# Patient Record
Sex: Male | Born: 1940 | Race: White | Hispanic: No | Marital: Married | Smoking: Former smoker
Health system: Southern US, Community
[De-identification: ages and names within clinical notes are randomized; demographics above are authoritative.]

## PROBLEM LIST (undated history)

## (undated) DIAGNOSIS — Z87442 Personal history of urinary calculi: Secondary | ICD-10-CM

## (undated) DIAGNOSIS — F32A Depression, unspecified: Secondary | ICD-10-CM

## (undated) DIAGNOSIS — I219 Acute myocardial infarction, unspecified: Secondary | ICD-10-CM

## (undated) DIAGNOSIS — I723 Aneurysm of iliac artery: Secondary | ICD-10-CM

## (undated) DIAGNOSIS — E785 Hyperlipidemia, unspecified: Secondary | ICD-10-CM

## (undated) DIAGNOSIS — N200 Calculus of kidney: Secondary | ICD-10-CM

## (undated) DIAGNOSIS — I700 Atherosclerosis of aorta: Secondary | ICD-10-CM

## (undated) DIAGNOSIS — I10 Essential (primary) hypertension: Secondary | ICD-10-CM

## (undated) DIAGNOSIS — K635 Polyp of colon: Secondary | ICD-10-CM

## (undated) DIAGNOSIS — K219 Gastro-esophageal reflux disease without esophagitis: Secondary | ICD-10-CM

## (undated) DIAGNOSIS — H8110 Benign paroxysmal vertigo, unspecified ear: Secondary | ICD-10-CM

## (undated) DIAGNOSIS — I2510 Atherosclerotic heart disease of native coronary artery without angina pectoris: Secondary | ICD-10-CM

## (undated) DIAGNOSIS — F329 Major depressive disorder, single episode, unspecified: Secondary | ICD-10-CM

## (undated) DIAGNOSIS — I714 Abdominal aortic aneurysm, without rupture, unspecified: Secondary | ICD-10-CM

## (undated) HISTORY — PX: CERVICAL DISC SURGERY: SHX588

## (undated) HISTORY — PX: KIDNEY STONE SURGERY: SHX686

## (undated) HISTORY — PX: CARDIAC CATHETERIZATION: SHX172

## (undated) HISTORY — PX: SHOULDER ARTHROSCOPY W/ ROTATOR CUFF REPAIR: SHX2400

## (undated) HISTORY — PX: COLON SURGERY: SHX602

---

## 2004-04-29 ENCOUNTER — Ambulatory Visit: Payer: Self-pay | Admitting: Urology

## 2004-05-23 ENCOUNTER — Ambulatory Visit: Payer: Self-pay | Admitting: Urology

## 2005-04-07 ENCOUNTER — Ambulatory Visit: Payer: Self-pay | Admitting: Gastroenterology

## 2006-12-03 ENCOUNTER — Ambulatory Visit: Payer: Self-pay | Admitting: Internal Medicine

## 2006-12-15 ENCOUNTER — Ambulatory Visit: Payer: Self-pay | Admitting: Urology

## 2008-05-31 ENCOUNTER — Ambulatory Visit: Payer: Self-pay | Admitting: Unknown Physician Specialty

## 2010-11-19 ENCOUNTER — Emergency Department: Payer: Self-pay | Admitting: Internal Medicine

## 2011-09-22 ENCOUNTER — Ambulatory Visit: Payer: Self-pay | Admitting: Unknown Physician Specialty

## 2014-09-25 ENCOUNTER — Ambulatory Visit
Admit: 2014-09-25 | Disposition: A | Discharge status: Home / Self Care | Payer: Self-pay | Attending: Unknown Physician Specialty | Admitting: Unknown Physician Specialty

## 2014-09-27 LAB — SURGICAL PATHOLOGY

## 2014-11-06 ENCOUNTER — Encounter
Admission: RE | Admit: 2014-11-06 | Discharge: 2014-11-06 | Disposition: A | Discharge status: Home / Self Care | Payer: PPO | Source: Ambulatory Visit | Attending: Surgery | Admitting: Surgery

## 2014-11-06 DIAGNOSIS — Z01812 Encounter for preprocedural laboratory examination: Secondary | ICD-10-CM | POA: Diagnosis not present

## 2014-11-06 DIAGNOSIS — D122 Benign neoplasm of ascending colon: Secondary | ICD-10-CM | POA: Insufficient documentation

## 2014-11-06 HISTORY — DX: Polyp of colon: K63.5

## 2014-11-06 HISTORY — DX: Calculus of kidney: N20.0

## 2014-11-06 LAB — BASIC METABOLIC PANEL
ANION GAP: 5 (ref 5–15)
BUN: 21 mg/dL — ABNORMAL HIGH (ref 6–20)
CALCIUM: 8.7 mg/dL — AB (ref 8.9–10.3)
CO2: 24 mmol/L (ref 22–32)
Chloride: 112 mmol/L — ABNORMAL HIGH (ref 101–111)
Creatinine, Ser: 0.78 mg/dL (ref 0.61–1.24)
GFR calc Af Amer: >60 mL/min (ref >60)
GFR calc non Af Amer: >60 mL/min (ref >60)
GLUCOSE: 99 mg/dL (ref 65–99)
POTASSIUM: 3.9 mmol/L (ref 3.5–5.1)
Sodium: 141 mmol/L (ref 135–145)

## 2014-11-06 LAB — DIFFERENTIAL
Basophils Absolute: 0.1 10*3/uL (ref 0–0.1)
Basophils Relative: 1 %
EOS ABS: 0.2 10*3/uL (ref 0–0.7)
Eosinophils Relative: 3 %
LYMPHS ABS: 1.8 10*3/uL (ref 1.0–3.6)
Lymphocytes Relative: 24 %
MONO ABS: 0.7 10*3/uL (ref 0.2–1.0)
MONOS PCT: 10 %
Neutro Abs: 4.5 10*3/uL (ref 1.4–6.5)
Neutrophils Relative %: 62 %

## 2014-11-06 LAB — CBC
HCT: 44.7 % (ref 40.0–52.0)
Hemoglobin: 14.3 g/dL (ref 13.0–18.0)
MCH: 29.3 pg (ref 26.0–34.0)
MCHC: 32.1 g/dL (ref 32.0–36.0)
MCV: 91.5 fL (ref 80.0–100.0)
Platelets: 204 10*3/uL (ref 150–440)
RBC: 4.88 MIL/uL (ref 4.40–5.90)
RDW: 15.9 % — AB (ref 11.5–14.5)
WBC: 7.3 10*3/uL (ref 3.8–10.6)

## 2014-11-06 NOTE — Patient Instructions (Addendum)
  Your procedure is scheduled on: Tuesday 11/14/14 Report to Day Surgery. Medical mall Entrance To find out your arrival time please call [phone] between 1PM - 3PM on Monday 11/13/14.  Remember: Instructions that are not followed completely may result in serious medical risk, up to and including death, or upon the discretion of your surgeon and anesthesiologist your surgery may need to be rescheduled.    __x__ 1. Do not eat food or drink liquids after midnight. No gum chewing or hard candies.     __x__ 2. No Alcohol for 24 hours before or after surgery.   ____ 3. Bring all medications with you on the day of surgery if instructed.    __x__ 4. Notify your doctor if there is any change in your medical condition     (cold, fever, infections).     Do not wear jewelry, make-up, hairpins, clips or nail polish.  Do not wear lotions, powders, or perfumes.   Do not shave 48 hours prior to surgery. Men may shave face and neck.  Do not bring valuables to the hospital.    [facility] is not responsible for any belongings or valuables.               Contacts, dentures or bridgework may not be worn into surgery.  Leave your suitcase in the car. After surgery it may be brought to your room.  For patients admitted to the hospital, discharge time is determined by your                treatment team.   Patients discharged the day of surgery will not be allowed to drive home.   Please read over the following fact sheets that you were given:   Surgical Site Infection Prevention   ____ Take these medicines the morning of surgery with A SIP OF WATER:    1.   2.   3.   4.  5.  6.  ____ Fleet Enema (as directed)   __x__ Use CHG Soap as directed  ____ Use inhalers on the day of surgery  ____ Stop metformin 2 days prior to surgery    ____ Take 1/2 of usual insulin dose the night before surgery and none on the morning of surgery.   ____ Stop Coumadin/Plavix/aspirin on Stopped on  11/05/14  ____ Stop Anti-inflammatories on    ____ Stop supplements until after surgery.    ____ Bring C-Pap to the hospital.

## 2014-11-07 NOTE — OR Nursing (Signed)
FAXED EKG FOR REVIEW TO DR Bethena Midget OFFICE AND SPOKE WITH STAFF ABOUT MD REVIEWING EKG

## 2014-11-14 ENCOUNTER — Ambulatory Visit: Payer: PPO | Admitting: Anesthesiology

## 2014-11-14 ENCOUNTER — Encounter
Admission: RE | Disposition: A | Discharge status: Home / Self Care | Payer: Self-pay | Source: Ambulatory Visit | Attending: Surgery

## 2014-11-14 ENCOUNTER — Encounter: Payer: Self-pay | Admitting: *Deleted

## 2014-11-14 ENCOUNTER — Inpatient Hospital Stay
Admission: RE | Admit: 2014-11-14 | Discharge: 2014-11-18 | DRG: 330 | Disposition: A | Discharge status: Home / Self Care | Payer: PPO | Source: Ambulatory Visit | Attending: Surgery | Admitting: Surgery

## 2014-11-14 DIAGNOSIS — R140 Abdominal distension (gaseous): Secondary | ICD-10-CM | POA: Diagnosis present

## 2014-11-14 DIAGNOSIS — Z7982 Long term (current) use of aspirin: Secondary | ICD-10-CM | POA: Diagnosis not present

## 2014-11-14 DIAGNOSIS — Z87891 Personal history of nicotine dependence: Secondary | ICD-10-CM

## 2014-11-14 DIAGNOSIS — Z9889 Other specified postprocedural states: Secondary | ICD-10-CM

## 2014-11-14 DIAGNOSIS — K567 Ileus, unspecified: Secondary | ICD-10-CM | POA: Diagnosis present

## 2014-11-14 DIAGNOSIS — Z8249 Family history of ischemic heart disease and other diseases of the circulatory system: Secondary | ICD-10-CM | POA: Diagnosis not present

## 2014-11-14 DIAGNOSIS — Z8042 Family history of malignant neoplasm of prostate: Secondary | ICD-10-CM

## 2014-11-14 DIAGNOSIS — Z8041 Family history of malignant neoplasm of ovary: Secondary | ICD-10-CM

## 2014-11-14 DIAGNOSIS — Q899 Congenital malformation, unspecified: Secondary | ICD-10-CM

## 2014-11-14 DIAGNOSIS — R109 Unspecified abdominal pain: Secondary | ICD-10-CM

## 2014-11-14 DIAGNOSIS — K635 Polyp of colon: Secondary | ICD-10-CM | POA: Diagnosis present

## 2014-11-14 DIAGNOSIS — D120 Benign neoplasm of cecum: Secondary | ICD-10-CM | POA: Diagnosis present

## 2014-11-14 HISTORY — PX: LAPAROSCOPIC RIGHT COLECTOMY: SHX5925

## 2014-11-14 LAB — CBC
HCT: 43.5 % (ref 40.0–52.0)
Hemoglobin: 13.9 g/dL (ref 13.0–18.0)
MCH: 28.9 pg (ref 26.0–34.0)
MCHC: 32 g/dL (ref 32.0–36.0)
MCV: 90.4 fL (ref 80.0–100.0)
PLATELETS: 181 10*3/uL (ref 150–440)
RBC: 4.81 MIL/uL (ref 4.40–5.90)
RDW: 14.8 % — AB (ref 11.5–14.5)
WBC: 12 10*3/uL — AB (ref 3.8–10.6)

## 2014-11-14 LAB — CREATININE, SERUM
Creatinine, Ser: 1.1 mg/dL (ref 0.61–1.24)
GFR calc Af Amer: >60 mL/min (ref >60)
GFR calc non Af Amer: >60 mL/min (ref >60)

## 2014-11-14 SURGERY — COLECTOMY, RIGHT, LAPAROSCOPIC
Anesthesia: General | Laterality: Right | Wound class: Clean Contaminated

## 2014-11-14 MED ORDER — EPHEDRINE SULFATE 50 MG/ML IJ SOLN
INTRAMUSCULAR | Status: DC | PRN
  Administered 2014-11-14 (×2): 10 mg via INTRAVENOUS

## 2014-11-14 MED ORDER — ACETAMINOPHEN 10 MG/ML IV SOLN
INTRAVENOUS | Status: DC | PRN
  Administered 2014-11-14: 1000 mg via INTRAVENOUS

## 2014-11-14 MED ORDER — DEXAMETHASONE SODIUM PHOSPHATE 10 MG/ML IJ SOLN
INTRAMUSCULAR | Status: DC | PRN
  Administered 2014-11-14: 10 mg via INTRAVENOUS

## 2014-11-14 MED ORDER — DEXTROSE 5 % IV SOLN
2.0000 g | Freq: Once | INTRAVENOUS | Status: AC
  Administered 2014-11-14: 2 g via INTRAVENOUS
  Filled 2014-11-14: qty 2

## 2014-11-14 MED ORDER — PROPOFOL 10 MG/ML IV BOLUS
INTRAVENOUS | Status: DC | PRN
  Administered 2014-11-14: 150 mg via INTRAVENOUS

## 2014-11-14 MED ORDER — MIDAZOLAM HCL 5 MG/5ML IJ SOLN
INTRAMUSCULAR | Status: DC | PRN
  Administered 2014-11-14: 2 mg via INTRAVENOUS

## 2014-11-14 MED ORDER — ACETAMINOPHEN 650 MG RE SUPP: 650.0000 mg | Freq: Four times a day (QID) | RECTAL | Status: DC | PRN

## 2014-11-14 MED ORDER — FENTANYL CITRATE (PF) 100 MCG/2ML IJ SOLN
25.0000 ug | INTRAMUSCULAR | Status: DC | PRN
  Administered 2014-11-14 (×4): 25 ug via INTRAVENOUS

## 2014-11-14 MED ORDER — ONDANSETRON HCL 4 MG/2ML IJ SOLN: 4.0000 mg | Freq: Once | INTRAMUSCULAR | Status: DC | PRN

## 2014-11-14 MED ORDER — HYDROCODONE-ACETAMINOPHEN 5-325 MG PO TABS
1.0000 | ORAL_TABLET | ORAL | Status: DC | PRN
  Administered 2014-11-15 – 2014-11-16 (×5): 2 via ORAL
  Filled 2014-11-14 (×5): qty 2

## 2014-11-14 MED ORDER — FENTANYL CITRATE (PF) 100 MCG/2ML IJ SOLN
INTRAMUSCULAR | Status: AC
  Administered 2014-11-14: 25 ug via INTRAVENOUS
  Filled 2014-11-14: qty 2

## 2014-11-14 MED ORDER — ROCURONIUM BROMIDE 100 MG/10ML IV SOLN
INTRAVENOUS | Status: DC | PRN
  Administered 2014-11-14: 20 mg via INTRAVENOUS
  Administered 2014-11-14: 50 mg via INTRAVENOUS

## 2014-11-14 MED ORDER — KETOROLAC TROMETHAMINE 30 MG/ML IJ SOLN
15.0000 mg | Freq: Four times a day (QID) | INTRAMUSCULAR | Status: AC
  Administered 2014-11-14 – 2014-11-15 (×3): 15 mg via INTRAVENOUS
  Filled 2014-11-14 (×3): qty 1

## 2014-11-14 MED ORDER — HEPARIN SODIUM (PORCINE) 5000 UNIT/ML IJ SOLN
5000.0000 [IU] | Freq: Two times a day (BID) | INTRAMUSCULAR | Status: DC
  Administered 2014-11-14 – 2014-11-17 (×8): 5000 [IU] via SUBCUTANEOUS
  Filled 2014-11-14 (×8): qty 1

## 2014-11-14 MED ORDER — PNEUMOCOCCAL VAC POLYVALENT 25 MCG/0.5ML IJ INJ: 0.5000 mL | INJECTION | INTRAMUSCULAR | Status: DC

## 2014-11-14 MED ORDER — FENTANYL CITRATE (PF) 250 MCG/5ML IJ SOLN
INTRAMUSCULAR | Status: DC | PRN
  Administered 2014-11-14 (×2): 50 ug via INTRAVENOUS

## 2014-11-14 MED ORDER — ACETAMINOPHEN 325 MG PO TABS
650.0000 mg | ORAL_TABLET | Freq: Four times a day (QID) | ORAL | Status: DC | PRN
  Administered 2014-11-16 – 2014-11-17 (×5): 650 mg via ORAL
  Filled 2014-11-14 (×5): qty 2

## 2014-11-14 MED ORDER — LACTATED RINGERS IV SOLN
INTRAVENOUS | Status: DC
  Administered 2014-11-14 (×3): via INTRAVENOUS

## 2014-11-14 MED ORDER — KETOROLAC TROMETHAMINE 30 MG/ML IJ SOLN
30.0000 mg | Freq: Four times a day (QID) | INTRAMUSCULAR | Status: DC
  Administered 2014-11-14: 30 mg via INTRAVENOUS
  Filled 2014-11-14: qty 1

## 2014-11-14 MED ORDER — ONDANSETRON HCL 4 MG/2ML IJ SOLN
INTRAMUSCULAR | Status: DC | PRN
  Administered 2014-11-14: 4 mg via INTRAVENOUS

## 2014-11-14 MED ORDER — LIDOCAINE HCL (CARDIAC) 20 MG/ML IV SOLN
INTRAVENOUS | Status: DC | PRN
  Administered 2014-11-14: 80 mg via INTRAVENOUS

## 2014-11-14 MED ORDER — KCL IN DEXTROSE-NACL 20-5-0.2 MEQ/L-%-% IV SOLN
INTRAVENOUS | Status: DC
  Administered 2014-11-14 – 2014-11-17 (×7): via INTRAVENOUS
  Filled 2014-11-14 (×9): qty 1000

## 2014-11-14 MED ORDER — MORPHINE SULFATE 2 MG/ML IJ SOLN
1.0000 mg | INTRAMUSCULAR | Status: DC | PRN
  Administered 2014-11-14 – 2014-11-15 (×4): 2 mg via INTRAVENOUS
  Filled 2014-11-14 (×4): qty 1

## 2014-11-14 MED ORDER — ACETAMINOPHEN 10 MG/ML IV SOLN
INTRAVENOUS | Status: AC
  Filled 2014-11-14: qty 100

## 2014-11-14 SURGICAL SUPPLY — 58 items
BLADE SURG SZ10 CARB STEEL (BLADE) ×6 IMPLANT
CANISTER SUCT 1200ML W/VALVE (MISCELLANEOUS) ×3 IMPLANT
CANNULA DILATOR 12 W/SLV (CANNULA) ×2 IMPLANT
CANNULA DILATOR 12MM W/SLV (CANNULA) ×1
CATH TRAY 16F METER LATEX (MISCELLANEOUS) IMPLANT
CHLORAPREP W/TINT 26ML (MISCELLANEOUS) ×3 IMPLANT
CLEANER CAUTERY TIP 5X5 PAD (MISCELLANEOUS) ×1 IMPLANT
CLIP TI LARGE 6 (CLIP) IMPLANT
CLIP TI MEDIUM 6 (CLIP) IMPLANT
CLOSURE WOUND 1/2 X4 (GAUZE/BANDAGES/DRESSINGS)
DRAPE LEGGINS SURG 28X43 STRL (DRAPES) IMPLANT
DRAPE UNDER BUTTOCK W/FLU (DRAPES) IMPLANT
DRSG OPSITE POSTOP 4X10 (GAUZE/BANDAGES/DRESSINGS) IMPLANT
DRSG OPSITE POSTOP 4X8 (GAUZE/BANDAGES/DRESSINGS) IMPLANT
GAUZE SPONGE 4X4 12PLY STRL (GAUZE/BANDAGES/DRESSINGS) ×3 IMPLANT
GLOVE BIO SURGEON STRL SZ7.5 (GLOVE) ×24 IMPLANT
GOWN STRL REUS W/ TWL LRG LVL3 (GOWN DISPOSABLE) ×6 IMPLANT
GOWN STRL REUS W/TWL LRG LVL3 (GOWN DISPOSABLE) ×12
HANDLE YANKAUER SUCT BULB TIP (MISCELLANEOUS) ×6 IMPLANT
IV NS 1000ML (IV SOLUTION)
IV NS 1000ML BAXH (IV SOLUTION) IMPLANT
JELLY LUB 2OZ STRL (MISCELLANEOUS)
JELLY LUBE 2OZ STRL (MISCELLANEOUS) IMPLANT
KIT RM TURNOVER STRD PROC AR (KITS) ×3 IMPLANT
LABEL OR SOLS (LABEL) IMPLANT
NDL INSUFF ACCESS 14 VERSASTEP (NEEDLE) ×3 IMPLANT
NS IRRIG 500ML POUR BTL (IV SOLUTION) ×3 IMPLANT
PACK COLON CLEAN CLOSURE (MISCELLANEOUS) ×3 IMPLANT
PACK LAP CHOLECYSTECTOMY (MISCELLANEOUS) ×3 IMPLANT
PAD CLEANER CAUTERY TIP 5X5 (MISCELLANEOUS) ×2
PAD GROUND ADULT SPLIT (MISCELLANEOUS) ×3 IMPLANT
PAD PREP 24X41 OB/GYN DISP (PERSONAL CARE ITEMS) IMPLANT
PENCIL ELECTRO HAND CTR (MISCELLANEOUS) ×6 IMPLANT
RELOAD PROXIMATE 75MM BLUE (ENDOMECHANICALS) ×3 IMPLANT
SCISSORS METZENBAUM CVD 33 (INSTRUMENTS) IMPLANT
SEAL FOR SCOPE WARMER C3101 (MISCELLANEOUS) IMPLANT
SHEARS HARMONIC ACE PLUS 36CM (ENDOMECHANICALS) ×3 IMPLANT
SOL PREP PVP 2OZ (MISCELLANEOUS)
SOLUTION PREP PVP 2OZ (MISCELLANEOUS) IMPLANT
SPONGE LAP 18X18 5 PK (GAUZE/BANDAGES/DRESSINGS) ×3 IMPLANT
STAPLER AUT SUT LDS 15W (STAPLE) IMPLANT
STAPLER GUN LINEAR PROX 60 (STAPLE) ×3 IMPLANT
STAPLER PROXIMATE 75MM BLUE (STAPLE) ×3 IMPLANT
STRIP CLOSURE SKIN 1/2X4 (GAUZE/BANDAGES/DRESSINGS) IMPLANT
SUT CHROMIC 0 SH (SUTURE) ×3 IMPLANT
SUT CHROMIC 3 0 SH 27 (SUTURE) ×6 IMPLANT
SUT ETHILON 4-0 (SUTURE) ×6
SUT ETHILON 4-0 FS2 18XMFL BLK (SUTURE) ×3
SUT MAXON ABS #0 GS21 30IN (SUTURE) ×6 IMPLANT
SUT NYLON 2-0 (SUTURE) IMPLANT
SUT PROLENE 3 0 SH DA (SUTURE) IMPLANT
SUT VIC AB 5-0 RB1 27 (SUTURE) ×3 IMPLANT
SUTURE ETHLN 4-0 FS2 18XMF BLK (SUTURE) ×3 IMPLANT
TROCAR XCEL NON-BLD 11X100MML (ENDOMECHANICALS) ×3 IMPLANT
TROCAR XCEL NON-BLD 5MMX100MML (ENDOMECHANICALS) ×3 IMPLANT
TROCAR XCEL UNIV SLVE 11M 100M (ENDOMECHANICALS) IMPLANT
TUBING INSUFFLATOR HEATED (MISCELLANEOUS) ×3 IMPLANT
WATER STERILE IRR 1000ML POUR (IV SOLUTION) IMPLANT

## 2014-11-14 NOTE — Op Note (Signed)
OPERATIVE REPORT  PREOPERATIVE  DIAGNOSIS: . Colonic polyp  POSTOPERATIVE DIAGNOSIS: . Colonic polyp  PROCEDURE: . Laparoscopic right colectomy  ANESTHESIA:  General  SURGEON: Rochel Brome  MD   INDICATIONS: . This 74 year old male recently had colonoscopy with finding of 2 polyps in the cecum. Biopsy demonstrated tubular adenoma surgery was recommended for definitive treatment  With the patient on the operating table in the supine position he was placed under general endotracheal anesthesia. Abdomen was prepared with clippers and with ChloraPrep and draped in a sterile manner.  A short incision was made below the umbilicus and carried down to the deep fascia which was grasped with a laryngeal hook and elevated. A Veress needle was inserted aspirated and irrigated with a saline solution. The peritoneal cavity was insufflated with carbon dioxide. The Veress needle was removed. The 12 mm cannula was inserted. The 10 mm 0 laparoscope was inserted to view the peritoneal cavity. Initial inspection revealed normal appearance of the liver.  2 additional small incisions were made in the epigastrium to insert 10 mm ports. Another incision was made in the right lower quadrant tenderness a 5 mm port. The graft the right colon was mobilized with incision of the peritoneal reflection using the Harmonic scalpel. The right transverse colon was also dissected and mobilized the duodenum was identified and separated from the transverse mesocolon. The appendix and terminal ileum were also mobilized. After satisfactory mobilization of the right colon the laparoscopic instruments were removed. The right lower quadrant and the infraumbilical port sites were closed with interrupted 4-0 nylon vertical mattress sutures. An incision was made from the uppermost port site to the other epigastric port site and dissection was carried down through subcutaneous tissues and through the midline fascia. The right colon was  delivered up on the abdominal wall. A window was created in the small bowel mesentery just approximately 3 inches proximal to the ileocecal valve and the mesenteric dissection was begun with the Harmonic scalpel. The middle colic vessels were palpated and created a small opening in the mesentery just to the right of the midline and began the mesenteric dissection with the Harmonic scalpel. The mesenteric dissection was continued and dissected out the ileocolic vessels which were suture ligated with 0 chromic and divided with the Harmonic scalpel. The small bowel was brought adjacent to the transverse colon and tented up with Allis clamps an enterotomy was made and also a colotomy made. The anastomosis was begun with introduction of the GIA 75 mm stapler which was engaged and activated along the antimesenteric border. Staple line was hemostatic. The anastomosis was completed with application of the TA 60 stapler which was placed perpendicular to the first staple line engaged and activated. The specimen was excised and passed off to a side table. The staple line was examined 1 bleeding point was ligated with 3-0 chromic. Hemostasis was surgically intact. The mesenteric defect was closed with running 3-0 chromic the apex of the staple line was  imbricated with 5-0 Vicryl and the junction of the staple lines was imbricated with 5-0 Vicryl. The anastomosis was widely patent and was placed back into the peritoneal cavity the right colic gutter was suctioned and found to contain only a small amount of serosanguineous fluid. Hemostasis was intact.  Gloves gowns and instruments and drapes were exchanged for new ones the midline fascia was closed with interrupted 0 Maxon figure-of-eight sutures. Skin incisions were closed with interrupted 4-0 nylon vertical mattress sutures. Dry cotton gauze dressings were applied with  paper tape.  The patient tolerated the procedure satisfactorily and was then prepared for transfer to  the recovery room.  Rochel Brome M.D.

## 2014-11-14 NOTE — Anesthesia Preprocedure Evaluation (Signed)
Anesthesia Evaluation  Patient identified by MRN, date of birth, ID band Patient awake    Reviewed: Allergy & Precautions, NPO status , Patient's Chart, lab work & pertinent test results  History of Anesthesia Complications Negative for: history of anesthetic complications  Airway Mallampati: II  TM Distance: >3 FB Neck ROM: Full    Dental  (+) Upper Dentures, Lower Dentures   Pulmonary former smoker (quit x 20 yrs),          Cardiovascular     Neuro/Psych    GI/Hepatic   Endo/Other    Renal/GU Renal disease (stones)     Musculoskeletal   Abdominal   Peds  Hematology   Anesthesia Other Findings   Reproductive/Obstetrics                             Anesthesia Physical Anesthesia Plan  ASA: II  Anesthesia Plan: General   Post-op Pain Management:    Induction: Intravenous  Airway Management Planned: Oral ETT  Additional Equipment:   Intra-op Plan:   Post-operative Plan:   Informed Consent: I have reviewed the patients History and Physical, chart, labs and discussed the procedure including the risks, benefits and alternatives for the proposed anesthesia with the patient or authorized representative who has indicated his/her understanding and acceptance.     Plan Discussed with:   Anesthesia Plan Comments:         Anesthesia Quick Evaluation

## 2014-11-14 NOTE — Progress Notes (Signed)
Let nurse know 

## 2014-11-14 NOTE — Anesthesia Postprocedure Evaluation (Signed)
  Anesthesia Post-op Note  Patient: Richard Rivas  Procedure(s) Performed: Procedure(s): LAPAROSCOPIC RIGHT COLECTOMY (Right)  Anesthesia type:General  Patient location: PACU  Post pain: Pain level controlled  Post assessment: Post-op Vital signs reviewed, Patient's Cardiovascular Status Stable, Respiratory Function Stable, Patent Airway and No signs of Nausea or vomiting  Post vital signs: Reviewed and stable  Last Vitals:  Filed Vitals:   11/14/14 1148  BP: 129/64  Pulse: 76  Temp: 36.6 C  Resp:     Level of consciousness: awake, alert  and patient cooperative  Complications: No apparent anesthesia complications

## 2014-11-14 NOTE — H&P (Signed)
  He reports no change in condition since the day of the office visit. He did have his bowel preparation and reports good progress. Reviewed his finding of colon polyps. I discussed the plan for right colectomy.  Rochel Brome M.D. 11/14/2014

## 2014-11-14 NOTE — Transfer of Care (Signed)
Immediate Anesthesia Transfer of Care Note  Patient: Richard Rivas  Procedure(s) Performed: Procedure(s): LAPAROSCOPIC RIGHT COLECTOMY (Right)  Patient Location: PACU  Anesthesia Type:General  Level of Consciousness: awake, alert  and oriented  Airway & Oxygen Therapy: Patient Spontanous Breathing and Patient connected to face mask oxygen  Post-op Assessment: Report given to RN and Post -op Vital signs reviewed and stable  Post vital signs: Reviewed and stable  Last Vitals:  Filed Vitals:   11/14/14 1032  BP:   Pulse:   Temp: 36.4 C  Resp:     Complications: No apparent anesthesia complications

## 2014-11-14 NOTE — Anesthesia Procedure Notes (Signed)
Procedure Name: Intubation Date/Time: 11/14/2014 7:42 AM Performed by: Delaney Meigs Pre-anesthesia Checklist: Patient identified, Patient being monitored, Timeout performed, Emergency Drugs available and Suction available Patient Re-evaluated:Patient Re-evaluated prior to inductionOxygen Delivery Method: Circle system utilized Preoxygenation: Pre-oxygenation with 100% oxygen Intubation Type: IV induction Ventilation: Mask ventilation without difficulty Laryngoscope Size: Mac and 3 Grade View: Grade I Tube type: Oral Tube size: 7.5 mm Number of attempts: 1 Airway Equipment and Method: Stylet Placement Confirmation: ETT inserted through vocal cords under direct vision,  positive ETCO2 and breath sounds checked- equal and bilateral Secured at: 21 cm Tube secured with: Tape Dental Injury: Teeth and Oropharynx as per pre-operative assessment

## 2014-11-14 NOTE — Progress Notes (Signed)
He is doing well, up walking, no current pain,  VSS, dressing with scant serosanguinous, discussed surgery and plan of care.  Plan to start sipping clear liquids tonight.

## 2014-11-14 NOTE — Progress Notes (Signed)
Pt admitted from the PACU. Dressings dry and intact. Pt resting well. Given PRN pain meds once. Pt voiding in urinal. Ambulating up in room with assistance. Primary nurse to continue to monitor.

## 2014-11-15 ENCOUNTER — Inpatient Hospital Stay: Payer: PPO

## 2014-11-15 NOTE — Care Management (Signed)
Attempted to meet with this patient but he was having severe pain; RN had been notified per patient's family.

## 2014-11-15 NOTE — Progress Notes (Signed)
I have seen him 3 times a day.  He did start some clear liquids last night.  He developed abdominal pain during the night.  He was treated with morphine.  The pain eventually got better.  This morning he had some abdominal distention and tympany and hyperactive bowel sounds.  His midline incision was with minimal amount of serosanguineous drainage unchanged from last night   He did report passing flatus. He did have another episode of pain which was treated with morphine.  I saw him later in the day and elected to do an x-ray.  I reviewed the x-ray images which were consistent with ileus with gas seen in the small and mostly in the large bowel.  Vital signs noted  On examination this evening his abdomen is softer with less tenderness.  Impression abdominal pain associated with excess gas  Plan is to just take sips of water.  Continue IV fluid.  Continue walking

## 2014-11-16 LAB — SURGICAL PATHOLOGY

## 2014-11-16 NOTE — Progress Notes (Signed)
Patient ID: Richard Rivas, male   DOB: [birth year], 74 y.o.   MRN: [id number] In early afternoon he reported improvement with less pain.  He has been tolerating some clear liquids.  Abdomen is soft and nontender.  Plan is to advance to full liquids for evening meal.  Taper IV.

## 2014-11-16 NOTE — Progress Notes (Signed)
He reports improvement today with less pain.  He has been walking early this morning.  He has been passing gas per rectum.  Vital signs are stable  The abdomen is with minimal distention some tympany soft with no significant tenderness.  His dressings were changed.  His wounds are healing satisfactorily.  Plan is to start clear liquids, continue frequent walking.  He is not yet ready to go home.  Anticipate may need 2 more days in the hospital to demonstrate satisfactory oral intake and bowel function.

## 2014-11-17 NOTE — Progress Notes (Signed)
Patient ID: Richard Rivas, male   DOB: [birth year], 74 y.o.   MRN: [id number] This is postop day 3 after laparoscopic right colectomy.  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  Twelve lymph glands were normal.  He reports progress with oral intake now beginning solid food.  He has had 3 bowel movements this morning.  He is passing flatus.  He is walking in the hallway.  His now taking Tylenol as needed for pain.  This maximal temperature is 99.  On examination his abdomen is soft.  There is minimal tenderness on the right side.  His dressings are dry and intact.  Impressions satisfactory progress 3 days after laparoscopic right colectomy with benign findings of tubular adenomas.  Anticipate he may can be discharged tomorrow.  Resume aspirin upon discharge.  Anticipate he can come to the office  in 7 days for suture removal.  Follow-up in the office with me in approximately 3 weeks.

## 2014-11-17 NOTE — Care Management (Signed)
Diet is being advanced to regular

## 2014-11-17 NOTE — Progress Notes (Addendum)
Initial Nutrition Assessment       INTERVENTION:   (Nutrition Supplement Therapy:) Will add homemade milkshake TID for added nutrition per pt request Meals and snacks: Adding pudding to trays as well  NUTRITION DIAGNOSIS:  Inadequate oral intake related to altered GI function as evidenced by meal completion < 50%.    GOAL:  Patient will meet greater than or equal to 90% of their needs    MONITOR:   (Energy intake, Digestive system)  REASON FOR ASSESSMENT:  NPO/Clear Liquid Diet    ASSESSMENT:  Pt s/p right colectomy.  PMHx:  Past Medical History  Diagnosis Date  . Colon polyps   . Kidney stones     Current Nutrition: taking sips of liquids per wife. Pt in bathroom this am.  Wife requesting homemade milkshake   Food/Nutrition-Related History: Wife reports intake fairly normal prior to admission but several days prior to admission just had liquids for colon cleanse prior to surgery.    Labs:  Electrolyte and Renal Profile:  Recent Labs Lab 11/14/14 1228  CREATININE 1.10    Medications: D5 0.2% NS with KCL at 15ml/hr   Digestive system: noted BM on 6/13, + gas per wife, abdomin minimally distended   Physical Findings:  Unable to complete Nutrition-Focused physical exam at this time. Pt in bathroom on visit this am  Weight Change: Wife reports UBW of 167-168 pounds   Height:  Ht Readings from Last 1 Encounters:  11/14/14 5\' 8"  (1.727 m)    Weight:  Wt Readings from Last 1 Encounters:  11/14/14 170 lb (77.111 kg)     Wt Readings from Last 10 Encounters:  11/14/14 170 lb (77.111 kg)  11/06/14 170 lb (77.111 kg)    BMI:  Body mass index is 25.85 kg/(m^2).  Estimated Nutritional Needs:  Kcal:  BEE 1499 kcals (IF 1.0-1.2, AF 1.3) 0175-1025 kcals/d.   Protein:  (1.0-1.2 g/d) 77-92 g/d  Fluid:  (25-30ml/kg) 1925-2368ml/d  Skin:  Reviewed, no issues  Diet Order:  Diet full liquid Room service appropriate?: Yes; Fluid  consistency:: Thin  EDUCATION NEEDS:  No education needs identified at this time   Intake/Output Summary (Last 24 hours) at 11/17/14 0811 Last data filed at 11/17/14 0500  Gross per 24 hour  Intake   2254 ml  Output   2100 ml  Net    154 ml    MODERATE Care Level  Verdell Dykman B. Zenia Resides, [place], [place] (pager)

## 2014-11-18 LAB — PLATELET COUNT: Platelets: 194 10*3/uL (ref 150–440)

## 2014-11-18 MED ORDER — HYDROCODONE-ACETAMINOPHEN 5-325 MG PO TABS: 1.0000 | ORAL_TABLET | ORAL | Status: DC | PRN

## 2014-11-18 NOTE — Discharge Instructions (Signed)
OK to shower. No lifting over 10 pounds.  No driving until pain free. Use spirometer frequently. Tylenol: If needed for soreness. Norco (hydrocodone): If needed for pain.

## 2014-11-18 NOTE — Progress Notes (Signed)
Pt d/c instructions given. Education provided. Questions answered. IV removed. Escorted out with family.

## 2014-11-27 NOTE — Discharge Summary (Signed)
Discharge summary:   This 74 year old male recently had colonoscopy on September 25, 2014.  Two polyps were found in the cecum wound was small and 1 was partially resected.  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    Details of the history and physical were found on the typed admission history and physical   The patient did have bowel preparation at home.  He came in through the outpatient surgery department and was carried to the operating room and did have a preop prophylactic antibiotic.  Postoperatively he was began on sips of clear liquids.  He did develop some abdominal distention and abdominal pain.  He quickly to increase his walking.  And with time begin to passing gas.  X-ray was consistent with ileus.  Eventually started passing more gas and has abdominal distention resolved.  He was able to take a clear liquid and advanced to full liquids and then to solid food.  He tolerated a solid diet and moved his bowels prior to discharge.    Pathology demonstrated 2 sessile tubular adenomas.  One was 2.1 cm in dimension and the other was 9 mm in dimension.  The 12th regional mesenteric lymph nodes were benign.    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    Operation laparoscopic right colectomy   Discharge instructions include follow up in the office

## 2015-01-01 DEATH — deceased

## 2015-09-10 DIAGNOSIS — Z8041 Family history of malignant neoplasm of ovary: Secondary | ICD-10-CM | POA: Diagnosis not present

## 2015-09-10 DIAGNOSIS — Z86010 Personal history of colonic polyps: Secondary | ICD-10-CM | POA: Diagnosis not present

## 2015-09-17 DIAGNOSIS — Z0000 Encounter for general adult medical examination without abnormal findings: Secondary | ICD-10-CM | POA: Diagnosis not present

## 2015-09-17 DIAGNOSIS — Z125 Encounter for screening for malignant neoplasm of prostate: Secondary | ICD-10-CM | POA: Diagnosis not present

## 2015-10-10 ENCOUNTER — Encounter: Payer: Self-pay | Admitting: *Deleted

## 2015-10-11 ENCOUNTER — Encounter: Payer: Self-pay | Admitting: Anesthesiology

## 2015-10-11 ENCOUNTER — Ambulatory Visit: Payer: PPO | Admitting: Anesthesiology

## 2015-10-11 ENCOUNTER — Encounter
Admission: RE | Disposition: A | Discharge status: Home / Self Care | Payer: Self-pay | Source: Ambulatory Visit | Attending: Unknown Physician Specialty

## 2015-10-11 ENCOUNTER — Ambulatory Visit
Admission: RE | Admit: 2015-10-11 | Discharge: 2015-10-11 | Disposition: A | Discharge status: Home / Self Care | Payer: PPO | Source: Ambulatory Visit | Attending: Unknown Physician Specialty | Admitting: Unknown Physician Specialty

## 2015-10-11 DIAGNOSIS — Z7982 Long term (current) use of aspirin: Secondary | ICD-10-CM | POA: Insufficient documentation

## 2015-10-11 DIAGNOSIS — Z87891 Personal history of nicotine dependence: Secondary | ICD-10-CM | POA: Insufficient documentation

## 2015-10-11 DIAGNOSIS — Z91030 Bee allergy status: Secondary | ICD-10-CM | POA: Diagnosis not present

## 2015-10-11 DIAGNOSIS — Z86010 Personal history of colonic polyps: Secondary | ICD-10-CM | POA: Insufficient documentation

## 2015-10-11 DIAGNOSIS — N402 Nodular prostate without lower urinary tract symptoms: Secondary | ICD-10-CM | POA: Diagnosis not present

## 2015-10-11 DIAGNOSIS — D123 Benign neoplasm of transverse colon: Secondary | ICD-10-CM | POA: Insufficient documentation

## 2015-10-11 DIAGNOSIS — H8110 Benign paroxysmal vertigo, unspecified ear: Secondary | ICD-10-CM | POA: Diagnosis not present

## 2015-10-11 DIAGNOSIS — K635 Polyp of colon: Secondary | ICD-10-CM | POA: Diagnosis not present

## 2015-10-11 DIAGNOSIS — K621 Rectal polyp: Secondary | ICD-10-CM | POA: Diagnosis not present

## 2015-10-11 DIAGNOSIS — Z1211 Encounter for screening for malignant neoplasm of colon: Secondary | ICD-10-CM | POA: Diagnosis not present

## 2015-10-11 DIAGNOSIS — K648 Other hemorrhoids: Secondary | ICD-10-CM | POA: Diagnosis not present

## 2015-10-11 DIAGNOSIS — K640 First degree hemorrhoids: Secondary | ICD-10-CM | POA: Diagnosis not present

## 2015-10-11 DIAGNOSIS — Z87442 Personal history of urinary calculi: Secondary | ICD-10-CM | POA: Diagnosis not present

## 2015-10-11 DIAGNOSIS — Z980 Intestinal bypass and anastomosis status: Secondary | ICD-10-CM | POA: Diagnosis not present

## 2015-10-11 HISTORY — PX: COLONOSCOPY WITH PROPOFOL: SHX5780

## 2015-10-11 HISTORY — DX: Benign paroxysmal vertigo, unspecified ear: H81.10

## 2015-10-11 SURGERY — COLONOSCOPY WITH PROPOFOL
Anesthesia: General

## 2015-10-11 MED ORDER — PHENYLEPHRINE HCL 10 MG/ML IJ SOLN
INTRAMUSCULAR | Status: DC | PRN
  Administered 2015-10-11: 50 ug via INTRAVENOUS

## 2015-10-11 MED ORDER — EPHEDRINE SULFATE 50 MG/ML IJ SOLN
INTRAMUSCULAR | Status: DC | PRN
  Administered 2015-10-11: 5 mg via INTRAVENOUS

## 2015-10-11 MED ORDER — SODIUM CHLORIDE 0.9 % IV SOLN: INTRAVENOUS | Status: DC

## 2015-10-11 MED ORDER — MIDAZOLAM HCL 5 MG/5ML IJ SOLN
INTRAMUSCULAR | Status: DC | PRN
  Administered 2015-10-11: 1 mg via INTRAVENOUS

## 2015-10-11 MED ORDER — PROPOFOL 10 MG/ML IV BOLUS
INTRAVENOUS | Status: DC | PRN
  Administered 2015-10-11: 60 mg via INTRAVENOUS

## 2015-10-11 MED ORDER — FENTANYL CITRATE (PF) 100 MCG/2ML IJ SOLN
INTRAMUSCULAR | Status: DC | PRN
  Administered 2015-10-11: 50 ug via INTRAVENOUS

## 2015-10-11 MED ORDER — PROPOFOL 500 MG/50ML IV EMUL
INTRAVENOUS | Status: DC | PRN
  Administered 2015-10-11: 120 ug/kg/min via INTRAVENOUS

## 2015-10-11 MED ORDER — LIDOCAINE HCL (CARDIAC) 20 MG/ML IV SOLN
INTRAVENOUS | Status: DC | PRN
  Administered 2015-10-11: 60 mg via INTRAVENOUS

## 2015-10-11 MED ORDER — SODIUM CHLORIDE 0.9 % IV SOLN
INTRAVENOUS | Status: DC
  Administered 2015-10-11: 1000 mL via INTRAVENOUS

## 2015-10-11 NOTE — Transfer of Care (Signed)
Immediate Anesthesia Transfer of Care Note  Patient: Richard Rivas  Procedure(s) Performed: Procedure(s): COLONOSCOPY WITH PROPOFOL (N/A)  Patient Location: PACU  Anesthesia Type:General  Level of Consciousness: sedated  Airway & Oxygen Therapy: Patient Spontanous Breathing and Patient connected to nasal cannula oxygen  Post-op Assessment: Report given to RN  Post vital signs: Reviewed and stable  Last Vitals:  Filed Vitals:   10/11/15 1340 10/11/15 1544  BP: 124/75 85/59  Pulse: 78 49  Temp: 35.8 C 97.42F  Resp: 20 16    Last Pain: There were no vitals filed for this visit.       Complications: No apparent anesthesia complications

## 2015-10-11 NOTE — Op Note (Signed)
[facility] Gastroenterology Patient Name: Richard Rivas Procedure Date: 10/11/2015 3:09 PM MRN: [id number] Account #: [id number] Date of Birth: 05-Mar-1941 Admit Type: Outpatient Age: 75 Room: [facility] ENDO ROOM 1 Gender: Male Note Status: Finalized Procedure:            Colonoscopy Indications:          High risk colon cancer surveillance: Personal history                        of colonic polyps Providers:            Manya Silvas, MD Referring MD:         Rusty Aus, MD (Referring MD) Medicines:            Propofol per Anesthesia Complications:        No immediate complications. Procedure:            Pre-Anesthesia Assessment:                       - After reviewing the risks and benefits, the patient                        was deemed in satisfactory condition to undergo the                        procedure.                       After obtaining informed consent, the colonoscope was                        passed under direct vision. Throughout the procedure,                        the patient's blood pressure, pulse, and oxygen                        saturations were monitored continuously. The                        Colonoscope was introduced through the anus and                        advanced to the the ileocolonic anastomosis. The                        colonoscopy was performed without difficulty. The                        patient tolerated the procedure well. The quality of                        the bowel preparation was good. Findings:      The anastamosis looks great with wide open junction.      A diminutive polyp was found in the transverse colon. The polyp was       sessile. The polyp was removed with a jumbo cold forceps. Resection and       retrieval were complete.      Two sessile polyps were found in the rectum. The polyps were diminutive       in size.  These polyps were removed with a jumbo cold forceps. Resection       and retrieval were  complete.      Internal hemorrhoids were found during endoscopy. The hemorrhoids were       small and Grade I (internal hemorrhoids that do not prolapse).      The exam was otherwise without abnormality. Impression:           - One diminutive polyp in the transverse colon, removed                        with a jumbo cold forceps. Resected and retrieved.                       - Two diminutive polyps in the rectum, removed with a                        jumbo cold forceps. Resected and retrieved.                       - Internal hemorrhoids.                       - The examination was otherwise normal. Recommendation:       - Await pathology results. Manya Silvas, MD 10/11/2015 3:38:42 PM This report has been signed electronically. Number of Addenda: 0 Note Initiated On: 10/11/2015 3:09 PM Scope Withdrawal Time: 0 hours 14 minutes 20 seconds  Total Procedure Duration: 0 hours 20 minutes 37 seconds       [facility]

## 2015-10-11 NOTE — Anesthesia Preprocedure Evaluation (Signed)
Anesthesia Evaluation  Patient identified by MRN, date of birth, ID band Patient awake    Reviewed: Allergy & Precautions, NPO status , Patient's Chart, lab work & pertinent test results  History of Anesthesia Complications Negative for: history of anesthetic complications  Airway Mallampati: I  TM Distance: >3 FB Neck ROM: Full    Dental  (+) Upper Dentures, Lower Dentures   Pulmonary neg pulmonary ROS, former smoker,           Cardiovascular Exercise Tolerance: Good negative cardio ROS       Neuro/Psych negative neurological ROS     GI/Hepatic negative GI ROS, Neg liver ROS,   Endo/Other  negative endocrine ROS  Renal/GU Renal disease (stones)  negative genitourinary   Musculoskeletal   Abdominal   Peds  Hematology negative hematology ROS (+)   Anesthesia Other Findings Past Medical History:   Colon polyps                                                 Kidney stones                                                BPPV (benign paroxysmal positional vertigo)                  Reproductive/Obstetrics negative OB ROS                             Anesthesia Physical  Anesthesia Plan  ASA: II  Anesthesia Plan: General   Post-op Pain Management:    Induction: Intravenous  Airway Management Planned: Oral ETT  Additional Equipment:   Intra-op Plan:   Post-operative Plan:   Informed Consent: I have reviewed the patients History and Physical, chart, labs and discussed the procedure including the risks, benefits and alternatives for the proposed anesthesia with the patient or authorized representative who has indicated his/her understanding and acceptance.     Plan Discussed with: Anesthesiologist, CRNA and Surgeon  Anesthesia Plan Comments:         Anesthesia Quick Evaluation

## 2015-10-11 NOTE — H&P (Signed)
   Primary Care Physician:  Rusty Aus, MD Primary Gastroenterologist:  Dr. Vira Agar  Pre-Procedure History & Physical: HPI:  Richard Rivas is a 75 y.o. male is here for an colonoscopy.   Past Medical History  Diagnosis Date  . Colon polyps   . Kidney stones   . BPPV (benign paroxysmal positional vertigo)     Past Surgical History  Procedure Laterality Date  . Shoulder arthroscopy w/ rotator cuff repair Right   . Kidney stone surgery    . Cervical disc surgery    . Laparoscopic right colectomy Right 11/14/2014    Procedure: LAPAROSCOPIC RIGHT COLECTOMY;  Surgeon: Leonie Green, MD;  Location: [facility] ORS;  Service: General;  Laterality: Right;    Prior to Admission medications   Medication Sig Start Date End Date Taking? Authorizing Provider  aspirin EC 81 MG tablet Take by mouth daily. 2 tabs daily    Historical Provider, MD    Allergies as of 10/01/2015 - Review Complete 11/14/2014  Allergen Reaction Noted  . Bee venom Swelling 11/06/2014    History reviewed. No pertinent family history.  Social History   Social History  . Marital Status: Married    Spouse Name: N/A  . Number of Children: N/A  . Years of Education: N/A   Occupational History  . Not on file.   Social History Main Topics  . Smoking status: Former Smoker    Quit date: 11/06/1994  . Smokeless tobacco: Former Systems developer    Quit date: 11/05/2012  . Alcohol Use: Yes     Comment: beer occasionally  . Drug Use: No  . Sexual Activity: Not on file   Other Topics Concern  . Not on file   Social History Narrative    Review of Systems: See HPI, otherwise negative ROS  Physical Exam: BP 124/75 mmHg  Pulse 78  Temp(Src) 96.5 F (35.8 C) (Tympanic)  Resp 20  Ht 5\' 8"  (1.727 m)  Wt 74.844 kg (165 lb)  BMI 25.09 kg/m2  SpO2 97% General:   Alert,  pleasant and cooperative in NAD Head:  Normocephalic and atraumatic. Neck:  Supple; no masses or thyromegaly. Lungs:  Clear throughout to  auscultation.    Heart:  Regular rate and rhythm. Abdomen:  Soft, nontender and nondistended. Normal bowel sounds, without guarding, and without rebound.   Neurologic:  Alert and  oriented x4;  grossly normal neurologically.  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  Questions have been answered.  All parties agreeable.   Gaylyn Cheers, MD  10/11/2015, 3:09 PM

## 2015-10-11 NOTE — Anesthesia Postprocedure Evaluation (Signed)
Anesthesia Post Note  Patient: Richard Rivas  Procedure(s) Performed: Procedure(s) (LRB): COLONOSCOPY WITH PROPOFOL (N/A)  Patient location during evaluation: Endoscopy Anesthesia Type: General Level of consciousness: awake and alert Pain management: pain level controlled Vital Signs Assessment: post-procedure vital signs reviewed and stable Respiratory status: spontaneous breathing and respiratory function stable Cardiovascular status: stable Anesthetic complications: no    Last Vitals:  Filed Vitals:   10/11/15 1544 10/11/15 1550  BP: 85/59 91/55  Pulse: 49 54  Temp:    Resp: 16 19    Last Pain: There were no vitals filed for this visit.               Ieisha Gao K

## 2015-10-13 ENCOUNTER — Encounter: Payer: Self-pay | Admitting: Unknown Physician Specialty

## 2015-10-15 LAB — SURGICAL PATHOLOGY

## 2015-10-24 ENCOUNTER — Other Ambulatory Visit: Payer: Self-pay | Admitting: Internal Medicine

## 2015-10-24 DIAGNOSIS — N429 Disorder of prostate, unspecified: Secondary | ICD-10-CM | POA: Diagnosis not present

## 2015-10-24 DIAGNOSIS — R1032 Left lower quadrant pain: Secondary | ICD-10-CM

## 2015-10-24 DIAGNOSIS — R1084 Generalized abdominal pain: Secondary | ICD-10-CM | POA: Diagnosis not present

## 2015-10-24 DIAGNOSIS — L570 Actinic keratosis: Secondary | ICD-10-CM | POA: Diagnosis not present

## 2015-10-26 ENCOUNTER — Ambulatory Visit: Payer: PPO

## 2015-10-30 ENCOUNTER — Ambulatory Visit
Admission: RE | Admit: 2015-10-30 | Discharge: 2015-10-30 | Disposition: A | Discharge status: Home / Self Care | Payer: PPO | Source: Ambulatory Visit | Attending: Internal Medicine | Admitting: Internal Medicine

## 2015-10-30 DIAGNOSIS — R1084 Generalized abdominal pain: Secondary | ICD-10-CM | POA: Diagnosis not present

## 2015-10-30 DIAGNOSIS — R1032 Left lower quadrant pain: Secondary | ICD-10-CM | POA: Diagnosis not present

## 2015-10-30 MED ORDER — IOPAMIDOL (ISOVUE-300) INJECTION 61%
80.0000 mL | Freq: Once | INTRAVENOUS | Status: AC | PRN
  Administered 2015-10-30: 80 mL via INTRAVENOUS

## 2015-10-31 DIAGNOSIS — I700 Atherosclerosis of aorta: Secondary | ICD-10-CM | POA: Insufficient documentation

## 2015-12-10 DIAGNOSIS — N402 Nodular prostate without lower urinary tract symptoms: Secondary | ICD-10-CM | POA: Diagnosis not present

## 2015-12-10 DIAGNOSIS — N400 Enlarged prostate without lower urinary tract symptoms: Secondary | ICD-10-CM | POA: Diagnosis not present

## 2016-02-01 DIAGNOSIS — G44229 Chronic tension-type headache, not intractable: Secondary | ICD-10-CM | POA: Diagnosis not present

## 2016-02-01 DIAGNOSIS — K2900 Acute gastritis without bleeding: Secondary | ICD-10-CM | POA: Diagnosis not present

## 2016-06-16 DIAGNOSIS — N401 Enlarged prostate with lower urinary tract symptoms: Secondary | ICD-10-CM | POA: Diagnosis not present

## 2016-06-23 DIAGNOSIS — N400 Enlarged prostate without lower urinary tract symptoms: Secondary | ICD-10-CM | POA: Diagnosis not present

## 2016-06-23 DIAGNOSIS — N402 Nodular prostate without lower urinary tract symptoms: Secondary | ICD-10-CM | POA: Diagnosis not present

## 2016-07-17 DIAGNOSIS — H2513 Age-related nuclear cataract, bilateral: Secondary | ICD-10-CM | POA: Diagnosis not present

## 2016-09-17 ENCOUNTER — Encounter: Payer: Self-pay | Admitting: General Surgery

## 2016-09-17 DIAGNOSIS — Z79899 Other long term (current) drug therapy: Secondary | ICD-10-CM | POA: Diagnosis not present

## 2016-09-17 DIAGNOSIS — L570 Actinic keratosis: Secondary | ICD-10-CM | POA: Diagnosis not present

## 2016-09-17 DIAGNOSIS — Z125 Encounter for screening for malignant neoplasm of prostate: Secondary | ICD-10-CM | POA: Diagnosis not present

## 2016-09-17 DIAGNOSIS — Z0000 Encounter for general adult medical examination without abnormal findings: Secondary | ICD-10-CM | POA: Diagnosis not present

## 2016-09-17 DIAGNOSIS — I700 Atherosclerosis of aorta: Secondary | ICD-10-CM | POA: Diagnosis not present

## 2016-09-30 ENCOUNTER — Ambulatory Visit (INDEPENDENT_AMBULATORY_CARE_PROVIDER_SITE_OTHER): Payer: PPO | Admitting: General Surgery

## 2016-09-30 ENCOUNTER — Encounter: Payer: Self-pay | Admitting: General Surgery

## 2016-09-30 VITALS — BP 132/68 | HR 72 | Resp 14 | Ht 68.0 in | Wt 174.0 lb

## 2016-09-30 DIAGNOSIS — D2111 Benign neoplasm of connective and other soft tissue of right upper limb, including shoulder: Secondary | ICD-10-CM | POA: Diagnosis not present

## 2016-09-30 DIAGNOSIS — D171 Benign lipomatous neoplasm of skin and subcutaneous tissue of trunk: Secondary | ICD-10-CM | POA: Diagnosis not present

## 2016-09-30 DIAGNOSIS — D1721 Benign lipomatous neoplasm of skin and subcutaneous tissue of right arm: Secondary | ICD-10-CM

## 2016-09-30 MED ORDER — HYDROCODONE-ACETAMINOPHEN 5-325 MG PO TABS: 1.0000 | ORAL_TABLET | ORAL | 0 refills | Status: DC | PRN

## 2016-09-30 NOTE — Progress Notes (Signed)
Patient ID: JOH Rivas, male   DOB: 26-Feb-1941, 76 y.o.   MRN: [id number]  Chief Complaint  Patient presents with  . Lipoma    HPI Richard Rivas is a 76 y.o. male here today for a evaluation of a lipoma on his upper back. He noticed this a year ago. In the last six months he states the area has got bigger. No pain but some discomfort  when he sleeps.  Wife, Richard Rivas is present. Married 19 years. HPI  Past Medical History:  Diagnosis Date  . BPPV (benign paroxysmal positional vertigo)   . Colon polyps   . Kidney stones     Past Surgical History:  Procedure Laterality Date  . CERVICAL DISC SURGERY    . COLONOSCOPY WITH PROPOFOL N/A 10/11/2015   Procedure: COLONOSCOPY WITH PROPOFOL;  Surgeon: Manya Silvas, MD;  Location: [facility] ENDOSCOPY;  Service: Endoscopy;  Laterality: N/A;  . KIDNEY STONE SURGERY    . LAPAROSCOPIC RIGHT COLECTOMY Right 11/14/2014   Procedure: LAPAROSCOPIC RIGHT COLECTOMY;  Surgeon: Leonie Green, MD;  Location: [facility] ORS;  Service: General;  Laterality: Right;  . SHOULDER ARTHROSCOPY W/ ROTATOR CUFF REPAIR Right     History reviewed. No pertinent family history.  Social History Social History  Substance Use Topics  . Smoking status: Former Smoker    Quit date: 11/06/1994  . Smokeless tobacco: Former Systems developer    Quit date: 11/05/2012  . Alcohol use Yes     Comment: beer occasionally    Allergies  Allergen Reactions  . Bee Venom Swelling    Current Outpatient Prescriptions  Medication Sig Dispense Refill  . aspirin EC 81 MG tablet Take by mouth daily. 2 tabs daily    . FLUoxetine (PROZAC) 20 MG tablet     . lisinopril (PRINIVIL,ZESTRIL) 10 MG tablet     . HYDROcodone-acetaminophen (NORCO) 5-325 MG tablet Take 1-2 tablets by mouth every 4 (four) hours as needed for moderate pain. 20 tablet 0   No current facility-administered medications for this visit.     Review of Systems Review of Systems  Constitutional: Negative.   Respiratory:  Negative.   Cardiovascular: Negative.     Blood pressure 132/68, pulse 72, resp. rate 14, height 5\' 8"  (1.727 m), weight 174 lb (78.9 kg).  Physical Exam Physical Exam  Constitutional: He is oriented to person, place, and time. He appears well-developed and well-nourished.  Cardiovascular: Normal rate, regular rhythm and normal heart sounds.   Pulmonary/Chest: Effort normal and breath sounds normal.      Neurological: He is alert and oriented to person, place, and time.  Skin: Skin is warm and dry.       Assessment    Symptomatic lipoma right posterior shoulder.    Plan    Opportunity for excision was reviewed. The patient was amenable to proceed. The skin was cleansed with ChloraPrep and 20 mL of 0.5% Xylocaine with 0.25% Marcaine with 1-200,000 epinephrine was utilized well tolerated. The area was re-prepped with ChloraPrep and draped. A transverse incision over the mass was made and carried at the skin and subcutaneous tissue. A multilobulated mass consistent with a lipoma was identified. This extended down to but did not file 8 the deep fascia. A single feeding vessel at this level was controlled with a 3-0 Vicryl figure-of-eight suture. The deep adipose layer was approximated with a running 3-0 Vicryl suture with to tethering sutures to the deep fascia to obliterate dead space. The skin was closed with  a running 3-0 Vicryl septic suture. Benzoin, Steri-Strips followed by Telfa and Tegaderm dressing applied.  The patient tolerated the excision well. Wound care instructions reviewed. Tegaderm dressing to be removed in 3 days. Office follow-up if needed. He'll be contacted when pathology is available.     HPI, Physical Exam, Assessment and Plan have been scribed under the direction and in the presence of Hervey Ard, MD.  Gaspar Cola, CMA  I have completed the exam and reviewed the above documentation for accuracy and completeness.  I agree with the above.  Development worker, community has been used and any errors in dictation or transcription are unintentional.  Hervey Ard, M.D., F.A.C.S.  Richard Rivas 09/30/2016, 5:24 PM

## 2016-10-03 ENCOUNTER — Telehealth: Payer: Self-pay

## 2016-10-03 NOTE — Telephone Encounter (Signed)
Notified patient as instructed, patient pleased. Discussed follow-up appointments, patient agrees  

## 2016-10-03 NOTE — Telephone Encounter (Signed)
-----   Message from [name], MD sent at 10/02/2016  8:10 PM EDT ----- Please notify the patient pathology showed a benign fatty tumor. See if he looked at the [employer]. Thanks.  ----- Message ----- From: Interface, Lab In [facility] Seven Sent: 10/02/2016   4:53 PM To: [name], MD

## 2016-12-15 DIAGNOSIS — N402 Nodular prostate without lower urinary tract symptoms: Secondary | ICD-10-CM | POA: Diagnosis not present

## 2016-12-18 DIAGNOSIS — E538 Deficiency of other specified B group vitamins: Secondary | ICD-10-CM | POA: Diagnosis not present

## 2016-12-18 DIAGNOSIS — Z79899 Other long term (current) drug therapy: Secondary | ICD-10-CM | POA: Diagnosis not present

## 2016-12-22 ENCOUNTER — Other Ambulatory Visit: Payer: Self-pay | Admitting: Urology

## 2016-12-22 DIAGNOSIS — N402 Nodular prostate without lower urinary tract symptoms: Secondary | ICD-10-CM | POA: Diagnosis not present

## 2016-12-22 DIAGNOSIS — N400 Enlarged prostate without lower urinary tract symptoms: Secondary | ICD-10-CM | POA: Diagnosis not present

## 2017-01-12 ENCOUNTER — Ambulatory Visit (HOSPITAL_COMMUNITY)
Admission: RE | Admit: 2017-01-12 | Discharge: 2017-01-12 | Disposition: A | Discharge status: Home / Self Care | Payer: PPO | Source: Ambulatory Visit | Attending: Urology | Admitting: Urology

## 2017-01-12 DIAGNOSIS — N402 Nodular prostate without lower urinary tract symptoms: Secondary | ICD-10-CM | POA: Insufficient documentation

## 2017-01-12 LAB — POCT I-STAT CREATININE: CREATININE: 0.8 mg/dL (ref 0.61–1.24)

## 2017-01-12 MED ORDER — GADOBENATE DIMEGLUMINE 529 MG/ML IV SOLN
20.0000 mL | Freq: Once | INTRAVENOUS | Status: AC | PRN
  Administered 2017-01-12: 16 mL via INTRAVENOUS

## 2017-01-31 DIAGNOSIS — I219 Acute myocardial infarction, unspecified: Secondary | ICD-10-CM

## 2017-01-31 HISTORY — DX: Acute myocardial infarction, unspecified: I21.9

## 2017-02-11 ENCOUNTER — Inpatient Hospital Stay
Admission: EM | Admit: 2017-02-11 | Discharge: 2017-02-12 | DRG: 282 | Disposition: A | Discharge status: Other Acute Inpatient Hospital | Payer: PPO | Attending: Specialist | Admitting: Specialist

## 2017-02-11 ENCOUNTER — Emergency Department: Payer: PPO

## 2017-02-11 ENCOUNTER — Encounter: Payer: Self-pay | Admitting: Emergency Medicine

## 2017-02-11 DIAGNOSIS — I25110 Atherosclerotic heart disease of native coronary artery with unstable angina pectoris: Secondary | ICD-10-CM | POA: Diagnosis present

## 2017-02-11 DIAGNOSIS — F329 Major depressive disorder, single episode, unspecified: Secondary | ICD-10-CM | POA: Diagnosis not present

## 2017-02-11 DIAGNOSIS — I214 Non-ST elevation (NSTEMI) myocardial infarction: Secondary | ICD-10-CM | POA: Diagnosis not present

## 2017-02-11 DIAGNOSIS — Z86010 Personal history of colonic polyps: Secondary | ICD-10-CM | POA: Diagnosis not present

## 2017-02-11 DIAGNOSIS — Z87891 Personal history of nicotine dependence: Secondary | ICD-10-CM | POA: Diagnosis not present

## 2017-02-11 DIAGNOSIS — Z7982 Long term (current) use of aspirin: Secondary | ICD-10-CM

## 2017-02-11 DIAGNOSIS — Z79899 Other long term (current) drug therapy: Secondary | ICD-10-CM | POA: Diagnosis not present

## 2017-02-11 DIAGNOSIS — Z87442 Personal history of urinary calculi: Secondary | ICD-10-CM

## 2017-02-11 DIAGNOSIS — H40003 Preglaucoma, unspecified, bilateral: Secondary | ICD-10-CM | POA: Diagnosis not present

## 2017-02-11 DIAGNOSIS — K219 Gastro-esophageal reflux disease without esophagitis: Secondary | ICD-10-CM | POA: Diagnosis present

## 2017-02-11 DIAGNOSIS — R079 Chest pain, unspecified: Secondary | ICD-10-CM

## 2017-02-11 DIAGNOSIS — I10 Essential (primary) hypertension: Secondary | ICD-10-CM | POA: Diagnosis present

## 2017-02-11 DIAGNOSIS — R0789 Other chest pain: Secondary | ICD-10-CM | POA: Diagnosis not present

## 2017-02-11 LAB — CBC
HCT: 43.3 % (ref 40.0–52.0)
Hemoglobin: 14.4 g/dL (ref 13.0–18.0)
MCH: 29.9 pg (ref 26.0–34.0)
MCHC: 33.4 g/dL (ref 32.0–36.0)
MCV: 89.6 fL (ref 80.0–100.0)
PLATELETS: 201 10*3/uL (ref 150–440)
RBC: 4.83 MIL/uL (ref 4.40–5.90)
RDW: 13.5 % (ref 11.5–14.5)
WBC: 9.8 10*3/uL (ref 3.8–10.6)

## 2017-02-11 LAB — TYPE AND SCREEN
ABO/RH(D): A POS
Antibody Screen: NEGATIVE

## 2017-02-11 LAB — PROTIME-INR
INR: 1.03
PROTHROMBIN TIME: 13.4 s (ref 11.4–15.2)

## 2017-02-11 LAB — APTT: APTT: 25 s (ref 24–36)

## 2017-02-11 LAB — BASIC METABOLIC PANEL
Anion gap: 7 (ref 5–15)
BUN: 20 mg/dL (ref 6–20)
CALCIUM: 9.3 mg/dL (ref 8.9–10.3)
CHLORIDE: 108 mmol/L (ref 101–111)
CO2: 25 mmol/L (ref 22–32)
CREATININE: 1.24 mg/dL (ref 0.61–1.24)
GFR calc non Af Amer: 56 mL/min — ABNORMAL LOW (ref >60)
Glucose, Bld: 169 mg/dL — ABNORMAL HIGH (ref 65–99)
Potassium: 4.2 mmol/L (ref 3.5–5.1)
SODIUM: 140 mmol/L (ref 135–145)

## 2017-02-11 LAB — TROPONIN I
TROPONIN I: 0.76 ng/mL — AB (ref <0.03)
Troponin I: 0.87 ng/mL (ref <0.03)

## 2017-02-11 MED ORDER — ASPIRIN 81 MG PO CHEW
324.0000 mg | CHEWABLE_TABLET | Freq: Once | ORAL | Status: AC
  Administered 2017-02-11: 324 mg via ORAL
  Filled 2017-02-11: qty 4

## 2017-02-11 NOTE — ED Notes (Signed)
Patient is still chest pain free.

## 2017-02-11 NOTE — H&P (Signed)
History and Physical   [facility] - [place] @ [facility] Admission History and Physical [employer], D.O.    Patient Name: Richard Rivas MR#: [id number] Date of Birth: August 13, 1940 Date of Admission: 02/11/2017  Referring MD/NP/PA: Dr. Burlene Arnt Primary Care Physician: Rusty Aus, MD  Chief Complaint:  Chief Complaint  Patient presents with  . Chest Pain    HPI: Richard Rivas is a 76 y.o. 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  Patient was in a usual state of health until 2 days ago when he describes the onset of intermittent substernal pressure associated with nausea, diaphoresis.Marland Kitchenpain is localized and not associated with any shortness of breath, palpitations, dizziness. Patient was making dinner when pain started.  He has had four episodes over the past few days.    Incidentally patient does have chronic GI bleeding following a colonic resection.  He is physically active.   Patient denies fevers/chills, weakness, dizziness,  shortness of breath, vomiting, constipation, diarrhea, abdominal pain, dysuria/frequency, changes in mental status.    Otherwise there has been no change in status. Patient has been taking medication as prescribed and there has been no recent change in medication or diet.  No recent antibiotics.  There has been no recent illness, hospitalizations, travel or sick contacts.    EMS/ED Course: Patient received aspirin. Medical admission has been requested for further management of non-ST elevation MI.  Review of Systems:  CONSTITUTIONAL: No fever/chills, fatigue, weakness, weight gain/loss, headache. EYES: No blurry or double vision. ENT: No tinnitus, postnasal drip, redness or soreness of the oropharynx. RESPIRATORY: No cough, dyspnea, wheeze.  No hemoptysis.  CARDIOVASCULAR: Positive chest pain,  negativepalpitations, syncope, orthopnea. No lower extremity edema.  GASTROINTESTINAL:  positivenausea nd intermittent rectal bleeding as per history of present illness.,negative vomiting, abdominal pain, diarrhea, constipation.  No hematemesis, melena or hematochezia. GENITOURINARY: No dysuria, frequency, hematuria. ENDOCRINE: No polyuria or nocturia. No heat or cold intolerance. HEMATOLOGY: No anemia, bruising, bleeding. INTEGUMENTARY: No rashes, ulcers, lesions. MUSCULOSKELETAL: No arthritis, gout, dyspnea. NEUROLOGIC: No numbness, tingling, ataxia, seizure-type activity, weakness. PSYCHIATRIC: No anxiety, depression, insomnia.   Past Medical History:  Diagnosis Date  . BPPV (benign paroxysmal positional vertigo)   . Colon polyps   . Kidney stones     Past Surgical History:  Procedure Laterality Date  . CERVICAL DISC SURGERY    . COLON SURGERY    . COLONOSCOPY WITH PROPOFOL N/A 10/11/2015   Procedure: COLONOSCOPY WITH PROPOFOL;  Surgeon: Manya Silvas, MD;  Location: [facility] ENDOSCOPY;  Service: Endoscopy;  Laterality: N/A;  . KIDNEY STONE SURGERY    . LAPAROSCOPIC RIGHT COLECTOMY Right 11/14/2014   Procedure: LAPAROSCOPIC RIGHT COLECTOMY;  Surgeon: Leonie Green, MD;  Location: [facility] ORS;  Service: General;  Laterality: Right;  . SHOULDER ARTHROSCOPY W/ ROTATOR CUFF REPAIR Right      reports that he quit smoking about 22 years ago. He quit smokeless tobacco use about 4 years ago. He reports that he drinks alcohol. He reports that he does not use drugs.  Allergies  Allergen Reactions  . Bee Venom Swelling    Family History   Medical History Relation Name Comments  Myocardial Infarction (Heart attack) Brother    High blood pressure (Hypertension) Father    Myocardial Infarction (Heart attack) Father    Pneumonia Mother    Prostate cancer Paternal Grandfather    Ovarian  cancer Sister    Ovarian cancer Sister    Ovarian cancer Sister    Myocardial  Infarction (Heart attack) Son       Prior to Admission medications   Medication Sig Start Date End Date Taking? Authorizing Provider  aspirin EC 81 MG tablet Take 81 mg by mouth daily.    Yes [provider]  FLUoxetine (PROZAC) 20 MG capsule Take 20 mg by mouth daily.   Yes [provider]  lisinopril (PRINIVIL,ZESTRIL) 10 MG tablet Take 10 mg by mouth daily.    Yes [provider]  omeprazole (PRILOSEC) 20 MG capsule Take 20 mg by mouth daily.   Yes [provider]  vitamin B-12 (CYANOCOBALAMIN) 1000 MCG tablet Take 1,000 mcg by mouth daily.   Yes [provider]  HYDROcodone-acetaminophen (NORCO) 5-325 MG tablet Take 1-2 tablets by mouth every 4 (four) hours as needed for moderate pain. Patient not taking: Reported on 02/11/2017 09/30/16   [name], MD    Physical Exam: Vitals:   02/11/17 2010 02/11/17 2012  BP:  (!) 109/54  Pulse:  63  Resp:  18  Temp:  98.6 F (37 C)  TempSrc:  Oral  SpO2:  96%  Weight: 77.1 kg (170 lb)   Height: 5\' 8"  (1.727 m)     GENERAL: 76 y.o.-year-old male patient, well-developed, well-nourished lying in the bed in no acute distress.  Pleasant and cooperative.   HEENT: Head atraumatic, normocephalic. Pupils equal. Mucus membranes moist. NECK: Supple, full range of motion. No JVD, no bruit heard. No thyroid enlargement, no tenderness, no cervical lymphadenopathy. CHEST: Normal breath sounds bilaterally. No wheezing, rales, rhonchi or crackles. No use of accessory muscles of respiration.  No reproducible chest wall tenderness.  CARDIOVASCULAR: S1, S2 normal. No murmurs, rubs, or gallops. Cap refill <2 seconds. Pulses intact distally.  ABDOMEN: Soft, nondistended, nontender. No rebound, guarding, rigidity. Normoactive bowel sounds present in all four quadrants.  EXTREMITIES: No pedal edema, cyanosis, or clubbing. No calf tenderness or Homan's sign.  NEUROLOGIC: The patient is alert and oriented x 3.  Cranial nerves II through XII are grossly intact with no focal sensorimotor deficit. PSYCHIATRIC:  Normal affect, mood, thought content. SKIN: Warm, dry, and intact without obvious rash, lesion, or ulcer.    Labs on Admission:  CBC:  Recent Labs Lab 02/11/17 2015  WBC 9.8  HGB 14.4  HCT 43.3  MCV 89.6  PLT 196   Basic Metabolic Panel:  Recent Labs Lab 02/11/17 2015  NA 140  K 4.2  CL 108  CO2 25  GLUCOSE 169*  BUN 20  CREATININE 1.24  CALCIUM 9.3   GFR: Estimated Creatinine Clearance: 51.3 mL/min (by C-G formula based on SCr of 1.24 mg/dL). Liver Function Tests: No results for input(s): AST, ALT, ALKPHOS, BILITOT, PROT, ALBUMIN in the last 168 hours. No results for input(s): LIPASE, AMYLASE in the last 168 hours. No results for input(s): AMMONIA in the last 168 hours. Coagulation Profile:  Recent Labs Lab 02/11/17 2121  INR 1.03   Cardiac Enzymes:  Recent Labs Lab 02/11/17 2015  TROPONINI 0.76*   BNP (last 3 results) No results for input(s): PROBNP in the last 8760 hours. HbA1C: No results for input(s): HGBA1C in the last 72 hours. CBG: No results for input(s): GLUCAP in the last 168 hours. Lipid Profile: No results for input(s): CHOL, HDL, LDLCALC, TRIG, CHOLHDL, LDLDIRECT in the last 72 hours. Thyroid Function Tests: No results for input(s): TSH, T4TOTAL, FREET4, T3FREE,  THYROIDAB in the last 72 hours. Anemia Panel: No results for input(s): VITAMINB12, FOLATE, FERRITIN, TIBC, IRON, RETICCTPCT in the last 72 hours. Urine analysis: No results found for: COLORURINE, APPEARANCEUR, LABSPEC, PHURINE, GLUCOSEU, HGBUR, BILIRUBINUR, KETONESUR, PROTEINUR, UROBILINOGEN, NITRITE, LEUKOCYTESUR Sepsis Labs: @LABRCNTIP (procalcitonin:4,lacticidven:4) )No results found for this or any previous visit (from the past 240 hour(s)).   Radiological Exams on Admission: Dg Chest 2 View  Result Date: 02/11/2017 CLINICAL DATA:  Intermittent central chest pain starting  yesterday. Diaphoresis. EXAM: CHEST  2 VIEW COMPARISON:  None. FINDINGS: Cardiomediastinal silhouette is normal. Mildly increased lung volumes without pleural effusion or focal consolidation. No pneumothorax. Soft tissue planes and included osseous structures are nonsuspicious. Mild degenerative change of the thoracic spine. IMPRESSION: Hyperinflation without focal consolidation. Electronically Signed   By: Elon Alas M.D.   On: 02/11/2017 20:32    EKG: Normal sinus rhythm at 65 bpm with normal axis and nonspecific ST-T wave changes.   Assessment/Plan  This is a 76 y.o. male with a history of vertigo,GERD, depression, hypertension nephrolithiasis and colonic polyps status post colon resection, chronic GI bleeding now being admitted with:  #. Non-ST elevation MI - Admit to inpatient with telemetry monitoring. - Trend troponins, check lipids and TSH. - Morphine, nitro, aspirin. Add beta blocker and statin. - Check echo - Cardiology consultation has been requested. - Heparin was withheld in ER due to chronic GI bleeding. I believe benefit of anticoagulating outweighs risk in this case.  Will give ONE WEIGHT BASED DOSE of Lovenox and monitor CBC closely.  Monitor for bleeding.    #. History of hypertension Continue lisinopril  #. History of GERD Continue  Prilosec  #. History of depression - Continue Prozac  Admission status: inpatient IV Fluids: HL Diet/Nutrition: nothing by mouth after midnight Consults called: cardiology  DVT Px: Lovenox, SCDs and early ambulation. Code Status: Full Code  Disposition Plan: To home in 1-2 days  All the records are reviewed and case discussed with ED provider. Management plans discussed with the patient and/or family who express understanding and agree with plan of care.  Clarissia Mckeen D.O. on 02/11/2017 at 11:08 PM Between 7am to 6pm - Pager - [phone] After 6pm go to [email or web address] - [employer] [facility] [place]  [facility] Office [phone] CC: Primary care physician; Rusty Aus, MD   02/11/2017, 11:08 PM

## 2017-02-11 NOTE — ED Triage Notes (Signed)
Patient with complaint of intermittent central chest pain that started on Monday night. Patient states that he becomes diaphoretic. Patient denies nausea or shortness of breath.

## 2017-02-11 NOTE — ED Provider Notes (Addendum)
[facility] Emergency Department Provider Note  ____________________________________________   I have reviewed the triage vital signs and the nursing notes.   HISTORY  Chief Complaint Chest Pain    HPI Richard Rivas is a 76 y.o. male who presents today complaining of chest pain. He said it off and on since Monday. Last about 15 minutes. Not necessarily associated with exertion. No radiation. Substernal. Pressure-like. Does not have any now. Did have some poor for coming in. He gets diaphoretic and nauseated with it. Has not had this before. Strong family history of CAD. Nothing makes it better, nothing makes it worse. He has had no radiation to the back, no pleuritic chest pain or shortness of breath his legs are not swelling and at this time he has no symptoms or complaints. 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 Nothing unexpected for him however    Past Medical History:  Diagnosis Date  . BPPV (benign paroxysmal positional vertigo)   . Colon polyps   . Kidney stones     Patient Active Problem List   Diagnosis Date Noted  . 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 Colonic polyp 11/14/2014    Past Surgical History:  Procedure Laterality Date  . CERVICAL DISC SURGERY    . COLON SURGERY    . COLONOSCOPY WITH PROPOFOL N/A 10/11/2015   Procedure: COLONOSCOPY WITH PROPOFOL;  Surgeon: Manya Silvas, MD;  Location: [facility] ENDOSCOPY;  Service: Endoscopy;  Laterality: N/A;  . KIDNEY STONE SURGERY    . LAPAROSCOPIC RIGHT COLECTOMY Right 11/14/2014   Procedure: LAPAROSCOPIC RIGHT COLECTOMY;  Surgeon: Leonie Green, MD;  Location: [facility] ORS;  Service: General;  Laterality: Right;  . SHOULDER ARTHROSCOPY W/ ROTATOR CUFF REPAIR Right     Prior to Admission medications   Medication Sig Start Date End Date Taking? Authorizing Provider  aspirin  EC 81 MG tablet Take 81 mg by mouth daily.    Yes [provider]  FLUoxetine (PROZAC) 20 MG capsule Take 20 mg by mouth daily.   Yes [provider]  lisinopril (PRINIVIL,ZESTRIL) 10 MG tablet Take 10 mg by mouth daily.    Yes [provider]  HYDROcodone-acetaminophen (NORCO) 5-325 MG tablet Take 1-2 tablets by mouth every 4 (four) hours as needed for moderate pain. Patient not taking: Reported on 02/11/2017 09/30/16   [name], MD    Allergies Bee venom  No family history on file.  Social History Social History  Substance Use Topics  . Smoking status: Former Smoker    Quit date: 11/06/1994  . Smokeless tobacco: Former Systems developer    Quit date: 11/05/2012  . Alcohol use Yes     Comment: beer occasionally    Review of Systems Constitutional: No fever/chills Eyes: No visual changes. ENT: No sore throat. No stiff neck no neck pain Cardiovascular: Denies chest pain. Respiratory: Denies shortness of breath. Gastrointestinal:   no vomiting.  No diarrhea.  No constipation. Genitourinary: Negative for dysuria. Musculoskeletal: Negative lower extremity swelling Skin: Negative for rash. Neurological: Negative for severe headaches, focal weakness or numbness.   ____________________________________________   PHYSICAL EXAM:  VITAL SIGNS: ED Triage Vitals  Enc Vitals Group     BP 02/11/17 2012 (!) 109/54     Pulse Rate 02/11/17 2012 63     Resp 02/11/17 2012 18     Temp 02/11/17  2012 98.6 F (37 C)     Temp Source 02/11/17 2012 Oral     SpO2 02/11/17 2012 96 %     Weight 02/11/17 2010 170 lb (77.1 kg)     Height 02/11/17 2010 5\' 8"  (1.727 m)     Head Circumference --      Peak Flow --      Pain Score 02/11/17 2009 1     Pain Loc --      Pain Edu? --      Excl. in [place]? --     Constitutional: Alert and oriented. Well appearing and in no acute distress. Eyes: Conjunctivae are normal Head: Atraumatic HEENT: No congestion/rhinnorhea. Mucous  membranes are moist.  Oropharynx non-erythematous Neck:   Nontender with no meningismus, no masses, no stridor Cardiovascular: Normal rate, regular rhythm. Grossly normal heart sounds.  Good peripheral circulation. Respiratory: Normal respiratory effort.  No retractions. Lungs CTAB. Abdominal: Soft and nontender. No distention. No guarding no rebound Back:  There is no focal tenderness or step off.  there is no midline tenderness there are no lesions noted. there is no CVA tenderness Musculoskeletal: No lower extremity tenderness, no upper extremity tenderness. No joint effusions, no DVT signs strong distal pulses no edema Neurologic:  Normal speech and language. No gross focal neurologic deficits are appreciated.  Skin:  Skin is warm, dry and intact. No rash noted. Psychiatric: Mood and affect are normal. Speech and behavior are normal.  ____________________________________________   LABS (all labs ordered are listed, but only abnormal results are displayed)  Labs Reviewed  BASIC METABOLIC PANEL - Abnormal; Notable for the following:       Result Value   Glucose, Bld 169 (*)    GFR calc non Af Amer 56 (*)    All other components within normal limits  TROPONIN I - Abnormal; Notable for the following:    Troponin I 0.76 (*)    All other components within normal limits  CBC  PROTIME-INR  APTT  TYPE AND SCREEN   ____________________________________________  EKG  I personally interpreted any EKGs ordered by me or triage sinus rhythm, rate 65 bpm no acute ST elevation or acute ST depression, normal axis unremarkable EKG ____________________________________________  RADIOLOGY  I reviewed any imaging ordered by me or triage that were performed during my shift and, if possible, patient and/or family made aware of any abnormal findings. ____________________________________________   PROCEDURES  Procedure(s) performed: None  Procedures  Critical Care performed: CRITICAL  CARE Performed by: Schuyler Amor   Total critical care time: 36 minutes  Critical care time was exclusive of separately billable procedures and treating other patients.  Critical care was necessary to treat or prevent imminent or life-threatening deterioration.  Critical care was time spent personally by me on the following activities: development of treatment plan with patient and/or surrogate as well as nursing, discussions with consultants, evaluation of patient's response to treatment, examination of patient, obtaining history from patient or surrogate, ordering and performing treatments and interventions, ordering and review of laboratory studies, ordering and review of radiographic studies, pulse oximetry and re-evaluation of patient's condition.   ____________________________________________   INITIAL IMPRESSION / ASSESSMENT AND PLAN / ED COURSE  Pertinent labs & imaging results that were available during my care of the patient were reviewed by me and considered in my medical decision making (see chart for details). patient here with chest pain and a positive troponin, pain-free at this time we will give him aspirin. I  will forego heparinizing him as he is pain-free at this time and has history of recurrent GI bleed that that may be necessary. We'll send coags and a type and screen, we will admit to the hospital.    ____________________________________________   FINAL CLINICAL IMPRESSION(S) / ED DIAGNOSES  Final diagnoses:  Chest pain, unspecified type      This chart was dictated using voice recognition software.  Despite best efforts to proofread,  errors can occur which can change meaning.      Schuyler Amor, MD 02/11/17 2124    Schuyler Amor, MD 02/11/17 2303

## 2017-02-12 ENCOUNTER — Inpatient Hospital Stay (HOSPITAL_COMMUNITY)
Admission: AD | Admit: 2017-02-12 | Discharge: 2017-02-18 | DRG: 236 | Disposition: A | Discharge status: Home / Self Care | Payer: PPO | Source: Other Acute Inpatient Hospital | Attending: Surgery | Admitting: Surgery

## 2017-02-12 ENCOUNTER — Encounter: Payer: Self-pay | Admitting: Emergency Medicine

## 2017-02-12 ENCOUNTER — Encounter
Admission: EM | Disposition: A | Discharge status: Other Acute Inpatient Hospital | Payer: Self-pay | Source: Home / Self Care | Attending: Specialist

## 2017-02-12 ENCOUNTER — Encounter (HOSPITAL_COMMUNITY): Payer: Self-pay | Admitting: *Deleted

## 2017-02-12 DIAGNOSIS — Z79891 Long term (current) use of opiate analgesic: Secondary | ICD-10-CM

## 2017-02-12 DIAGNOSIS — F329 Major depressive disorder, single episode, unspecified: Secondary | ICD-10-CM | POA: Diagnosis not present

## 2017-02-12 DIAGNOSIS — H8110 Benign paroxysmal vertigo, unspecified ear: Secondary | ICD-10-CM | POA: Diagnosis not present

## 2017-02-12 DIAGNOSIS — Z87442 Personal history of urinary calculi: Secondary | ICD-10-CM

## 2017-02-12 DIAGNOSIS — Z01810 Encounter for preprocedural cardiovascular examination: Secondary | ICD-10-CM | POA: Diagnosis not present

## 2017-02-12 DIAGNOSIS — I2510 Atherosclerotic heart disease of native coronary artery without angina pectoris: Secondary | ICD-10-CM | POA: Diagnosis not present

## 2017-02-12 DIAGNOSIS — I214 Non-ST elevation (NSTEMI) myocardial infarction: Principal | ICD-10-CM | POA: Diagnosis present

## 2017-02-12 DIAGNOSIS — I4891 Unspecified atrial fibrillation: Secondary | ICD-10-CM | POA: Diagnosis not present

## 2017-02-12 DIAGNOSIS — I209 Angina pectoris, unspecified: Secondary | ICD-10-CM | POA: Diagnosis not present

## 2017-02-12 DIAGNOSIS — J9811 Atelectasis: Secondary | ICD-10-CM | POA: Diagnosis not present

## 2017-02-12 DIAGNOSIS — D696 Thrombocytopenia, unspecified: Secondary | ICD-10-CM | POA: Diagnosis not present

## 2017-02-12 DIAGNOSIS — R001 Bradycardia, unspecified: Secondary | ICD-10-CM | POA: Diagnosis not present

## 2017-02-12 DIAGNOSIS — I252 Old myocardial infarction: Secondary | ICD-10-CM | POA: Diagnosis not present

## 2017-02-12 DIAGNOSIS — I9789 Other postprocedural complications and disorders of the circulatory system, not elsewhere classified: Secondary | ICD-10-CM | POA: Diagnosis not present

## 2017-02-12 DIAGNOSIS — D62 Acute posthemorrhagic anemia: Secondary | ICD-10-CM | POA: Diagnosis not present

## 2017-02-12 DIAGNOSIS — K219 Gastro-esophageal reflux disease without esophagitis: Secondary | ICD-10-CM | POA: Diagnosis not present

## 2017-02-12 DIAGNOSIS — Y838 Other surgical procedures as the cause of abnormal reaction of the patient, or of later complication, without mention of misadventure at the time of the procedure: Secondary | ICD-10-CM | POA: Diagnosis not present

## 2017-02-12 DIAGNOSIS — R079 Chest pain, unspecified: Secondary | ICD-10-CM | POA: Diagnosis not present

## 2017-02-12 DIAGNOSIS — I081 Rheumatic disorders of both mitral and tricuspid valves: Secondary | ICD-10-CM | POA: Diagnosis not present

## 2017-02-12 DIAGNOSIS — Z91030 Bee allergy status: Secondary | ICD-10-CM | POA: Diagnosis not present

## 2017-02-12 DIAGNOSIS — J449 Chronic obstructive pulmonary disease, unspecified: Secondary | ICD-10-CM | POA: Diagnosis present

## 2017-02-12 DIAGNOSIS — I10 Essential (primary) hypertension: Secondary | ICD-10-CM | POA: Diagnosis present

## 2017-02-12 DIAGNOSIS — T462X5A Adverse effect of other antidysrhythmic drugs, initial encounter: Secondary | ICD-10-CM | POA: Diagnosis not present

## 2017-02-12 DIAGNOSIS — I808 Phlebitis and thrombophlebitis of other sites: Secondary | ICD-10-CM | POA: Diagnosis not present

## 2017-02-12 DIAGNOSIS — Z79899 Other long term (current) drug therapy: Secondary | ICD-10-CM | POA: Diagnosis not present

## 2017-02-12 DIAGNOSIS — Z951 Presence of aortocoronary bypass graft: Secondary | ICD-10-CM | POA: Diagnosis not present

## 2017-02-12 DIAGNOSIS — J90 Pleural effusion, not elsewhere classified: Secondary | ICD-10-CM | POA: Diagnosis not present

## 2017-02-12 DIAGNOSIS — Z87891 Personal history of nicotine dependence: Secondary | ICD-10-CM

## 2017-02-12 DIAGNOSIS — Z7982 Long term (current) use of aspirin: Secondary | ICD-10-CM | POA: Diagnosis not present

## 2017-02-12 DIAGNOSIS — E8770 Fluid overload, unspecified: Secondary | ICD-10-CM | POA: Diagnosis present

## 2017-02-12 DIAGNOSIS — Z86010 Personal history of colonic polyps: Secondary | ICD-10-CM

## 2017-02-12 DIAGNOSIS — R7989 Other specified abnormal findings of blood chemistry: Secondary | ICD-10-CM | POA: Diagnosis not present

## 2017-02-12 DIAGNOSIS — I25110 Atherosclerotic heart disease of native coronary artery with unstable angina pectoris: Secondary | ICD-10-CM | POA: Diagnosis not present

## 2017-02-12 HISTORY — DX: Essential (primary) hypertension: I10

## 2017-02-12 HISTORY — DX: Gastro-esophageal reflux disease without esophagitis: K21.9

## 2017-02-12 HISTORY — DX: Acute myocardial infarction, unspecified: I21.9

## 2017-02-12 HISTORY — DX: Personal history of urinary calculi: Z87.442

## 2017-02-12 HISTORY — DX: Major depressive disorder, single episode, unspecified: F32.9

## 2017-02-12 HISTORY — PX: LEFT HEART CATH AND CORONARY ANGIOGRAPHY: CATH118249

## 2017-02-12 HISTORY — DX: Atherosclerotic heart disease of native coronary artery without angina pectoris: I25.10

## 2017-02-12 HISTORY — DX: Depression, unspecified: F32.A

## 2017-02-12 LAB — LIPID PANEL
CHOL/HDL RATIO: 3.5 ratio
Cholesterol: 150 mg/dL (ref 0–200)
HDL: 43 mg/dL (ref >40)
LDL CALC: 87 mg/dL (ref 0–99)
TRIGLYCERIDES: 102 mg/dL (ref <150)
VLDL: 20 mg/dL (ref 0–40)

## 2017-02-12 LAB — CBC
HEMATOCRIT: 40.5 % (ref 40.0–52.0)
Hemoglobin: 13.5 g/dL (ref 13.0–18.0)
MCH: 29.9 pg (ref 26.0–34.0)
MCHC: 33.4 g/dL (ref 32.0–36.0)
MCV: 89.3 fL (ref 80.0–100.0)
PLATELETS: 192 10*3/uL (ref 150–440)
RBC: 4.54 MIL/uL (ref 4.40–5.90)
RDW: 13.8 % (ref 11.5–14.5)
WBC: 7.2 10*3/uL (ref 3.8–10.6)

## 2017-02-12 LAB — BASIC METABOLIC PANEL
Anion gap: 5 (ref 5–15)
BUN: 19 mg/dL (ref 6–20)
CALCIUM: 8.5 mg/dL — AB (ref 8.9–10.3)
CHLORIDE: 108 mmol/L (ref 101–111)
CO2: 27 mmol/L (ref 22–32)
Creatinine, Ser: 1.06 mg/dL (ref 0.61–1.24)
GFR calc Af Amer: >60 mL/min (ref >60)
GFR calc non Af Amer: >60 mL/min (ref >60)
GLUCOSE: 106 mg/dL — AB (ref 65–99)
Potassium: 3.7 mmol/L (ref 3.5–5.1)
Sodium: 140 mmol/L (ref 135–145)

## 2017-02-12 LAB — PROTIME-INR
INR: 1.07
Prothrombin Time: 13.8 seconds (ref 11.4–15.2)

## 2017-02-12 LAB — CARDIAC CATHETERIZATION: CATHEFQUANT: 55 %

## 2017-02-12 LAB — T4, FREE: FREE T4: 0.91 ng/dL (ref 0.61–1.12)

## 2017-02-12 LAB — TSH: TSH: 1.179 u[IU]/mL (ref 0.350–4.500)

## 2017-02-12 LAB — TROPONIN I: Troponin I: 0.72 ng/mL (ref <0.03)

## 2017-02-12 SURGERY — LEFT HEART CATH AND CORONARY ANGIOGRAPHY
Anesthesia: Moderate Sedation

## 2017-02-12 MED ORDER — SODIUM CHLORIDE 0.9% FLUSH: 3.0000 mL | Freq: Two times a day (BID) | INTRAVENOUS | Status: DC

## 2017-02-12 MED ORDER — EPINEPHRINE PF 1 MG/ML IJ SOLN
0.0000 ug/min | INTRAVENOUS | Status: DC
  Filled 2017-02-12: qty 4

## 2017-02-12 MED ORDER — POTASSIUM CHLORIDE 2 MEQ/ML IV SOLN
80.0000 meq | INTRAVENOUS | Status: DC
  Filled 2017-02-12: qty 40

## 2017-02-12 MED ORDER — ATORVASTATIN CALCIUM 80 MG PO TABS: 80.0000 mg | ORAL_TABLET | Freq: Every day | ORAL | Status: DC

## 2017-02-12 MED ORDER — SODIUM CHLORIDE 0.9 % IV SOLN
INTRAVENOUS | Status: DC
  Filled 2017-02-12: qty 1

## 2017-02-12 MED ORDER — LIDOCAINE HCL (PF) 1 % IJ SOLN
INTRAMUSCULAR | Status: AC
  Filled 2017-02-12: qty 30

## 2017-02-12 MED ORDER — SODIUM CHLORIDE 0.9 % IV SOLN
1.5000 mg/kg/h | INTRAVENOUS | Status: DC
  Filled 2017-02-12: qty 25

## 2017-02-12 MED ORDER — SODIUM CHLORIDE 0.9% FLUSH: 3.0000 mL | INTRAVENOUS | Status: DC | PRN

## 2017-02-12 MED ORDER — LISINOPRIL 10 MG PO TABS
10.0000 mg | ORAL_TABLET | Freq: Every day | ORAL | Status: DC
  Administered 2017-02-12: 10 mg via ORAL
  Filled 2017-02-12: qty 1

## 2017-02-12 MED ORDER — HEPARIN (PORCINE) IN NACL 2-0.9 UNIT/ML-% IJ SOLN
INTRAMUSCULAR | Status: AC
  Filled 2017-02-12: qty 500

## 2017-02-12 MED ORDER — ENOXAPARIN SODIUM 80 MG/0.8ML ~~LOC~~ SOLN
80.0000 mg | Freq: Once | SUBCUTANEOUS | Status: AC
  Administered 2017-02-12: 80 mg via SUBCUTANEOUS
  Filled 2017-02-12: qty 0.8

## 2017-02-12 MED ORDER — TRANEXAMIC ACID (OHS) PUMP PRIME SOLUTION
2.0000 mg/kg | INTRAVENOUS | Status: DC
  Filled 2017-02-12: qty 1.57

## 2017-02-12 MED ORDER — IOPAMIDOL (ISOVUE-300) INJECTION 61%
INTRAVENOUS | Status: DC | PRN
  Administered 2017-02-12: 120 mL via INTRA_ARTERIAL

## 2017-02-12 MED ORDER — MIDAZOLAM HCL 2 MG/2ML IJ SOLN
INTRAMUSCULAR | Status: DC | PRN
  Administered 2017-02-12: 1 mg via INTRAVENOUS

## 2017-02-12 MED ORDER — SODIUM CHLORIDE 0.9 % IV SOLN
INTRAVENOUS | Status: DC
  Filled 2017-02-12: qty 30

## 2017-02-12 MED ORDER — MAGNESIUM SULFATE 50 % IJ SOLN
40.0000 meq | INTRAMUSCULAR | Status: DC
  Filled 2017-02-12: qty 10

## 2017-02-12 MED ORDER — HEPARIN (PORCINE) IN NACL 100-0.45 UNIT/ML-% IJ SOLN: 1000.0000 [IU]/h | INTRAMUSCULAR | Status: DC

## 2017-02-12 MED ORDER — TRANEXAMIC ACID (OHS) BOLUS VIA INFUSION
15.0000 mg/kg | INTRAVENOUS | Status: DC
  Filled 2017-02-12: qty 1175

## 2017-02-12 MED ORDER — SODIUM CHLORIDE 0.9 % IV SOLN: 250.0000 mL | INTRAVENOUS | Status: DC | PRN

## 2017-02-12 MED ORDER — HEPARIN (PORCINE) IN NACL 100-0.45 UNIT/ML-% IJ SOLN
1000.0000 [IU]/h | INTRAMUSCULAR | Status: DC
  Administered 2017-02-12: 1000 [IU]/h via INTRAVENOUS

## 2017-02-12 MED ORDER — HEPARIN (PORCINE) IN NACL 100-0.45 UNIT/ML-% IJ SOLN
10.0000 [IU]/kg/h | INTRAMUSCULAR | Status: DC
Start: 2017-02-12 — End: 2017-02-12

## 2017-02-12 MED ORDER — SODIUM CHLORIDE 0.9 % WEIGHT BASED INFUSION
1.0000 mL/kg/h | INTRAVENOUS | Status: DC
Start: 2017-02-13 — End: 2017-02-12

## 2017-02-12 MED ORDER — DEXMEDETOMIDINE HCL IN NACL 400 MCG/100ML IV SOLN
0.1000 ug/kg/h | INTRAVENOUS | Status: DC
  Filled 2017-02-12: qty 100

## 2017-02-12 MED ORDER — ASPIRIN EC 81 MG PO TBEC
81.0000 mg | DELAYED_RELEASE_TABLET | Freq: Every day | ORAL | Status: DC
  Administered 2017-02-12: 81 mg via ORAL
  Filled 2017-02-12: qty 1

## 2017-02-12 MED ORDER — ONDANSETRON HCL 4 MG/2ML IJ SOLN
4.0000 mg | Freq: Four times a day (QID) | INTRAMUSCULAR | Status: DC | PRN
Start: 2017-02-12 — End: 2017-02-12

## 2017-02-12 MED ORDER — NITROGLYCERIN IN D5W 200-5 MCG/ML-% IV SOLN
2.0000 ug/min | INTRAVENOUS | Status: DC
  Filled 2017-02-12: qty 250

## 2017-02-12 MED ORDER — METOPROLOL TARTRATE 25 MG PO TABS
12.5000 mg | ORAL_TABLET | Freq: Two times a day (BID) | ORAL | Status: DC
  Administered 2017-02-12: 12.5 mg via ORAL
  Filled 2017-02-12: qty 1

## 2017-02-12 MED ORDER — PAPAVERINE HCL 30 MG/ML IJ SOLN
INTRAMUSCULAR | Status: DC
  Filled 2017-02-12: qty 2.5

## 2017-02-12 MED ORDER — DEXTROSE 5 % IV SOLN
750.0000 mg | INTRAVENOUS | Status: DC
  Filled 2017-02-12: qty 750

## 2017-02-12 MED ORDER — ASPIRIN 81 MG PO CHEW: 81.0000 mg | CHEWABLE_TABLET | ORAL | Status: DC

## 2017-02-12 MED ORDER — ONDANSETRON HCL 4 MG/2ML IJ SOLN: 4.0000 mg | Freq: Four times a day (QID) | INTRAMUSCULAR | Status: DC | PRN

## 2017-02-12 MED ORDER — SODIUM CHLORIDE 0.9 % WEIGHT BASED INFUSION: 1.0000 mL/kg/h | INTRAVENOUS | Status: DC

## 2017-02-12 MED ORDER — SODIUM CHLORIDE 0.9 % IV SOLN
30.0000 ug/min | INTRAVENOUS | Status: DC
  Filled 2017-02-12: qty 2

## 2017-02-12 MED ORDER — ACETAMINOPHEN 325 MG PO TABS: 650.0000 mg | ORAL_TABLET | ORAL | Status: DC | PRN

## 2017-02-12 MED ORDER — HEPARIN (PORCINE) IN NACL 100-0.45 UNIT/ML-% IJ SOLN
INTRAMUSCULAR | Status: AC
  Filled 2017-02-12: qty 250

## 2017-02-12 MED ORDER — FLUOXETINE HCL 20 MG PO CAPS
20.0000 mg | ORAL_CAPSULE | Freq: Every day | ORAL | Status: DC
  Administered 2017-02-12: 20 mg via ORAL
  Filled 2017-02-12: qty 1

## 2017-02-12 MED ORDER — MIDAZOLAM HCL 2 MG/2ML IJ SOLN
INTRAMUSCULAR | Status: AC
  Filled 2017-02-12: qty 2

## 2017-02-12 MED ORDER — DEXTROSE 5 % IV SOLN
1.5000 g | INTRAVENOUS | Status: DC
  Filled 2017-02-12: qty 1.5

## 2017-02-12 MED ORDER — IPRATROPIUM-ALBUTEROL 0.5-2.5 (3) MG/3ML IN SOLN: 3.0000 mL | Freq: Four times a day (QID) | RESPIRATORY_TRACT | Status: DC | PRN

## 2017-02-12 MED ORDER — ATORVASTATIN CALCIUM 20 MG PO TABS: 80.0000 mg | ORAL_TABLET | Freq: Every day | ORAL | Status: DC

## 2017-02-12 MED ORDER — NITROGLYCERIN 0.4 MG SL SUBL: 0.4000 mg | SUBLINGUAL_TABLET | SUBLINGUAL | Status: DC | PRN

## 2017-02-12 MED ORDER — SODIUM CHLORIDE 0.9 % WEIGHT BASED INFUSION
3.0000 mL/kg/h | INTRAVENOUS | Status: DC
  Administered 2017-02-12: 3 mL/kg/h via INTRAVENOUS

## 2017-02-12 MED ORDER — VANCOMYCIN HCL 10 G IV SOLR
1250.0000 mg | INTRAVENOUS | Status: DC
  Filled 2017-02-12: qty 1250

## 2017-02-12 MED ORDER — MORPHINE SULFATE (PF) 2 MG/ML IV SOLN: 1.0000 mg | INTRAVENOUS | Status: DC | PRN

## 2017-02-12 MED ORDER — DOPAMINE-DEXTROSE 3.2-5 MG/ML-% IV SOLN
0.0000 ug/kg/min | INTRAVENOUS | Status: DC
  Filled 2017-02-12: qty 250

## 2017-02-12 MED ORDER — FENTANYL CITRATE (PF) 100 MCG/2ML IJ SOLN
INTRAMUSCULAR | Status: DC | PRN
Start: 2017-02-12 — End: 2017-02-12
  Administered 2017-02-12: 25 ug via INTRAVENOUS

## 2017-02-12 MED ORDER — PANTOPRAZOLE SODIUM 40 MG PO TBEC
40.0000 mg | DELAYED_RELEASE_TABLET | Freq: Every day | ORAL | Status: DC
  Administered 2017-02-12: 40 mg via ORAL
  Filled 2017-02-12: qty 1

## 2017-02-12 MED ORDER — VITAMIN B-12 1000 MCG PO TABS
1000.0000 ug | ORAL_TABLET | Freq: Every day | ORAL | Status: DC
  Administered 2017-02-12: 1000 ug via ORAL
  Filled 2017-02-12: qty 1

## 2017-02-12 MED ORDER — FENTANYL CITRATE (PF) 100 MCG/2ML IJ SOLN
INTRAMUSCULAR | Status: AC
Start: 2017-02-12 — End: 2017-02-12
  Filled 2017-02-12: qty 2

## 2017-02-12 MED ORDER — METOPROLOL TARTRATE 25 MG PO TABS: 12.5000 mg | ORAL_TABLET | Freq: Two times a day (BID) | ORAL | Status: DC

## 2017-02-12 MED ORDER — INFLUENZA VAC SPLIT HIGH-DOSE 0.5 ML IM SUSY
0.5000 mL | PREFILLED_SYRINGE | INTRAMUSCULAR | Status: DC
  Filled 2017-02-12: qty 0.5

## 2017-02-12 MED ORDER — ASPIRIN 81 MG PO CHEW: 81.0000 mg | CHEWABLE_TABLET | Freq: Every day | ORAL | Status: DC

## 2017-02-12 MED ORDER — ACETAMINOPHEN 325 MG PO TABS
650.0000 mg | ORAL_TABLET | Freq: Four times a day (QID) | ORAL | Status: DC | PRN
  Administered 2017-02-12: 650 mg via ORAL
  Filled 2017-02-12: qty 2

## 2017-02-12 SURGICAL SUPPLY — 10 items
CATH 5FR PIGTAIL DIAGNOSTIC (CATHETERS) ×3 IMPLANT
CATH INFINITI 5FR JL4 (CATHETERS) ×3 IMPLANT
CATH INFINITI JR4 5F (CATHETERS) ×3 IMPLANT
DEVICE CLOSURE MYNXGRIP 5F (Vascular Products) ×3 IMPLANT
KIT MANI 3VAL PERCEP (MISCELLANEOUS) ×3 IMPLANT
NEEDLE PERC 18GX7CM (NEEDLE) ×3 IMPLANT
PACK CARDIAC CATH (CUSTOM PROCEDURE TRAY) ×3 IMPLANT
SHEATH AVANTI 5FR X 11CM (SHEATH) ×3 IMPLANT
WIRE EMERALD 3MM-J .035X150CM (WIRE) ×3 IMPLANT
WIRE HITORQ VERSACORE ST 145CM (WIRE) ×3 IMPLANT

## 2017-02-12 NOTE — Progress Notes (Signed)
ANTICOAGULATION CONSULT NOTE - Initial Consult  Pharmacy Consult for lovenox Indication: chest pain/ACS  Allergies  Allergen Reactions  . Bee Venom Swelling    Patient Measurements: Height: 5\' 8"  (172.7 cm) Weight: 174 lb 9.6 oz (79.2 kg) IBW/kg (Calculated) : 68.4 Heparin Dosing Weight: 79.2 kg  Vital Signs: Temp: 97.7 F (36.5 C) (09/13 0013) Temp Source: Oral (09/13 0013) BP: 134/62 (09/13 0013) Pulse Rate: 57 (09/13 0013)  Labs:  Recent Labs  02/11/17 2015 02/11/17 2121 02/11/17 2309  HGB 14.4  --   --   HCT 43.3  --   --   PLT 201  --   --   APTT  --  25  --   LABPROT  --  13.4  --   INR  --  1.03  --   CREATININE 1.24  --   --   TROPONINI 0.76*  --  0.87*    Estimated Creatinine Clearance: 51.3 mL/min (by C-G formula based on SCr of 1.24 mg/dL).   Medical History: Past Medical History:  Diagnosis Date  . BPPV (benign paroxysmal positional vertigo)   . Colon polyps   . Kidney stones     Medications:  Scheduled:  . aspirin EC  81 mg Oral Daily  . atorvastatin  80 mg Oral q1800  . FLUoxetine  20 mg Oral Daily  . lisinopril  10 mg Oral Daily  . metoprolol tartrate  12.5 mg Oral BID  . pantoprazole  40 mg Oral Daily  . sodium chloride flush  3 mL Intravenous Q12H  . vitamin B-12  1,000 mcg Oral Daily    Assessment: Patient admitted for CP w/ trops up to 0.87 >> 0.76. Patient has a h/o bowel resection and has occasional BRBPR as a result patient will be given one dose of full-dose lovenox.  Goal of Therapy:  Monitor platelets by anticoagulation protocol: Yes   Plan:  Lovenox 1 mg/kg x 1 subq ~ 80 mg subq x 1 dose.  Tobie Lords, PharmD, BCPS Clinical Pharmacist 02/12/2017

## 2017-02-12 NOTE — H&P (Deleted)
  The note originally documented on this encounter has been moved the the encounter in which it belongs.  

## 2017-02-12 NOTE — Consult Note (Signed)
Reason for Consult: Non-STEMI unstable angina Referring Physician: Dr. Burlene Arnt, Dr Emily Filbert primary  Richard Rivas is an 76 y.o. male.  HPI: Patient's a 76 year old with unstable angina non-STEMI with elevated troponins. Patient started having unstable angina symptoms for the last few days was scheduled to see his primary physician but then came to emergency room when the chest pain got worse. Patient had diaphoresis shortness of breath weakness and fatigue. Chest pain was midsternal without radiation he started having patient denies any previous cardiac history.  Past Medical History:  Diagnosis Date  . BPPV (benign paroxysmal positional vertigo)   . Colon polyps   . Kidney stones     Past Surgical History:  Procedure Laterality Date  . CERVICAL DISC SURGERY    . COLON SURGERY    . COLONOSCOPY WITH PROPOFOL N/A 10/11/2015   Procedure: COLONOSCOPY WITH PROPOFOL;  Surgeon: Manya Silvas, MD;  Location: [facility] ENDOSCOPY;  Service: Endoscopy;  Laterality: N/A;  . KIDNEY STONE SURGERY    . LAPAROSCOPIC RIGHT COLECTOMY Right 11/14/2014   Procedure: LAPAROSCOPIC RIGHT COLECTOMY;  Surgeon: Leonie Green, MD;  Location: [facility] ORS;  Service: General;  Laterality: Right;  . SHOULDER ARTHROSCOPY W/ ROTATOR CUFF REPAIR Right     History reviewed. No pertinent family history.  Social History:  reports that he quit smoking about 22 years ago. He quit smokeless tobacco use about 4 years ago. He reports that he drinks alcohol. He reports that he does not use drugs.  Allergies:  Allergies  Allergen Reactions  . Bee Venom Swelling    Medications: I have reviewed the patient's current medications.  Results for orders placed or performed during the hospital encounter of 02/11/17 (from the past 48 hour(s))  Basic metabolic panel     Status: Abnormal   Collection Time: 02/11/17  8:15 PM  Result Value Ref Range   Sodium 140 135 - 145 mmol/L   Potassium 4.2 3.5 - 5.1 mmol/L   Chloride 108  101 - 111 mmol/L   CO2 25 22 - 32 mmol/L   Glucose, Bld 169 (H) 65 - 99 mg/dL   BUN 20 6 - 20 mg/dL   Creatinine, Ser 1.24 0.61 - 1.24 mg/dL   Calcium 9.3 8.9 - 10.3 mg/dL   GFR calc non Af Amer 56 (L) >60 mL/min   GFR calc Af Amer >60 >60 mL/min    Comment: (NOTE) The eGFR has been calculated using the CKD EPI equation. This calculation has not been validated in all clinical situations. eGFR's persistently <60 mL/min signify possible Chronic Kidney Disease.    Anion gap 7 5 - 15  CBC     Status: None   Collection Time: 02/11/17  8:15 PM  Result Value Ref Range   WBC 9.8 3.8 - 10.6 K/uL   RBC 4.83 4.40 - 5.90 MIL/uL   Hemoglobin 14.4 13.0 - 18.0 g/dL   HCT 43.3 40.0 - 52.0 %   MCV 89.6 80.0 - 100.0 fL   MCH 29.9 26.0 - 34.0 pg   MCHC 33.4 32.0 - 36.0 g/dL   RDW 13.5 11.5 - 14.5 %   Platelets 201 150 - 440 K/uL  Troponin I     Status: Abnormal   Collection Time: 02/11/17  8:15 PM  Result Value Ref Range   Troponin I 0.76 (HH) <0.03 ng/mL    Comment: CRITICAL RESULT CALLED TO, READ BACK BY AND VERIFIED WITH ANDREA BRYANT 02/11/17 @ 2054  [place]  Status: None   Collection Time: 02/11/17  9:21 PM  Result Value Ref Range   Prothrombin Time 13.4 11.4 - 15.2 seconds   INR 1.03   APTT     Status: None   Collection Time: 02/11/17  9:21 PM  Result Value Ref Range   aPTT 25 24 - 36 seconds  Type and screen     Status: None   Collection Time: 02/11/17  9:47 PM  Result Value Ref Range   ABO/RH(D) A POS    Antibody Screen NEG    Sample Expiration 02/14/2017   Troponin I     Status: Abnormal   Collection Time: 02/11/17 11:09 PM  Result Value Ref Range   Troponin I 0.87 (HH) <0.03 ng/mL    Comment: CRITICAL VALUE NOTED. VALUE IS CONSISTENT WITH PREVIOUSLY REPORTED/CALLED VALUE BY CAF   T4, free     Status: None   Collection Time: 02/11/17 11:09 PM  Result Value Ref Range   Free T4 0.91 0.61 - 1.12 ng/dL    Comment: (NOTE) Biotin ingestion may interfere with  free T4 tests. If the results are inconsistent with the TSH level, previous test results, or the clinical presentation, then consider biotin interference. If needed, order repeat testing after stopping biotin.   TSH     Status: None   Collection Time: 02/11/17 11:09 PM  Result Value Ref Range   TSH 1.179 0.350 - 4.500 uIU/mL    Comment: Performed by a 3rd Generation assay with a functional sensitivity of <=0.01 uIU/mL.  Basic metabolic panel     Status: Abnormal   Collection Time: 02/12/17  5:27 AM  Result Value Ref Range   Sodium 140 135 - 145 mmol/L   Potassium 3.7 3.5 - 5.1 mmol/L   Chloride 108 101 - 111 mmol/L   CO2 27 22 - 32 mmol/L   Glucose, Bld 106 (H) 65 - 99 mg/dL   BUN 19 6 - 20 mg/dL   Creatinine, Ser 1.06 0.61 - 1.24 mg/dL   Calcium 8.5 (L) 8.9 - 10.3 mg/dL   GFR calc non Af Amer >60 >60 mL/min   GFR calc Af Amer >60 >60 mL/min    Comment: (NOTE) The eGFR has been calculated using the CKD EPI equation. This calculation has not been validated in all clinical situations. eGFR's persistently <60 mL/min signify possible Chronic Kidney Disease.    Anion gap 5 5 - 15  Lipid panel     Status: None   Collection Time: 02/12/17  5:27 AM  Result Value Ref Range   Cholesterol 150 0 - 200 mg/dL   Triglycerides 102 <150 mg/dL   HDL 43 >40 mg/dL   Total CHOL/HDL Ratio 3.5 RATIO   VLDL 20 0 - 40 mg/dL   LDL Cholesterol 87 0 - 99 mg/dL    Comment:        Total Cholesterol/HDL:CHD Risk Coronary Heart Disease Risk Table                     Men   Women  1/2 Average Risk   3.4   3.3  Average Risk       5.0   4.4  2 X Average Risk   9.6   7.1  3 X Average Risk  23.4   11.0        Use the calculated Patient Ratio above and the CHD Risk Table to determine the patient's CHD Risk.  ATP III CLASSIFICATION (LDL):  <100     mg/dL   Optimal  100-129  mg/dL   Near or Above                    Optimal  130-159  mg/dL   Borderline  160-189  mg/dL   High  >190     mg/dL    Very High   CBC     Status: None   Collection Time: 02/12/17  5:27 AM  Result Value Ref Range   WBC 7.2 3.8 - 10.6 K/uL   RBC 4.54 4.40 - 5.90 MIL/uL   Hemoglobin 13.5 13.0 - 18.0 g/dL   HCT 40.5 40.0 - 52.0 %   MCV 89.3 80.0 - 100.0 fL   MCH 29.9 26.0 - 34.0 pg   MCHC 33.4 32.0 - 36.0 g/dL   RDW 13.8 11.5 - 14.5 %   Platelets 192 150 - 440 K/uL  Protime-INR     Status: None   Collection Time: 02/12/17  5:27 AM  Result Value Ref Range   Prothrombin Time 13.8 11.4 - 15.2 seconds   INR 1.07   Troponin I     Status: Abnormal   Collection Time: 02/12/17  5:27 AM  Result Value Ref Range   Troponin I 0.72 (HH) <0.03 ng/mL    Comment: CRITICAL VALUE NOTED. VALUE IS CONSISTENT WITH PREVIOUSLY REPORTED/CALLED VALUE...[facility]    Dg Chest 2 View  Result Date: 02/11/2017 CLINICAL DATA:  Intermittent central chest pain starting yesterday. Diaphoresis. EXAM: CHEST  2 VIEW COMPARISON:  None. FINDINGS: Cardiomediastinal silhouette is normal. Mildly increased lung volumes without pleural effusion or focal consolidation. No pneumothorax. Soft tissue planes and included osseous structures are nonsuspicious. Mild degenerative change of the thoracic spine. IMPRESSION: Hyperinflation without focal consolidation. Electronically Signed   By: Elon Alas M.D.   On: 02/11/2017 20:32    Review of Systems  Constitutional: Positive for diaphoresis and malaise/fatigue.  HENT: Positive for congestion.   Eyes: Negative.   Respiratory: Positive for shortness of breath.   Cardiovascular: Positive for chest pain and palpitations.  Gastrointestinal: Negative.   Genitourinary: Negative.   Musculoskeletal: Negative.   Skin: Negative.   Neurological: Positive for weakness.  Endo/Heme/Allergies: Negative.   Psychiatric/Behavioral: Negative.    Blood pressure (!) 113/52, pulse (!) 50, temperature 98.2 F (36.8 C), temperature source Oral, resp. rate (!) 27, height '5\' 8"'  (1.727 m), weight 171 lb (77.6 kg),  SpO2 96 %. Physical Exam  Nursing note and vitals reviewed. Constitutional: He is oriented to person, place, and time. He appears well-developed and well-nourished.  HENT:  Head: Normocephalic and atraumatic.  Eyes: Pupils are equal, round, and reactive to light. Conjunctivae and EOM are normal.  Neck: Normal range of motion. Neck supple.  Cardiovascular: Normal rate and regular rhythm.   Murmur heard. Respiratory: Effort normal and breath sounds normal.  GI: Soft. Bowel sounds are normal.  Musculoskeletal: Normal range of motion.  Neurological: He is alert and oriented to person, place, and time. He has normal reflexes.  Skin: Skin is warm and dry.  Psychiatric: He has a normal mood and affect.    Assessment/Plan: Non-STEMI Coronary artery disease Unstable angina Hypertension GERD Colonic polyps . Plan Agree with telemetry Rule out for myocardial infarction follow-up EKGs and troponins Continue short-term anticoagulation Recommend cardiac catheter prior to discharge Continue hypertension control Recommend lipid management  Richard Rivas D Stacey Sago 02/12/2017, 4:36 PM

## 2017-02-12 NOTE — H&P (Signed)
[address]       [place] [address]             [phone]      Cardiothoracic Surgery Admission History and Physical   Richard Rivas is an 76 y.o. male.   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 This was fairly severe and associated with nausea, shortness of breath and fatigue. He and his wife decided to drive home. His wife drove and he continue to have waxing and waning pain during the trip home Monday night. He actually drove from [place] to [place] having some chest pain. By the time he got home the pain had resolved but recurred while he was unloading the car early Tuesday morning. He slept and the rest of the day on Tuesday he had no pain and felt fine. Then Wednesday he had recurrent pain while out picking ocra in his garden and he went to [place] yesterday evening. He had a mildly elevated troponin of 0.76 and non-specific ECG changes. He had no further pain at [facility] and had a cath this afternoon by Dr. Clayborn Bigness that showed a 90% distal LM stenosis, 90% ostial LCX, stenosis, 50% mid LAD, 50% proximal and mid RCA. LVEF was normal. He has remained stable without chest pain on heparin.  Past Medical History:  Diagnosis Date  . BPPV (benign paroxysmal positional vertigo)   . Colon polyps   . Coronary artery disease   . Depression   . GERD (gastroesophageal reflux disease)   . History of kidney stones   . Hypertension   . Kidney stones   . Myocardial infarction [facility])     Past Surgical History:  Procedure Laterality Date  . CARDIAC CATHETERIZATION    . CERVICAL DISC SURGERY    . COLON SURGERY    . COLONOSCOPY WITH PROPOFOL N/A 10/11/2015   Procedure: COLONOSCOPY WITH PROPOFOL;  Surgeon: Manya Silvas, MD;  Location:  [facility] ENDOSCOPY;  Service: Endoscopy;  Laterality: N/A;  . KIDNEY STONE SURGERY    . LAPAROSCOPIC RIGHT COLECTOMY Right 11/14/2014   Procedure: LAPAROSCOPIC RIGHT COLECTOMY;  Surgeon: Leonie Green, MD;  Location: [facility] ORS;  Service: General;  Laterality: Right;  . SHOULDER ARTHROSCOPY W/ ROTATOR CUFF REPAIR Right     History reviewed. No pertinent family history. Social History:  reports that he quit smoking about 22 years ago. He quit smokeless tobacco use about 4 years ago. He reports that he drinks alcohol. He reports that he does not use drugs.  Allergies:  Allergies  Allergen Reactions  . Bee Venom Swelling    Medications Prior to Admission  Medication Sig Dispense Refill  . aspirin EC 81 MG tablet Take 81 mg by mouth daily.     Marland Kitchen FLUoxetine (PROZAC) 20 MG capsule Take 20 mg by mouth daily.    Marland Kitchen lisinopril (PRINIVIL,ZESTRIL) 10 MG tablet Take 10 mg by mouth daily.     Marland Kitchen omeprazole (PRILOSEC) 20 MG capsule Take 20 mg by mouth daily.    . vitamin B-12 (CYANOCOBALAMIN) 1000 MCG tablet Take 1,000 mcg by mouth daily.    Marland Kitchen HYDROcodone-acetaminophen (NORCO) 5-325 MG tablet Take 1-2 tablets by mouth every 4 (four) hours as needed for moderate pain. (  Patient not taking: Reported on 02/11/2017) 20 tablet 0    Results for orders placed or performed during the hospital encounter of 02/11/17 (from the past 48 hour(s))  Basic metabolic panel     Status: Abnormal   Collection Time: 02/11/17  8:15 PM  Result Value Ref Range   Sodium 140 135 - 145 mmol/L   Potassium 4.2 3.5 - 5.1 mmol/L   Chloride 108 101 - 111 mmol/L   CO2 25 22 - 32 mmol/L   Glucose, Bld 169 (H) 65 - 99 mg/dL   BUN 20 6 - 20 mg/dL   Creatinine, Ser 1.24 0.61 - 1.24 mg/dL   Calcium 9.3 8.9 - 10.3 mg/dL   GFR calc non Af Amer 56 (L) >60 mL/min   GFR calc Af Amer >60 >60 mL/min    Comment: (NOTE) The eGFR has been calculated using the CKD EPI equation. This calculation has not been validated in all clinical  situations. eGFR's persistently <60 mL/min signify possible Chronic Kidney Disease.    Anion gap 7 5 - 15  CBC     Status: None   Collection Time: 02/11/17  8:15 PM  Result Value Ref Range   WBC 9.8 3.8 - 10.6 K/uL   RBC 4.83 4.40 - 5.90 MIL/uL   Hemoglobin 14.4 13.0 - 18.0 g/dL   HCT 43.3 40.0 - 52.0 %   MCV 89.6 80.0 - 100.0 fL   MCH 29.9 26.0 - 34.0 pg   MCHC 33.4 32.0 - 36.0 g/dL   RDW 13.5 11.5 - 14.5 %   Platelets 201 150 - 440 K/uL  Troponin I     Status: Abnormal   Collection Time: 02/11/17  8:15 PM  Result Value Ref Range   Troponin I 0.76 (HH) <0.03 ng/mL    Comment: CRITICAL RESULT CALLED TO, READ BACK BY AND VERIFIED WITH ANDREA BRYANT 02/11/17 @ 2054  [place]   Protime-INR     Status: None   Collection Time: 02/11/17  9:21 PM  Result Value Ref Range   Prothrombin Time 13.4 11.4 - 15.2 seconds   INR 1.03   APTT     Status: None   Collection Time: 02/11/17  9:21 PM  Result Value Ref Range   aPTT 25 24 - 36 seconds  Type and screen     Status: None   Collection Time: 02/11/17  9:47 PM  Result Value Ref Range   ABO/RH(D) A POS    Antibody Screen NEG    Sample Expiration 02/14/2017   Troponin I     Status: Abnormal   Collection Time: 02/11/17 11:09 PM  Result Value Ref Range   Troponin I 0.87 (HH) <0.03 ng/mL    Comment: CRITICAL VALUE NOTED. VALUE IS CONSISTENT WITH PREVIOUSLY REPORTED/CALLED VALUE BY CAF   T4, free     Status: None   Collection Time: 02/11/17 11:09 PM  Result Value Ref Range   Free T4 0.91 0.61 - 1.12 ng/dL    Comment: (NOTE) Biotin ingestion may interfere with free T4 tests. If the results are inconsistent with the TSH level, previous test results, or the clinical presentation, then consider biotin interference. If needed, order repeat testing after stopping biotin.   TSH     Status: None   Collection Time: 02/11/17 11:09 PM  Result Value Ref Range   TSH 1.179 0.350 - 4.500 uIU/mL    Comment: Performed by a 3rd Generation assay with a  functional sensitivity of <=0.01 uIU/mL.  Basic metabolic  panel     Status: Abnormal   Collection Time: 02/12/17  5:27 AM  Result Value Ref Range   Sodium 140 135 - 145 mmol/L   Potassium 3.7 3.5 - 5.1 mmol/L   Chloride 108 101 - 111 mmol/L   CO2 27 22 - 32 mmol/L   Glucose, Bld 106 (H) 65 - 99 mg/dL   BUN 19 6 - 20 mg/dL   Creatinine, Ser 1.06 0.61 - 1.24 mg/dL   Calcium 8.5 (L) 8.9 - 10.3 mg/dL   GFR calc non Af Amer >60 >60 mL/min   GFR calc Af Amer >60 >60 mL/min    Comment: (NOTE) The eGFR has been calculated using the CKD EPI equation. This calculation has not been validated in all clinical situations. eGFR's persistently <60 mL/min signify possible Chronic Kidney Disease.    Anion gap 5 5 - 15  Lipid panel     Status: None   Collection Time: 02/12/17  5:27 AM  Result Value Ref Range   Cholesterol 150 0 - 200 mg/dL   Triglycerides 102 <150 mg/dL   HDL 43 >40 mg/dL   Total CHOL/HDL Ratio 3.5 RATIO   VLDL 20 0 - 40 mg/dL   LDL Cholesterol 87 0 - 99 mg/dL    Comment:        Total Cholesterol/HDL:CHD Risk Coronary Heart Disease Risk Table                     Men   Women  1/2 Average Risk   3.4   3.3  Average Risk       5.0   4.4  2 X Average Risk   9.6   7.1  3 X Average Risk  23.4   11.0        Use the calculated Patient Ratio above and the CHD Risk Table to determine the patient's CHD Risk.        ATP III CLASSIFICATION (LDL):  <100     mg/dL   Optimal  100-129  mg/dL   Near or Above                    Optimal  130-159  mg/dL   Borderline  160-189  mg/dL   High  >190     mg/dL   Very High   CBC     Status: None   Collection Time: 02/12/17  5:27 AM  Result Value Ref Range   WBC 7.2 3.8 - 10.6 K/uL   RBC 4.54 4.40 - 5.90 MIL/uL   Hemoglobin 13.5 13.0 - 18.0 g/dL   HCT 40.5 40.0 - 52.0 %   MCV 89.3 80.0 - 100.0 fL   MCH 29.9 26.0 - 34.0 pg   MCHC 33.4 32.0 - 36.0 g/dL   RDW 13.8 11.5 - 14.5 %   Platelets 192 150 - 440 K/uL  Protime-INR     Status: None     Collection Time: 02/12/17  5:27 AM  Result Value Ref Range   Prothrombin Time 13.8 11.4 - 15.2 seconds   INR 1.07   Troponin I     Status: Abnormal   Collection Time: 02/12/17  5:27 AM  Result Value Ref Range   Troponin I 0.72 (HH) <0.03 ng/mL    Comment: CRITICAL VALUE NOTED. VALUE IS CONSISTENT WITH PREVIOUSLY REPORTED/CALLED VALUE...[facility]   Dg Chest 2 View  Result Date: 02/11/2017 CLINICAL DATA:  Intermittent central chest pain starting yesterday. Diaphoresis. EXAM: CHEST  2 VIEW COMPARISON:  None. FINDINGS: Cardiomediastinal silhouette is normal. Mildly increased lung volumes without pleural effusion or focal consolidation. No pneumothorax. Soft tissue planes and included osseous structures are nonsuspicious. Mild degenerative change of the thoracic spine. IMPRESSION: Hyperinflation without focal consolidation. Electronically Signed   By: Elon Alas M.D.   On: 02/11/2017 20:32    Review of Systems  Constitutional: Positive for diaphoresis and malaise/fatigue. Negative for chills, fever and weight loss.  HENT: Negative.   Eyes: Negative.   Respiratory: Positive for shortness of breath. Negative for cough.   Cardiovascular: Positive for chest pain. Negative for orthopnea, leg swelling and PND.  Gastrointestinal: Positive for nausea and vomiting. Negative for abdominal pain, blood in stool, constipation and diarrhea.  Genitourinary: Negative.   Musculoskeletal: Negative.   Skin: Negative.   Neurological: Negative.   Endo/Heme/Allergies: Negative.  Polydipsia:    Psychiatric/Behavioral: Positive for depression.    Blood pressure 122/67, pulse (!) 50, temperature 98.2 F (36.8 C), temperature source Oral, resp. rate (!) 27, height '5\' 8"'  (1.727 m), weight 77.6 kg (171 lb), SpO2 97 %. Physical Exam  Constitutional: He is oriented to person, place, and time. He appears well-developed and well-nourished. No distress.  HENT:  Head: Normocephalic and atraumatic.  Mouth/Throat:  Oropharynx is clear and moist.  Eyes: Pupils are equal, round, and reactive to light. EOM are normal.  Neck: Normal range of motion. Neck supple. No JVD present. No tracheal deviation present. No thyromegaly present.  Cardiovascular: Normal rate, regular rhythm, normal heart sounds and intact distal pulses.  Exam reveals no gallop and no friction rub.   No murmur heard. Respiratory: Effort normal and breath sounds normal. No respiratory distress.  GI: Soft. Bowel sounds are normal. He exhibits no distension and no mass. There is no tenderness.  Musculoskeletal: Normal range of motion. He exhibits no edema.  Lymphadenopathy:    He has no cervical adenopathy.  Neurological: He is alert and oriented to person, place, and time. He has normal strength. No cranial nerve deficit or sensory deficit.  Skin: Skin is warm and dry.  Psychiatric: He has a normal mood and affect.    Physicians   Panel Physicians Referring Physician Case Authorizing Physician  Yolonda Kida, MD (Primary)  Lujean Amel D, MD  Procedures   LEFT HEART CATH AND CORONARY ANGIOGRAPHY and possible pci  Conclusion     Mid RCA lesion, 50 %stenosed.  Prox RCA lesion, 50 %stenosed.  LM lesion, 99 %stenosed.  Ost Cx lesion, 99 %stenosed.  Dist LAD lesion, 50 %stenosed.  The left ventricular systolic function is normal.  LV end diastolic pressure is normal.  The left ventricular ejection fraction is 55-65% by visual estimate.   Conclusion Diagnostic cardiac catheter with normal left ventricular function Severe distal left main disease 99% Severe ostial circumflex disease 99% Moderate LAD disease Moderate RCA disease Recommend transferred for coronary bypass surgery at [facility]   Procedural Details/Technique   Technical Details Diagnostic cardiac catheter because of non-STEMI with elevated troponins. Patient was brought to the cardiovascular cath lab and placed on table, 5 French sheath placed in  the right femoral artery after 20 cc of 1% lidocaine was used to anesthetize the area. Diagnostic left ventricular gram was performed which showed normal overall left ventricular function of 55%. 5 [place] pigtail was used for the procedure. Next we used a 4 curve 5 French left Judkins catheter access the left main system. Ostial left main had mild disease distal left main  had severe 99% disease. Ostial circumflex was 99% LAD had mild to moderate disease. Patient appeared to have good target vessels. Right Judkins catheter was used to access the right coronary artery which had mild to moderate disease. Patient had moderate calcification in the proximal vessels. Catheters removed sheath removed and a minx deployed. Arrangements were made with [facility] with Dr. Earlene Plater to have the patient transferred for coronary bypass surgery Discuss case with the hospitalist and family.   Estimated blood loss <50 mL.  During this procedure the patient was administered the following to achieve and maintain moderate conscious sedation: Versed 1 mg, Fentanyl 25 mcg, while the patient's heart rate, blood pressure, and oxygen saturation were continuously monitored. The period of conscious sedation was 28 minutes, of which I was present face-to-face 100% of this time.    Coronary Findings   Dominance: Co-dominant  Left Main  LM lesion, 99% stenosed. Culprit lesion. The lesion is type C, located proximal to the major branch, focal, discrete and eccentric. The lesion is moderately calcified. The lesion was not previously treated. The stenosis was measured by a visual reading. Pressure wire/FFR was not performed on the lesion. IVUS was not performed on the lesion.  Left Anterior Descending  Dist LAD lesion, 50% stenosed. Not the culprit lesion. The lesion is type B1, located at the major branch, discrete and concentric. The lesion was not previously treated. The stenosis was measured by a visual reading. Pressure wire/FFR  was not performed on the lesion. IVUS was not performed on the lesion.  Left Circumflex  Ost Cx lesion, 99% stenosed. Culprit lesion. The lesion is type C, located at the major branch and discrete. The lesion was not previously treated. The stenosis was measured by a visual reading. Pressure wire/FFR was not performed on the lesion. IVUS was not performed on the lesion.  Right Coronary Artery  Prox RCA lesion, 50% stenosed. Not the culprit lesion. The lesion is type C, located at the bend, eccentric, concentric and irregular. The lesion was not previously treated. The stenosis was measured by a visual reading. Pressure wire/FFR was not performed on the lesion. IVUS was not performed on the lesion.  Mid RCA lesion, 50% stenosed. Not the culprit lesion. The lesion is type B1, located at the major branch, focal, discrete and eccentric. The lesion was not previously treated. The stenosis was measured by a visual reading. Pressure wire/FFR was not performed on the lesion. IVUS was not performed on the lesion.  Wall Motion   Resting       Normal LVF EF=55%          Left Heart   Left Ventricle The left ventricular size is normal. The left ventricular systolic function is normal. LV end diastolic pressure is normal. Calculated EF is 55%. The left ventricular ejection fraction is 55-65% by visual estimate. No regional wall motion abnormalities.    Coronary Diagrams   Diagnostic Diagram        Assessment/Plan  This 76 year old gentleman has high grade distal LM and ostial LCX stenosis with moderate LAD and RCA stenoses and presented with stuttering chest pain of several days duration and a small NSTEMI. I agree that urgent CABG is indicated in this patient. He has not had any further chest pain since presentation to [facility] and is on a heparin drip. As long as he is free of chest pain I will plan to do CABG tomorrow. If he develops any symptoms then I will proceed immediately.  Continue heparin  until surgery. I discussed the operative procedure with the patient  including alternatives, benefits and risks; including but not limited to bleeding, blood transfusion, infection, stroke, myocardial infarction, graft failure, heart block requiring a permanent pacemaker, organ dysfunction, and death.  Richard Rivas understands and agrees to proceed.       [name], MD 02/12/2017, 7:23 PM

## 2017-02-12 NOTE — Progress Notes (Signed)
Orders to transfer to [facility] for CABG / belongings returned to family / will be discharged from cath lab

## 2017-02-12 NOTE — Progress Notes (Signed)
MD notified of increase in troponin level. Acknowledged. No new orders placed. Asymptomatic Will monitor

## 2017-02-12 NOTE — Discharge Summary (Signed)
[facility] at [place] NAME: Richard Rivas    MR#:  [id number]  DATE OF BIRTH:  1941-05-24  DATE OF ADMISSION:  02/11/2017 ADMITTING PHYSICIAN: Harvie Bridge, DO  DATE OF DISCHARGE: 02/12/2017  PRIMARY CARE PHYSICIAN: Rusty Aus, MD    ADMISSION DIAGNOSIS:  Chest pain, unspecified type [R07.9]  DISCHARGE DIAGNOSIS:  Active Problems:   NSTEMI (non-ST elevated myocardial infarction) ([place])   SECONDARY DIAGNOSIS:   Past Medical History:  Diagnosis Date  . BPPV (benign paroxysmal positional vertigo)   . Colon polyps   . Kidney stones     HOSPITAL COURSE:   76 year old male with past medical history of nephrolithiasis, vertigo, history of colonic polyps, essential hypertension, GERD, depression who presented to the hospital due to chest pain and noted to have a mildly elevated troponin.  1. Non-ST elevation MI-this was a suspected diagnosis given patient's symptoms worrisome for angina along with elevated troponin. - seen by cardiology and s/p Cardiac Cath showing Left Main disease and will need evaluation for CABG. Being transferred to [facility] for eval by CT surgery.  -Patient is presently chest pain-free and hemodynamically stable.   - cont, ASA, Metoprolol, Atorvastatin, heparin gtt, Nitro.    2. HTN - cont. Lisinopril, Metoprolol.   3. Depression - cont. Prozac  4. GERD - cont. Protonix.   D/c to [facility] for [facility] for [facility]G.   DISCHARGE CONDITIONS:   Stable.   CONSULTS OBTAINED:  Treatment Team:  [name], MD  DRUG ALLERGIES:   Allergies  Allergen Reactions  . Bee Venom Swelling    DISCHARGE MEDICATIONS:   Allergies as of 02/12/2017      Reactions   Bee Venom Swelling      Medication List    STOP taking these medications   HYDROcodone-acetaminophen 5-325 MG tablet Commonly known as:  NORCO     TAKE these medications   aspirin EC 81 MG tablet Take 81 mg by mouth daily.    atorvastatin 80 MG tablet Commonly known as:  LIPITOR Take 1 tablet (80 mg total) by mouth daily at 6 PM.   FLUoxetine 20 MG capsule Commonly known as:  PROZAC Take 20 mg by mouth daily.   heparin 100-0.45 UNIT/ML-% infusion Inject 1,000 Units/hr into the vein continuous.   lisinopril 10 MG tablet Commonly known as:  PRINIVIL,ZESTRIL Take 10 mg by mouth daily.   metoprolol tartrate 25 MG tablet Commonly known as:  LOPRESSOR Take 0.5 tablets (12.5 mg total) by mouth 2 (two) times daily.   omeprazole 20 MG capsule Commonly known as:  PRILOSEC Take 20 mg by mouth daily.   vitamin B-12 1000 MCG tablet Commonly known as:  CYANOCOBALAMIN Take 1,000 mcg by mouth daily.            Discharge Care Instructions        Start     Ordered   02/12/17 0000  atorvastatin (LIPITOR) 80 MG tablet  Daily-1800     02/12/17 1620   02/12/17 0000  heparin 100-0.45 UNIT/ML-% infusion  Continuous     02/12/17 1620   02/12/17 0000  metoprolol tartrate (LOPRESSOR) 25 MG tablet  2 times daily     02/12/17 1620        DISCHARGE INSTRUCTIONS:   DIET:  Cardiac diet  DISCHARGE CONDITION:  Stable  ACTIVITY:  Activity as tolerated  OXYGEN:  Home Oxygen: No.   Oxygen Delivery: room air  DISCHARGE LOCATION:  [facility]   If you experience worsening of your admission symptoms, develop shortness of breath, life threatening emergency, suicidal or homicidal thoughts you must seek medical attention immediately by calling 911 or calling your MD immediately  if symptoms less severe.  You Must read complete instructions/literature along with all the possible adverse reactions/side effects for all the Medicines you take and that have been prescribed to you. Take any new Medicines after you have completely understood and accpet all the possible adverse reactions/side effects.   Please note  You were cared for by a hospitalist during your hospital stay. If you have any questions about your  discharge medications or the care you received while you were in the hospital after you are discharged, you can call the unit and asked to speak with the hospitalist on call if the hospitalist that took care of you is not available. Once you are discharged, your primary care physician will handle any further medical issues. Please note that NO REFILLS for any discharge medications will be authorized once you are discharged, as it is imperative that you return to your primary care physician (or establish a relationship with a primary care physician if you do not have one) for your aftercare needs so that they can reassess your need for medications and monitor your lab values.    DATA REVIEW:   CBC  Recent Labs Lab 02/12/17 0527  WBC 7.2  HGB 13.5  HCT 40.5  PLT 192    Chemistries   Recent Labs Lab 02/12/17 0527  NA 140  K 3.7  CL 108  CO2 27  GLUCOSE 106*  BUN 19  CREATININE 1.06  CALCIUM 8.5*    Cardiac Enzymes  Recent Labs Lab 02/12/17 0527  TROPONINI 0.72*    Microbiology Results  No results found for this or any previous visit.  RADIOLOGY:  Dg Chest 2 View  Result Date: 02/11/2017 CLINICAL DATA:  Intermittent central chest pain starting yesterday. Diaphoresis. EXAM: CHEST  2 VIEW COMPARISON:  None. FINDINGS: Cardiomediastinal silhouette is normal. Mildly increased lung volumes without pleural effusion or focal consolidation. No pneumothorax. Soft tissue planes and included osseous structures are nonsuspicious. Mild degenerative change of the thoracic spine. IMPRESSION: Hyperinflation without focal consolidation. Electronically Signed   By: Elon Alas M.D.   On: 02/11/2017 20:32      Management plans discussed with the patient, family and they are in agreement.  CODE STATUS:     Code Status Orders        Start     Ordered   02/12/17 0013  Full code  Continuous     02/12/17 0012    Code Status History    Date Active Date Inactive Code Status  Order ID Comments User Context   11/14/2014 11:44 AM 11/18/2014  1:58 PM Full Code [id number]  Leonie Green, MD Inpatient      TOTAL TIME TAKING CARE OF THIS PATIENT: 40 minutes.    Henreitta Leber M.D on 02/12/2017 at 4:21 PM  Between 7am to 6pm - Pager - [phone]  After 6pm go to [email or web address] - Proofreader  [facility]  Office  [phone]  CC: Primary care physician; Rusty Aus, MD

## 2017-02-12 NOTE — Progress Notes (Signed)
Richard Rivas at [facility] NAME: Richard Rivas    MR#:  [id number]  DATE OF BIRTH:  [birth year]  SUBJECTIVE:   Pt. Her due to Chest pain and noted to have a mildly elevated Trop.  Going for Cardiac Cath today. Family at bedside.   REVIEW OF SYSTEMS:    Review of Systems  Constitutional: Negative for chills and fever.  HENT: Negative for congestion and tinnitus.   Eyes: Negative for blurred vision and double vision.  Respiratory: Negative for cough, shortness of breath and wheezing.   Cardiovascular: Negative for chest pain, orthopnea and PND.  Gastrointestinal: Negative for abdominal pain, diarrhea, nausea and vomiting.  Genitourinary: Negative for dysuria and hematuria.  Neurological: Negative for dizziness, sensory change and focal weakness.  All other systems reviewed and are negative.   Nutrition: heart healthy Tolerating Diet: yes Tolerating PT: Ambulatory  DRUG ALLERGIES:   Allergies  Allergen Reactions  . Bee Venom Swelling    VITALS:  Blood pressure 115/70, pulse (!) 51, temperature 98.2 F (36.8 C), temperature source Oral, resp. rate 19, height 5\' 8"  (1.727 m), weight 77.6 kg (171 lb), SpO2 96 %.  PHYSICAL EXAMINATION:   Physical Exam  GENERAL:  76 y.o.-year-old patient lying in bed in no acute distress.  EYES: Pupils equal, round, reactive to light and accommodation. No scleral icterus. Extraocular muscles intact.  HEENT: Head atraumatic, normocephalic. Oropharynx and nasopharynx clear.  NECK:  Supple, no jugular venous distention. No thyroid enlargement, no tenderness.  LUNGS: Normal breath sounds bilaterally, no wheezing, rales, rhonchi. No use of accessory muscles of respiration.  CARDIOVASCULAR: S1, S2 normal. No murmurs, rubs, or gallops.  ABDOMEN: Soft, nontender, nondistended. Bowel sounds present. No organomegaly or mass.  EXTREMITIES: No cyanosis, clubbing or edema b/l.    NEUROLOGIC: Cranial nerves II through XII  are intact. No focal Motor or sensory deficits b/l.   PSYCHIATRIC: The patient is alert and oriented x 3.  SKIN: No obvious rash, lesion, or ulcer.    LABORATORY PANEL:   CBC  Recent Labs Lab 02/12/17 0527  WBC 7.2  HGB 13.5  HCT 40.5  PLT 192   ------------------------------------------------------------------------------------------------------------------  Chemistries   Recent Labs Lab 02/12/17 0527  NA 140  K 3.7  CL 108  CO2 27  GLUCOSE 106*  BUN 19  CREATININE 1.06  CALCIUM 8.5*   ------------------------------------------------------------------------------------------------------------------  Cardiac Enzymes  Recent Labs Lab 02/12/17 0527  TROPONINI 0.72*   ------------------------------------------------------------------------------------------------------------------  RADIOLOGY:  Dg Chest 2 View  Result Date: 02/11/2017 CLINICAL DATA:  Intermittent central chest pain starting yesterday. Diaphoresis. EXAM: CHEST  2 VIEW COMPARISON:  None. FINDINGS: Cardiomediastinal silhouette is normal. Mildly increased lung volumes without pleural effusion or focal consolidation. No pneumothorax. Soft tissue planes and included osseous structures are nonsuspicious. Mild degenerative change of the thoracic spine. IMPRESSION: Hyperinflation without focal consolidation. Electronically Signed   By: Elon Alas M.D.   On: 02/11/2017 20:32     ASSESSMENT AND PLAN:   76 year old male with past medical history of nephrolithiasis, vertigo, history of colonic polyps, essential hypertension, GERD, depression who presented to the hospital due to chest pain and noted to have a mildly elevated troponin.  1. Non-ST elevation MI-this was a suspected diagnosis given patient's symptoms worrisome for angina along with elevated troponin. -Patient is presently chest pain-free and hemodynamically stable.  Seen by Cardiology and plan for cardiac  Catheterization later today.  -  cont, ASA, Metoprolol, Atorvastatin, Lovenox, Nitro.  -  await Cath results. Await Echo results.   2. HTN - cont. Lisinopril, Metoprolol.   3. Depression - cont. Prozac  4. GERD - cont. Protonix.   All the records are reviewed and case discussed with Care Management/Social Worker. Management plans discussed with the patient, family and they are in agreement.  CODE STATUS: Full code  DVT Prophylaxis: Lovenox  TOTAL TIME TAKING CARE OF THIS PATIENT: 30 minutes.   POSSIBLE D/C IN 1-2 DAYS, DEPENDING ON CLINICAL CONDITION.   Henreitta Leber M.D on 02/12/2017 at 3:21 PM  Between 7am to 6pm - Pager - [phone]  After 6pm go to [email or web address] - Proofreader  [facility]  Office  [phone]  CC: Primary care physician; Rusty Aus, MD

## 2017-02-13 ENCOUNTER — Inpatient Hospital Stay (HOSPITAL_COMMUNITY)
Admission: AD | Disposition: A | Discharge status: Home / Self Care | Payer: Self-pay | Source: Other Acute Inpatient Hospital | Attending: Surgery

## 2017-02-13 ENCOUNTER — Inpatient Hospital Stay (HOSPITAL_COMMUNITY): Payer: PPO

## 2017-02-13 ENCOUNTER — Inpatient Hospital Stay (HOSPITAL_COMMUNITY): Payer: PPO | Admitting: Certified Registered Nurse Anesthetist

## 2017-02-13 ENCOUNTER — Encounter: Payer: Self-pay | Admitting: Internal Medicine

## 2017-02-13 ENCOUNTER — Inpatient Hospital Stay (HOSPITAL_COMMUNITY): Admission: RE | Admit: 2017-02-13 | Payer: PPO | Source: Ambulatory Visit | Admitting: Surgery

## 2017-02-13 DIAGNOSIS — I2510 Atherosclerotic heart disease of native coronary artery without angina pectoris: Secondary | ICD-10-CM | POA: Diagnosis present

## 2017-02-13 DIAGNOSIS — Z951 Presence of aortocoronary bypass graft: Secondary | ICD-10-CM

## 2017-02-13 DIAGNOSIS — Z01810 Encounter for preprocedural cardiovascular examination: Secondary | ICD-10-CM

## 2017-02-13 HISTORY — PX: TEE WITHOUT CARDIOVERSION: SHX5443

## 2017-02-13 HISTORY — DX: Presence of aortocoronary bypass graft: Z95.1

## 2017-02-13 HISTORY — PX: CORONARY ARTERY BYPASS GRAFT: SHX141

## 2017-02-13 LAB — POCT I-STAT, CHEM 8
BUN: 11 mg/dL (ref 6–20)
BUN: 11 mg/dL (ref 6–20)
BUN: 10 mg/dL (ref 6–20)
BUN: 10 mg/dL (ref 6–20)
BUN: 11 mg/dL (ref 6–20)
CALCIUM ION: 1.03 mmol/L — AB (ref 1.15–1.40)
CALCIUM ION: 1.08 mmol/L — AB (ref 1.15–1.40)
CHLORIDE: 105 mmol/L (ref 101–111)
CHLORIDE: 104 mmol/L (ref 101–111)
CHLORIDE: 103 mmol/L (ref 101–111)
CHLORIDE: 103 mmol/L (ref 101–111)
CHLORIDE: 104 mmol/L (ref 101–111)
CREATININE: 0.6 mg/dL — AB (ref 0.61–1.24)
Calcium, Ion: 1.24 mmol/L (ref 1.15–1.40)
Calcium, Ion: 1.18 mmol/L (ref 1.15–1.40)
Calcium, Ion: 1.07 mmol/L — ABNORMAL LOW (ref 1.15–1.40)
Creatinine, Ser: 0.7 mg/dL (ref 0.61–1.24)
Creatinine, Ser: 0.7 mg/dL (ref 0.61–1.24)
Creatinine, Ser: 0.6 mg/dL — ABNORMAL LOW (ref 0.61–1.24)
Creatinine, Ser: 0.6 mg/dL — ABNORMAL LOW (ref 0.61–1.24)
GLUCOSE: 95 mg/dL (ref 65–99)
GLUCOSE: 103 mg/dL — AB (ref 65–99)
GLUCOSE: 129 mg/dL — AB (ref 65–99)
Glucose, Bld: 100 mg/dL — ABNORMAL HIGH (ref 65–99)
Glucose, Bld: 142 mg/dL — ABNORMAL HIGH (ref 65–99)
HCT: 36 % — ABNORMAL LOW (ref 39.0–52.0)
HCT: 28 % — ABNORMAL LOW (ref 39.0–52.0)
HEMATOCRIT: 38 % — AB (ref 39.0–52.0)
HEMATOCRIT: 30 % — AB (ref 39.0–52.0)
HEMATOCRIT: 29 % — AB (ref 39.0–52.0)
HEMOGLOBIN: 12.2 g/dL — AB (ref 13.0–17.0)
Hemoglobin: 12.9 g/dL — ABNORMAL LOW (ref 13.0–17.0)
Hemoglobin: 10.2 g/dL — ABNORMAL LOW (ref 13.0–17.0)
Hemoglobin: 9.5 g/dL — ABNORMAL LOW (ref 13.0–17.0)
Hemoglobin: 9.9 g/dL — ABNORMAL LOW (ref 13.0–17.0)
POTASSIUM: 4 mmol/L (ref 3.5–5.1)
POTASSIUM: 4.9 mmol/L (ref 3.5–5.1)
Potassium: 3.9 mmol/L (ref 3.5–5.1)
Potassium: 4 mmol/L (ref 3.5–5.1)
Potassium: 4.6 mmol/L (ref 3.5–5.1)
SODIUM: 140 mmol/L (ref 135–145)
SODIUM: 140 mmol/L (ref 135–145)
SODIUM: 139 mmol/L (ref 135–145)
Sodium: 142 mmol/L (ref 135–145)
Sodium: 139 mmol/L (ref 135–145)
TCO2: 27 mmol/L (ref 22–32)
TCO2: 24 mmol/L (ref 22–32)
TCO2: 28 mmol/L (ref 22–32)
TCO2: 25 mmol/L (ref 22–32)
TCO2: 26 mmol/L (ref 22–32)

## 2017-02-13 LAB — POCT I-STAT 3, ART BLOOD GAS (G3+)
ACID-BASE EXCESS: 3 mmol/L — AB (ref 0.0–2.0)
ACID-BASE EXCESS: 2 mmol/L (ref 0.0–2.0)
Acid-Base Excess: 6 mmol/L — ABNORMAL HIGH (ref 0.0–2.0)
Acid-base deficit: 2 mmol/L (ref 0.0–2.0)
Bicarbonate: 30.1 mmol/L — ABNORMAL HIGH (ref 20.0–28.0)
Bicarbonate: 25.9 mmol/L (ref 20.0–28.0)
Bicarbonate: 26.1 mmol/L (ref 20.0–28.0)
Bicarbonate: 23.7 mmol/L (ref 20.0–28.0)
O2 SAT: 100 %
O2 SAT: 100 %
O2 SAT: 99 %
O2 Saturation: 98 %
PCO2 ART: 43.4 mmHg (ref 32.0–48.0)
PCO2 ART: 34.1 mmHg (ref 32.0–48.0)
PCO2 ART: 44.1 mmHg (ref 32.0–48.0)
PH ART: 7.449 (ref 7.350–7.450)
PH ART: 7.338 — AB (ref 7.350–7.450)
PO2 ART: 409 mmHg — AB (ref 83.0–108.0)
PO2 ART: 372 mmHg — AB (ref 83.0–108.0)
PO2 ART: 150 mmHg — AB (ref 83.0–108.0)
Patient temperature: 36.3
Patient temperature: 37
TCO2: 31 mmol/L (ref 22–32)
TCO2: 27 mmol/L (ref 22–32)
TCO2: 27 mmol/L (ref 22–32)
TCO2: 25 mmol/L (ref 22–32)
pCO2 arterial: 38.7 mmHg (ref 32.0–48.0)
pH, Arterial: 7.488 — ABNORMAL HIGH (ref 7.350–7.450)
pH, Arterial: 7.434 (ref 7.350–7.450)
pO2, Arterial: 118 mmHg — ABNORMAL HIGH (ref 83.0–108.0)

## 2017-02-13 LAB — PROTIME-INR
INR: 1.26
Prothrombin Time: 15.7 seconds — ABNORMAL HIGH (ref 11.4–15.2)

## 2017-02-13 LAB — HEMOGLOBIN AND HEMATOCRIT, BLOOD
HEMATOCRIT: 27.5 % — AB (ref 39.0–52.0)
HEMOGLOBIN: 9 g/dL — AB (ref 13.0–17.0)

## 2017-02-13 LAB — GLUCOSE, CAPILLARY
GLUCOSE-CAPILLARY: 89 mg/dL (ref 65–99)
GLUCOSE-CAPILLARY: 112 mg/dL — AB (ref 65–99)
Glucose-Capillary: 95 mg/dL (ref 65–99)
Glucose-Capillary: 109 mg/dL — ABNORMAL HIGH (ref 65–99)

## 2017-02-13 LAB — CBC
HEMATOCRIT: 34 % — AB (ref 39.0–52.0)
Hemoglobin: 11 g/dL — ABNORMAL LOW (ref 13.0–17.0)
MCH: 28.8 pg (ref 26.0–34.0)
MCHC: 32.4 g/dL (ref 30.0–36.0)
MCV: 89 fL (ref 78.0–100.0)
Platelets: 127 10*3/uL — ABNORMAL LOW (ref 150–400)
RBC: 3.82 MIL/uL — ABNORMAL LOW (ref 4.22–5.81)
RDW: 13.4 % (ref 11.5–15.5)
WBC: 11.9 10*3/uL — ABNORMAL HIGH (ref 4.0–10.5)

## 2017-02-13 LAB — PLATELET COUNT: Platelets: 128 10*3/uL — ABNORMAL LOW (ref 150–400)

## 2017-02-13 LAB — POCT I-STAT 4, (NA,K, GLUC, HGB,HCT)
GLUCOSE: 107 mg/dL — AB (ref 65–99)
HCT: 32 % — ABNORMAL LOW (ref 39.0–52.0)
HEMOGLOBIN: 10.9 g/dL — AB (ref 13.0–17.0)
Potassium: 3.6 mmol/L (ref 3.5–5.1)
SODIUM: 142 mmol/L (ref 135–145)

## 2017-02-13 LAB — APTT: aPTT: 25 seconds (ref 24–36)

## 2017-02-13 LAB — TYPE AND SCREEN
ABO/RH(D): A POS
Antibody Screen: NEGATIVE

## 2017-02-13 LAB — SURGICAL PCR SCREEN
MRSA, PCR: NEGATIVE
Staphylococcus aureus: NEGATIVE

## 2017-02-13 LAB — HEPARIN LEVEL (UNFRACTIONATED): HEPARIN UNFRACTIONATED: 0.28 [IU]/mL — AB (ref 0.30–0.70)

## 2017-02-13 LAB — ABO/RH: ABO/RH(D): A POS

## 2017-02-13 SURGERY — CORONARY ARTERY BYPASS GRAFTING (CABG)
Anesthesia: General | Site: Chest

## 2017-02-13 MED ORDER — CHLORHEXIDINE GLUCONATE 0.12% ORAL RINSE (MEDLINE KIT): 15.0000 mL | Freq: Two times a day (BID) | OROMUCOSAL | Status: DC

## 2017-02-13 MED ORDER — FENTANYL CITRATE (PF) 250 MCG/5ML IJ SOLN
INTRAMUSCULAR | Status: AC
  Filled 2017-02-13: qty 5

## 2017-02-13 MED ORDER — PLASMA-LYTE 148 IV SOLN
INTRAVENOUS | Status: DC
  Filled 2017-02-13: qty 2.5

## 2017-02-13 MED ORDER — EPHEDRINE SULFATE 50 MG/ML IJ SOLN
INTRAMUSCULAR | Status: DC | PRN
  Administered 2017-02-13: 5 mg via INTRAVENOUS

## 2017-02-13 MED ORDER — VECURONIUM BROMIDE 10 MG IV SOLR
INTRAVENOUS | Status: DC | PRN
  Administered 2017-02-13 (×2): 5 mg via INTRAVENOUS

## 2017-02-13 MED ORDER — NITROGLYCERIN 0.2 MG/ML ON CALL CATH LAB
INTRAVENOUS | Status: DC | PRN
  Administered 2017-02-13 (×2): 40 ug via INTRAVENOUS

## 2017-02-13 MED ORDER — MAGNESIUM SULFATE 50 % IJ SOLN
40.0000 meq | INTRAMUSCULAR | Status: DC
  Filled 2017-02-13: qty 10

## 2017-02-13 MED ORDER — FENTANYL CITRATE (PF) 250 MCG/5ML IJ SOLN
INTRAMUSCULAR | Status: AC
  Filled 2017-02-13: qty 20

## 2017-02-13 MED ORDER — POTASSIUM CHLORIDE 2 MEQ/ML IV SOLN
80.0000 meq | INTRAVENOUS | Status: DC
  Filled 2017-02-13: qty 40

## 2017-02-13 MED ORDER — SODIUM CHLORIDE 0.9 % IV SOLN
0.0000 ug/kg/h | INTRAVENOUS | Status: DC
  Filled 2017-02-13: qty 2

## 2017-02-13 MED ORDER — FLUOXETINE HCL 20 MG PO CAPS
20.0000 mg | ORAL_CAPSULE | Freq: Every day | ORAL | Status: DC
  Administered 2017-02-14 – 2017-02-18 (×5): 20 mg via ORAL
  Filled 2017-02-13 (×5): qty 1

## 2017-02-13 MED ORDER — ACETAMINOPHEN 650 MG RE SUPP
650.0000 mg | Freq: Once | RECTAL | Status: AC
  Administered 2017-02-13: 650 mg via RECTAL

## 2017-02-13 MED ORDER — LACTATED RINGERS IV SOLN
INTRAVENOUS | Status: DC | PRN
  Administered 2017-02-13: 13:00:00 via INTRAVENOUS

## 2017-02-13 MED ORDER — MIDAZOLAM HCL 10 MG/2ML IJ SOLN
INTRAMUSCULAR | Status: AC
Start: 2017-02-13 — End: ?
  Filled 2017-02-13: qty 2

## 2017-02-13 MED ORDER — ALBUMIN HUMAN 5 % IV SOLN
INTRAVENOUS | Status: DC | PRN
  Administered 2017-02-13 (×2): via INTRAVENOUS

## 2017-02-13 MED ORDER — LACTATED RINGERS IV SOLN
INTRAVENOUS | Status: DC | PRN
  Administered 2017-02-13 (×2): via INTRAVENOUS

## 2017-02-13 MED ORDER — MORPHINE SULFATE (PF) 4 MG/ML IV SOLN
1.0000 mg | INTRAVENOUS | Status: AC | PRN
  Administered 2017-02-14: 1 mg via INTRAVENOUS
  Administered 2017-02-14: 2 mg via INTRAVENOUS
  Administered 2017-02-14: 1 mg via INTRAVENOUS

## 2017-02-13 MED ORDER — BISACODYL 5 MG PO TBEC
10.0000 mg | DELAYED_RELEASE_TABLET | Freq: Every day | ORAL | Status: DC
  Administered 2017-02-14 – 2017-02-17 (×4): 10 mg via ORAL
  Filled 2017-02-13 (×4): qty 2

## 2017-02-13 MED ORDER — NITROGLYCERIN IN D5W 200-5 MCG/ML-% IV SOLN
2.0000 ug/min | INTRAVENOUS | Status: AC
  Administered 2017-02-13: 10 ug/min via INTRAVENOUS

## 2017-02-13 MED ORDER — DEXTROSE 5 % IV SOLN
1.5000 g | INTRAVENOUS | Status: AC
  Administered 2017-02-13: 1.5 g via INTRAVENOUS
  Administered 2017-02-13: .75 g via INTRAVENOUS
  Filled 2017-02-13: qty 1.5

## 2017-02-13 MED ORDER — INSULIN REGULAR HUMAN 100 UNIT/ML IJ SOLN
INTRAMUSCULAR | Status: AC
  Administered 2017-02-13: .8 [IU]/h via INTRAVENOUS
  Filled 2017-02-13: qty 1

## 2017-02-13 MED ORDER — SODIUM CHLORIDE 0.45 % IV SOLN: INTRAVENOUS | Status: DC | PRN

## 2017-02-13 MED ORDER — ORAL CARE MOUTH RINSE: 15.0000 mL | Freq: Four times a day (QID) | OROMUCOSAL | Status: DC

## 2017-02-13 MED ORDER — SODIUM CHLORIDE 0.9% FLUSH: 3.0000 mL | INTRAVENOUS | Status: DC | PRN

## 2017-02-13 MED ORDER — ALBUMIN HUMAN 5 % IV SOLN
250.0000 mL | INTRAVENOUS | Status: AC | PRN
  Administered 2017-02-13 (×3): 250 mL via INTRAVENOUS
  Filled 2017-02-13 (×2): qty 250

## 2017-02-13 MED ORDER — SODIUM CHLORIDE 0.9 % IV SOLN
1.5000 mg/kg/h | INTRAVENOUS | Status: AC
  Administered 2017-02-13: 1.5 mg/kg/h via INTRAVENOUS
  Filled 2017-02-13: qty 25

## 2017-02-13 MED ORDER — SODIUM CHLORIDE 0.9 % IV SOLN
0.0000 ug/min | INTRAVENOUS | Status: DC
  Administered 2017-02-14: 50 ug/min via INTRAVENOUS
  Filled 2017-02-13: qty 2

## 2017-02-13 MED ORDER — FAMOTIDINE IN NACL 20-0.9 MG/50ML-% IV SOLN
20.0000 mg | Freq: Two times a day (BID) | INTRAVENOUS | Status: AC
  Administered 2017-02-13 – 2017-02-14 (×2): 20 mg via INTRAVENOUS
  Filled 2017-02-13 (×2): qty 50

## 2017-02-13 MED ORDER — SODIUM CHLORIDE 0.9 % IV SOLN
INTRAVENOUS | Status: DC
  Filled 2017-02-13: qty 30

## 2017-02-13 MED ORDER — LACTATED RINGERS IV SOLN: INTRAVENOUS | Status: DC

## 2017-02-13 MED ORDER — METOPROLOL TARTRATE 5 MG/5ML IV SOLN: 2.5000 mg | INTRAVENOUS | Status: DC | PRN

## 2017-02-13 MED ORDER — LACTATED RINGERS IV SOLN
INTRAVENOUS | Status: DC
  Administered 2017-02-13: via INTRAVENOUS

## 2017-02-13 MED ORDER — SODIUM CHLORIDE 0.9 % IJ SOLN
OROMUCOSAL | Status: DC | PRN
  Administered 2017-02-13 (×3): 4 mL via TOPICAL

## 2017-02-13 MED ORDER — SODIUM CHLORIDE 0.9 % IV SOLN
30.0000 ug/min | INTRAVENOUS | Status: AC
  Administered 2017-02-13: 15 ug/min via INTRAVENOUS
  Filled 2017-02-13: qty 2

## 2017-02-13 MED ORDER — ASPIRIN 81 MG PO CHEW
324.0000 mg | CHEWABLE_TABLET | Freq: Every day | ORAL | Status: DC
  Filled 2017-02-13: qty 4

## 2017-02-13 MED ORDER — DEXTROSE 5 % IV SOLN
750.0000 mg | INTRAVENOUS | Status: DC
  Filled 2017-02-13: qty 750

## 2017-02-13 MED ORDER — DOCUSATE SODIUM 100 MG PO CAPS
200.0000 mg | ORAL_CAPSULE | Freq: Every day | ORAL | Status: DC
  Administered 2017-02-14 – 2017-02-17 (×4): 200 mg via ORAL
  Filled 2017-02-13 (×4): qty 2

## 2017-02-13 MED ORDER — DEXTROSE 5 % IV SOLN
0.0000 ug/min | INTRAVENOUS | Status: DC
  Filled 2017-02-13: qty 4

## 2017-02-13 MED ORDER — LACTATED RINGERS IV SOLN: 500.0000 mL | Freq: Once | INTRAVENOUS | Status: DC | PRN

## 2017-02-13 MED ORDER — FENTANYL CITRATE (PF) 250 MCG/5ML IJ SOLN
INTRAMUSCULAR | Status: DC | PRN
  Administered 2017-02-13: 100 ug via INTRAVENOUS
  Administered 2017-02-13: 650 ug via INTRAVENOUS
  Administered 2017-02-13: 50 ug via INTRAVENOUS
  Administered 2017-02-13: 150 ug via INTRAVENOUS
  Administered 2017-02-13 (×2): 200 ug via INTRAVENOUS
  Administered 2017-02-13: 250 ug via INTRAVENOUS

## 2017-02-13 MED ORDER — MIDAZOLAM HCL 2 MG/2ML IJ SOLN: 2.0000 mg | INTRAMUSCULAR | Status: DC | PRN

## 2017-02-13 MED ORDER — DEXTROSE 5 % IV SOLN
1.5000 g | Freq: Two times a day (BID) | INTRAVENOUS | Status: AC
  Administered 2017-02-14 – 2017-02-15 (×4): 1.5 g via INTRAVENOUS
  Filled 2017-02-13 (×4): qty 1.5

## 2017-02-13 MED ORDER — ACETAMINOPHEN 500 MG PO TABS
1000.0000 mg | ORAL_TABLET | Freq: Four times a day (QID) | ORAL | Status: DC
  Administered 2017-02-13 – 2017-02-17 (×13): 1000 mg via ORAL
  Filled 2017-02-13 (×14): qty 2

## 2017-02-13 MED ORDER — OXYCODONE HCL 5 MG PO TABS
5.0000 mg | ORAL_TABLET | ORAL | Status: DC | PRN
  Administered 2017-02-14: 5 mg via ORAL
  Administered 2017-02-15 (×3): 10 mg via ORAL
  Filled 2017-02-13 (×4): qty 2

## 2017-02-13 MED ORDER — PROTAMINE SULFATE 10 MG/ML IV SOLN
INTRAVENOUS | Status: DC | PRN
  Administered 2017-02-13 (×3): 50 mg via INTRAVENOUS
  Administered 2017-02-13: 40 mg via INTRAVENOUS
  Administered 2017-02-13: 50 mg via INTRAVENOUS
  Administered 2017-02-13: 10 mg via INTRAVENOUS

## 2017-02-13 MED ORDER — TRANEXAMIC ACID (OHS) PUMP PRIME SOLUTION
2.0000 mg/kg | INTRAVENOUS | Status: DC
  Filled 2017-02-13: qty 1.57

## 2017-02-13 MED ORDER — SODIUM CHLORIDE 0.9% FLUSH: 10.0000 mL | INTRAVENOUS | Status: DC | PRN

## 2017-02-13 MED ORDER — INSULIN ASPART 100 UNIT/ML ~~LOC~~ SOLN
0.0000 [IU] | SUBCUTANEOUS | Status: DC
  Administered 2017-02-14: 2 [IU] via SUBCUTANEOUS

## 2017-02-13 MED ORDER — VANCOMYCIN HCL IN DEXTROSE 1-5 GM/200ML-% IV SOLN
1000.0000 mg | Freq: Once | INTRAVENOUS | Status: AC
  Administered 2017-02-13: 1000 mg via INTRAVENOUS
  Filled 2017-02-13: qty 200

## 2017-02-13 MED ORDER — THROMBIN 20000 UNITS EX SOLR
CUTANEOUS | Status: AC
  Filled 2017-02-13: qty 20000

## 2017-02-13 MED ORDER — METOPROLOL TARTRATE 25 MG/10 ML ORAL SUSPENSION: 12.5000 mg | Freq: Two times a day (BID) | ORAL | Status: DC

## 2017-02-13 MED ORDER — HEPARIN (PORCINE) IN NACL 100-0.45 UNIT/ML-% IJ SOLN: 1000.0000 [IU]/h | INTRAMUSCULAR | Status: DC

## 2017-02-13 MED ORDER — MAGNESIUM SULFATE 4 GM/100ML IV SOLN
4.0000 g | Freq: Once | INTRAVENOUS | Status: AC
  Administered 2017-02-13: 4 g via INTRAVENOUS

## 2017-02-13 MED ORDER — TRAMADOL HCL 50 MG PO TABS
50.0000 mg | ORAL_TABLET | ORAL | Status: DC | PRN
  Administered 2017-02-14 – 2017-02-15 (×4): 50 mg via ORAL
  Filled 2017-02-13 (×4): qty 1

## 2017-02-13 MED ORDER — PANTOPRAZOLE SODIUM 40 MG PO TBEC
40.0000 mg | DELAYED_RELEASE_TABLET | Freq: Every day | ORAL | Status: DC
  Administered 2017-02-15 – 2017-02-17 (×3): 40 mg via ORAL
  Filled 2017-02-13 (×3): qty 1

## 2017-02-13 MED ORDER — FENTANYL CITRATE (PF) 250 MCG/5ML IJ SOLN
INTRAMUSCULAR | Status: AC
Start: 2017-02-13 — End: ?
  Filled 2017-02-13: qty 5

## 2017-02-13 MED ORDER — NITROGLYCERIN IN D5W 200-5 MCG/ML-% IV SOLN: 0.0000 ug/min | INTRAVENOUS | Status: DC

## 2017-02-13 MED ORDER — ARTIFICIAL TEARS OPHTHALMIC OINT
TOPICAL_OINTMENT | OPHTHALMIC | Status: DC | PRN
  Administered 2017-02-13: 1 via OPHTHALMIC

## 2017-02-13 MED ORDER — ACETAMINOPHEN 160 MG/5ML PO SOLN: 1000.0000 mg | Freq: Four times a day (QID) | ORAL | Status: DC

## 2017-02-13 MED ORDER — ORAL CARE MOUTH RINSE
15.0000 mL | Freq: Two times a day (BID) | OROMUCOSAL | Status: DC
  Administered 2017-02-13 – 2017-02-15 (×3): 15 mL via OROMUCOSAL

## 2017-02-13 MED ORDER — MAGNESIUM SULFATE 4 GM/100ML IV SOLN
INTRAVENOUS | Status: AC
  Filled 2017-02-13: qty 100

## 2017-02-13 MED ORDER — SODIUM CHLORIDE 0.9 % IV SOLN: INTRAVENOUS | Status: DC

## 2017-02-13 MED ORDER — MIDAZOLAM HCL 5 MG/5ML IJ SOLN
INTRAMUSCULAR | Status: DC | PRN
  Administered 2017-02-13: 2 mg via INTRAVENOUS
  Administered 2017-02-13: 4 mg via INTRAVENOUS
  Administered 2017-02-13: 1 mg via INTRAVENOUS

## 2017-02-13 MED ORDER — METOPROLOL TARTRATE 12.5 MG HALF TABLET
12.5000 mg | ORAL_TABLET | Freq: Two times a day (BID) | ORAL | Status: DC
  Administered 2017-02-16 – 2017-02-17 (×2): 12.5 mg via ORAL
  Filled 2017-02-13 (×4): qty 1

## 2017-02-13 MED ORDER — SODIUM CHLORIDE 0.9% FLUSH
10.0000 mL | Freq: Two times a day (BID) | INTRAVENOUS | Status: DC
  Administered 2017-02-14 – 2017-02-15 (×2): 10 mL

## 2017-02-13 MED ORDER — INSULIN REGULAR HUMAN 100 UNIT/ML IJ SOLN: INTRAMUSCULAR | Status: DC

## 2017-02-13 MED ORDER — SODIUM CHLORIDE 0.9% FLUSH
3.0000 mL | Freq: Two times a day (BID) | INTRAVENOUS | Status: DC
  Administered 2017-02-14 – 2017-02-17 (×3): 3 mL via INTRAVENOUS

## 2017-02-13 MED ORDER — PAPAVERINE HCL 30 MG/ML IJ SOLN
INTRAMUSCULAR | Status: DC | PRN
  Administered 2017-02-13: 500 mL via INTRAVASCULAR

## 2017-02-13 MED ORDER — DEXMEDETOMIDINE HCL IN NACL 400 MCG/100ML IV SOLN
0.1000 ug/kg/h | INTRAVENOUS | Status: AC
  Administered 2017-02-13: 0.7 ug/kg/h via INTRAVENOUS
  Filled 2017-02-13: qty 100

## 2017-02-13 MED ORDER — HEPARIN SODIUM (PORCINE) 1000 UNIT/ML IJ SOLN
INTRAMUSCULAR | Status: DC | PRN
  Administered 2017-02-13: 25000 [IU] via INTRAVENOUS

## 2017-02-13 MED ORDER — PROPOFOL 10 MG/ML IV BOLUS
INTRAVENOUS | Status: DC | PRN
  Administered 2017-02-13: 20 mg via INTRAVENOUS

## 2017-02-13 MED ORDER — ACETAMINOPHEN 160 MG/5ML PO SOLN: 650.0000 mg | Freq: Once | ORAL | Status: AC

## 2017-02-13 MED ORDER — POTASSIUM CHLORIDE 10 MEQ/50ML IV SOLN
10.0000 meq | INTRAVENOUS | Status: AC
  Administered 2017-02-13 (×3): 10 meq via INTRAVENOUS

## 2017-02-13 MED ORDER — VANCOMYCIN HCL 10 G IV SOLR
1250.0000 mg | INTRAVENOUS | Status: AC
  Administered 2017-02-13: 1250 mg via INTRAVENOUS
  Filled 2017-02-13: qty 1250

## 2017-02-13 MED ORDER — 0.9 % SODIUM CHLORIDE (POUR BTL) OPTIME
TOPICAL | Status: DC | PRN
  Administered 2017-02-13: 6000 mL

## 2017-02-13 MED ORDER — MORPHINE SULFATE (PF) 4 MG/ML IV SOLN
2.0000 mg | INTRAVENOUS | Status: DC | PRN
  Administered 2017-02-14 (×2): 4 mg via INTRAVENOUS
  Administered 2017-02-15: 2 mg via INTRAVENOUS
  Filled 2017-02-13 (×5): qty 1

## 2017-02-13 MED ORDER — DOPAMINE-DEXTROSE 3.2-5 MG/ML-% IV SOLN: 0.0000 ug/kg/min | INTRAVENOUS | Status: DC

## 2017-02-13 MED ORDER — SODIUM CHLORIDE 0.9 % IV SOLN: 250.0000 mL | INTRAVENOUS | Status: DC

## 2017-02-13 MED ORDER — ONDANSETRON HCL 4 MG/2ML IJ SOLN: 4.0000 mg | Freq: Four times a day (QID) | INTRAMUSCULAR | Status: DC | PRN

## 2017-02-13 MED ORDER — CHLORHEXIDINE GLUCONATE 0.12 % MT SOLN
15.0000 mL | OROMUCOSAL | Status: AC
  Administered 2017-02-13: 15 mL via OROMUCOSAL

## 2017-02-13 MED ORDER — ATORVASTATIN CALCIUM 80 MG PO TABS
80.0000 mg | ORAL_TABLET | Freq: Every day | ORAL | Status: DC
  Administered 2017-02-14 – 2017-02-17 (×4): 80 mg via ORAL
  Filled 2017-02-13 (×4): qty 1

## 2017-02-13 MED ORDER — HEMOSTATIC AGENTS (NO CHARGE) OPTIME
TOPICAL | Status: DC | PRN
  Administered 2017-02-13: 1 via TOPICAL

## 2017-02-13 MED ORDER — INSULIN REGULAR BOLUS VIA INFUSION
0.0000 [IU] | Freq: Three times a day (TID) | INTRAVENOUS | Status: DC
  Filled 2017-02-13: qty 10

## 2017-02-13 MED ORDER — CHLORHEXIDINE GLUCONATE CLOTH 2 % EX PADS
6.0000 | MEDICATED_PAD | Freq: Every day | CUTANEOUS | Status: DC
  Administered 2017-02-13 – 2017-02-15 (×3): 6 via TOPICAL

## 2017-02-13 MED ORDER — TRANEXAMIC ACID (OHS) BOLUS VIA INFUSION
15.0000 mg/kg | INTRAVENOUS | Status: AC
  Administered 2017-02-13: 1174.5 mg via INTRAVENOUS
  Filled 2017-02-13: qty 1175

## 2017-02-13 MED ORDER — ASPIRIN EC 325 MG PO TBEC
325.0000 mg | DELAYED_RELEASE_TABLET | Freq: Every day | ORAL | Status: DC
  Administered 2017-02-14 – 2017-02-17 (×4): 325 mg via ORAL
  Filled 2017-02-13 (×4): qty 1

## 2017-02-13 MED ORDER — ROCURONIUM BROMIDE 10 MG/ML (PF) SYRINGE
PREFILLED_SYRINGE | INTRAVENOUS | Status: DC | PRN
  Administered 2017-02-13 (×2): 50 mg via INTRAVENOUS

## 2017-02-13 MED ORDER — BISACODYL 10 MG RE SUPP
10.0000 mg | Freq: Every day | RECTAL | Status: DC
  Administered 2017-02-16: 10 mg via RECTAL
  Filled 2017-02-13: qty 1

## 2017-02-13 MED ORDER — THROMBIN 20000 UNITS EX SOLR
CUTANEOUS | Status: DC | PRN
  Administered 2017-02-13: 20000 [IU] via TOPICAL

## 2017-02-13 MED FILL — Magnesium Sulfate Inj 50%: INTRAMUSCULAR | Qty: 10 | Status: AC

## 2017-02-13 MED FILL — Potassium Chloride Inj 2 mEq/ML: INTRAVENOUS | Qty: 40 | Status: AC

## 2017-02-13 MED FILL — Heparin Sodium (Porcine) Inj 1000 Unit/ML: INTRAMUSCULAR | Qty: 30 | Status: AC

## 2017-02-13 SURGICAL SUPPLY — 114 items
BAG DECANTER FOR FLEXI CONT (MISCELLANEOUS) ×4 IMPLANT
BANDAGE ACE 4X5 VEL STRL LF (GAUZE/BANDAGES/DRESSINGS) ×4 IMPLANT
BANDAGE ACE 6X5 VEL STRL LF (GAUZE/BANDAGES/DRESSINGS) ×4 IMPLANT
BASKET HEART  (ORDER IN 25'S) (MISCELLANEOUS) ×1
BASKET HEART (ORDER IN 25'S) (MISCELLANEOUS) ×1
BASKET HEART (ORDER IN 25S) (MISCELLANEOUS) ×2 IMPLANT
BLADE STERNUM SYSTEM 6 (BLADE) ×4 IMPLANT
BLADE SURG 11 STRL SS (BLADE) ×4 IMPLANT
BLADE SURG ROTATE 9660 (MISCELLANEOUS) ×4 IMPLANT
BNDG GAUZE ELAST 4 BULKY (GAUZE/BANDAGES/DRESSINGS) ×4 IMPLANT
CANISTER SUCT 3000ML PPV (MISCELLANEOUS) ×4 IMPLANT
CATH ROBINSON RED A/P 18FR (CATHETERS) ×8 IMPLANT
CATH THORACIC 28FR (CATHETERS) ×4 IMPLANT
CATH THORACIC 36FR (CATHETERS) ×4 IMPLANT
CATH THORACIC 36FR RT ANG (CATHETERS) ×4 IMPLANT
CLIP VESOCCLUDE MED 24/CT (CLIP) IMPLANT
CLIP VESOCCLUDE SM WIDE 24/CT (CLIP) ×4 IMPLANT
CRADLE DONUT ADULT HEAD (MISCELLANEOUS) ×4 IMPLANT
DERMABOND ADHESIVE PROPEN (GAUZE/BANDAGES/DRESSINGS) ×2
DERMABOND ADVANCED .7 DNX6 (GAUZE/BANDAGES/DRESSINGS) ×2 IMPLANT
DRAPE CARDIOVASCULAR INCISE (DRAPES) ×2
DRAPE SLUSH/WARMER DISC (DRAPES) ×4 IMPLANT
DRAPE SRG 135X102X78XABS (DRAPES) ×2 IMPLANT
DRSG COVADERM 4X14 (GAUZE/BANDAGES/DRESSINGS) ×4 IMPLANT
ELECT CAUTERY BLADE 6.4 (BLADE) ×4 IMPLANT
ELECT REM PT RETURN 9FT ADLT (ELECTROSURGICAL) ×8
ELECTRODE REM PT RTRN 9FT ADLT (ELECTROSURGICAL) ×4 IMPLANT
FELT TEFLON 1X6 (MISCELLANEOUS) ×8 IMPLANT
GAUZE SPONGE 4X4 12PLY STRL (GAUZE/BANDAGES/DRESSINGS) ×8 IMPLANT
GLOVE BIO SURGEON STRL SZ 6 (GLOVE) ×4 IMPLANT
GLOVE BIO SURGEON STRL SZ 6.5 (GLOVE) ×18 IMPLANT
GLOVE BIO SURGEON STRL SZ7 (GLOVE) IMPLANT
GLOVE BIO SURGEON STRL SZ7.5 (GLOVE) IMPLANT
GLOVE BIO SURGEONS STRL SZ 6.5 (GLOVE) ×6
GLOVE BIOGEL PI IND STRL 6 (GLOVE) ×4 IMPLANT
GLOVE BIOGEL PI IND STRL 6.5 (GLOVE) IMPLANT
GLOVE BIOGEL PI IND STRL 7.0 (GLOVE) IMPLANT
GLOVE BIOGEL PI INDICATOR 6 (GLOVE) ×4
GLOVE BIOGEL PI INDICATOR 6.5 (GLOVE)
GLOVE BIOGEL PI INDICATOR 7.0 (GLOVE)
GLOVE EUDERMIC 7 POWDERFREE (GLOVE) ×8 IMPLANT
GLOVE ORTHO TXT STRL SZ7.5 (GLOVE) IMPLANT
GOWN STRL REUS W/ TWL LRG LVL3 (GOWN DISPOSABLE) ×16 IMPLANT
GOWN STRL REUS W/ TWL XL LVL3 (GOWN DISPOSABLE) ×2 IMPLANT
GOWN STRL REUS W/TWL LRG LVL3 (GOWN DISPOSABLE) ×16
GOWN STRL REUS W/TWL XL LVL3 (GOWN DISPOSABLE) ×2
HEMOSTAT POWDER SURGIFOAM 1G (HEMOSTASIS) ×12 IMPLANT
HEMOSTAT SURGICEL 2X14 (HEMOSTASIS) ×4 IMPLANT
INSERT FOGARTY 61MM (MISCELLANEOUS) IMPLANT
INSERT FOGARTY XLG (MISCELLANEOUS) IMPLANT
KIT BASIN OR (CUSTOM PROCEDURE TRAY) ×4 IMPLANT
KIT CATH CPB BARTLE (MISCELLANEOUS) ×4 IMPLANT
KIT ROOM TURNOVER OR (KITS) ×4 IMPLANT
KIT SUCTION CATH 14FR (SUCTIONS) ×4 IMPLANT
KIT VASOVIEW HEMOPRO VH 3000 (KITS) ×4 IMPLANT
NS IRRIG 1000ML POUR BTL (IV SOLUTION) ×24 IMPLANT
PACK OPEN HEART (CUSTOM PROCEDURE TRAY) ×4 IMPLANT
PAD ARMBOARD 7.5X6 YLW CONV (MISCELLANEOUS) ×8 IMPLANT
PAD ELECT DEFIB RADIOL ZOLL (MISCELLANEOUS) ×4 IMPLANT
PENCIL BUTTON HOLSTER BLD 10FT (ELECTRODE) ×4 IMPLANT
PUNCH AORTIC ROTATE 4.0MM (MISCELLANEOUS) IMPLANT
PUNCH AORTIC ROTATE 4.5MM 8IN (MISCELLANEOUS) ×4 IMPLANT
PUNCH AORTIC ROTATE 5MM 8IN (MISCELLANEOUS) IMPLANT
SET CARDIOPLEGIA MPS 5001102 (MISCELLANEOUS) ×4 IMPLANT
SPONGE INTESTINAL PEANUT (DISPOSABLE) IMPLANT
SPONGE LAP 18X18 X RAY DECT (DISPOSABLE) ×4 IMPLANT
SPONGE LAP 4X18 X RAY DECT (DISPOSABLE) ×4 IMPLANT
SUT BONE WAX W31G (SUTURE) ×4 IMPLANT
SUT ETHIBOND 2 0 SH (SUTURE) ×8
SUT ETHIBOND 2 0 SH 36X2 (SUTURE) ×8 IMPLANT
SUT MNCRL AB 4-0 PS2 18 (SUTURE) ×4 IMPLANT
SUT PROLENE 3 0 SH DA (SUTURE) IMPLANT
SUT PROLENE 3 0 SH1 36 (SUTURE) ×4 IMPLANT
SUT PROLENE 4 0 RB 1 (SUTURE) ×2
SUT PROLENE 4 0 SH DA (SUTURE) IMPLANT
SUT PROLENE 4-0 RB1 .5 CRCL 36 (SUTURE) ×2 IMPLANT
SUT PROLENE 5 0 C 1 36 (SUTURE) ×8 IMPLANT
SUT PROLENE 6 0 C 1 30 (SUTURE) ×8 IMPLANT
SUT PROLENE 7 0 BV 1 (SUTURE) IMPLANT
SUT PROLENE 7 0 BV1 MDA (SUTURE) ×4 IMPLANT
SUT PROLENE 8 0 BV175 6 (SUTURE) ×16 IMPLANT
SUT SILK  1 MH (SUTURE) ×6
SUT SILK 1 MH (SUTURE) ×6 IMPLANT
SUT SILK 1 TIES 10X30 (SUTURE) ×4 IMPLANT
SUT SILK 2 0 SH CR/8 (SUTURE) ×8 IMPLANT
SUT SILK 2 0 TIES 10X30 (SUTURE) ×4 IMPLANT
SUT SILK 2 0 TIES 17X18 (SUTURE) ×2
SUT SILK 2-0 18XBRD TIE BLK (SUTURE) ×2 IMPLANT
SUT SILK 3 0 SH CR/8 (SUTURE) ×4 IMPLANT
SUT SILK 4 0 TIE 10X30 (SUTURE) ×8 IMPLANT
SUT STEEL STERNAL CCS#1 18IN (SUTURE) IMPLANT
SUT STEEL SZ 6 DBL 3X14 BALL (SUTURE) IMPLANT
SUT TEM PAC WIRE 2 0 SH (SUTURE) ×16 IMPLANT
SUT VIC AB 1 CTX 36 (SUTURE) ×4
SUT VIC AB 1 CTX36XBRD ANBCTR (SUTURE) ×4 IMPLANT
SUT VIC AB 2-0 CT1 27 (SUTURE) ×2
SUT VIC AB 2-0 CT1 TAPERPNT 27 (SUTURE) ×2 IMPLANT
SUT VIC AB 2-0 CTX 27 (SUTURE) ×8 IMPLANT
SUT VIC AB 3-0 SH 27 (SUTURE)
SUT VIC AB 3-0 SH 27X BRD (SUTURE) IMPLANT
SUT VIC AB 3-0 X1 27 (SUTURE) ×8 IMPLANT
SUT VICRYL 4-0 PS2 18IN ABS (SUTURE) IMPLANT
SUTURE E-PAK OPEN HEART (SUTURE) ×4 IMPLANT
SYSTEM SAHARA CHEST DRAIN ATS (WOUND CARE) ×4 IMPLANT
TAPE CLOTH SURG 4X10 WHT LF (GAUZE/BANDAGES/DRESSINGS) ×4 IMPLANT
TAPE PAPER 2X10 WHT MICROPORE (GAUZE/BANDAGES/DRESSINGS) ×4 IMPLANT
TOWEL GREEN STERILE (TOWEL DISPOSABLE) ×8 IMPLANT
TOWEL GREEN STERILE FF (TOWEL DISPOSABLE) ×8 IMPLANT
TOWEL OR 17X24 6PK STRL BLUE (TOWEL DISPOSABLE) ×4 IMPLANT
TOWEL OR 17X26 10 PK STRL BLUE (TOWEL DISPOSABLE) ×4 IMPLANT
TRAY FOLEY SILVER 16FR TEMP (SET/KITS/TRAYS/PACK) ×4 IMPLANT
TUBING INSUFFLATION (TUBING) ×4 IMPLANT
UNDERPAD 30X30 (UNDERPADS AND DIAPERS) ×4 IMPLANT
WATER STERILE IRR 1000ML POUR (IV SOLUTION) ×8 IMPLANT

## 2017-02-13 NOTE — Transfer of Care (Signed)
Immediate Anesthesia Transfer of Care Note  Patient: Richard Rivas  Procedure(s) Performed: Procedure(s): CORONARY ARTERY BYPASS GRAFTING (CABG) x3 (LIMA to LAD, SVG to CIRCUMFLEX , SVG to PDA )  , using left internal mammary artery and right leg greater saphenous vein harvested endoscopically (N/A) TRANSESOPHAGEAL ECHOCARDIOGRAM (TEE) (N/A)  Patient Location: SICU  Anesthesia Type:General  Level of Consciousness: sedated and Patient remains intubated per anesthesia plan  Airway & Oxygen Therapy: Patient remains intubated per anesthesia plan and Patient placed on Ventilator (see vital sign flow sheet for setting)  Post-op Assessment: Report given to RN and Post -op Vital signs reviewed and stable  Post vital signs: Reviewed and stable  Last Vitals:  Vitals:   02/13/17 1100 02/13/17 1200  BP: 129/68 128/78  Pulse: (!) 53 (!) 54  Resp: 14 15  Temp:    SpO2: 95% 99%    Last Pain:  Vitals:   02/13/17 1105  TempSrc:   PainSc: 0-No pain         Complications: No apparent anesthesia complications

## 2017-02-13 NOTE — Anesthesia Procedure Notes (Signed)
Central Venous Catheter Insertion Performed by: JACKSON, CARSWELL, anesthesiologist Start/End9/14/2018 1:01 PM, 02/13/2017 1:14 PM Preanesthetic checklist: patient identified, IV checked, risks and benefits discussed, surgical consent, monitors and equipment checked, pre-op evaluation, timeout performed and anesthesia consent Position: supine Lidocaine 1% used for infiltration and patient sedated Hand hygiene performed , maximum sterile barriers used  and Seldinger technique used Catheter size: 8.5 Fr PA cath was placed.Sheath introducer Swan type:thermodilution Procedure performed using ultrasound guided technique. Ultrasound Notes:anatomy identified, needle tip was noted to be adjacent to the nerve/plexus identified, no ultrasound evidence of intravascular and/or intraneural injection and image(s) printed for medical record Attempts: 1 Following insertion, line sutured, dressing applied and Biopatch. Post procedure assessment: blood return through all ports, free fluid flow and no air  Patient tolerated the procedure well with no immediate complications. Additional procedure comments: PA catheter:  Routine monitors. Timeout, sterile prep, drape, FBP R neck.  Trendelenburg position.  1% Lido local, finder and trocar RIJ 1st pass with US guidance.  Cordis placed over J wire. PA catheter in easily.  BioPatch and Sterile dressing applied.  Patient tolerated well, VSS.  C Jackson, MD.           

## 2017-02-13 NOTE — Progress Notes (Addendum)
Dr. Cyndia Bent paged regarding admission orders. Call back received from OR. Verbal received to keep pt NPO & continue Heparin infusion until on call to OR. No other orders received at this time. Pt scheduled for CABG @ 1300. Will continue to monitor.

## 2017-02-13 NOTE — Consult Note (Signed)
   [facility] Inpatient Consult   02/13/2017  Richard Rivas 07/24/1940 [id number]   Patient assessed as showing as a re-admission in the [employer] plan.  Patient was transferred from [facility] for a CABG.  Will follow up for progression and needs. For questions, please contact:  Natividad Brood, RN BSN [facility] Liaison  [phone] business mobile phone Toll free office [phone]

## 2017-02-13 NOTE — Anesthesia Preprocedure Evaluation (Addendum)
Anesthesia Evaluation  Patient identified by MRN, date of birth, ID band Patient awake    Reviewed: Allergy & Precautions, NPO status , Patient's Chart, lab work & pertinent test results  History of Anesthesia Complications Negative for: history of anesthetic complications  Airway Mallampati: II  TM Distance: >3 FB Neck ROM: Full    Dental  (+) Edentulous Upper, Edentulous Lower   Pulmonary COPD, former smoker (quit 1996),    breath sounds clear to auscultation       Cardiovascular hypertension (has not started his beta blockade yet), Pt. on medications + CAD and + Past MI   Rhythm:Regular Rate:Normal  Cath: normal left ventricular function Severe distal left main disease 99% Severe ostial circumflex disease 99% Moderate LAD disease Moderate RCA disease   Neuro/Psych Depression negative neurological ROS     GI/Hepatic Neg liver ROS, GERD  Controlled,  Endo/Other  negative endocrine ROS  Renal/GU negative Renal ROS     Musculoskeletal   Abdominal   Peds  Hematology negative hematology ROS (+)   Anesthesia Other Findings   Reproductive/Obstetrics                            Anesthesia Physical Anesthesia Plan  ASA: III  Anesthesia Plan: General   Post-op Pain Management:    Induction: Intravenous  PONV Risk Score and Plan: Treatment may vary due to age or medical condition  Airway Management Planned: Oral ETT  Additional Equipment: Arterial line, PA Cath, TEE and Ultrasound Guidance Line Placement  Intra-op Plan:   Post-operative Plan: Post-operative intubation/ventilation  Informed Consent: I have reviewed the patients History and Physical, chart, labs and discussed the procedure including the risks, benefits and alternatives for the proposed anesthesia with the patient or authorized representative who has indicated his/her understanding and acceptance.     Plan Discussed  with: CRNA and Surgeon  Anesthesia Plan Comments: (Plan routine monitors, A line, PA catheter, GETA with TEE and post op ventilation)       Anesthesia Quick Evaluation

## 2017-02-13 NOTE — Progress Notes (Signed)
CT surgery p.m. Rounds Status post CABG 3 this afternoon Remains sedated on ventilator Hemodynamic stable Minimal chest tube drainage

## 2017-02-13 NOTE — Anesthesia Procedure Notes (Signed)
Arterial Line Insertion Start/End9/14/2018 1:11 PM, 02/13/2017 1:11 PM Performed by: Filbert Berthold, CRNA  Preanesthetic checklist: patient identified, IV checked, site marked, risks and benefits discussed, surgical consent, monitors and equipment checked, pre-op evaluation and timeout performed Lidocaine 1% used for infiltration and patient sedated radial was placed Catheter size: 20 G  Attempts: 1 Procedure performed without using ultrasound guided technique. Following insertion, dressing applied and Biopatch. Post procedure assessment: normal  Patient tolerated the procedure well with no immediate complications.

## 2017-02-13 NOTE — Care Management Note (Signed)
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-- plan for CABG today                 Action/Plan: PTA pt lived at home with spouse-  CM to follow post op for d/c needs  Expected Discharge Date:                  Expected Discharge Plan:     In-House Referral:  [facility]  Discharge planning Services  CM Consult  Post Acute Care Choice:    Choice offered to:     DME Arranged:    DME Agency:     HH Arranged:    [place] Agency:     Status of Service:  In process, will continue to follow  If discussed at Long Length of Stay Meetings, dates discussed:    Discharge Disposition:   Additional Comments:  Dawayne Patricia, RN 02/13/2017, 11:49 AM

## 2017-02-13 NOTE — Progress Notes (Signed)
Procedure(s) (LRB): CORONARY ARTERY BYPASS GRAFTING (CABG) (N/A) TRANSESOPHAGEAL ECHOCARDIOGRAM (TEE) (N/A) Subjective: No chest pain or shortness of breath overnight  Objective: Vital signs in last 24 hours: Temp:  [97.7 F (36.5 C)-98.2 F (36.8 C)] 98.2 F (36.8 C) (09/14 0400) Pulse Rate:  [47-55] 51 (09/14 0700) Cardiac Rhythm: Sinus bradycardia (09/13 2000) Resp:  [12-27] 14 (09/14 0700) BP: (88-130)/(37-108) 108/37 (09/14 0700) SpO2:  [90 %-99 %] 96 % (09/14 0700) Weight:  [77.6 kg (171 lb)-78.3 kg (172 lb 9.9 oz)] 78.3 kg (172 lb 9.9 oz) (09/13 1852)  Hemodynamic parameters for last 24 hours:    Intake/Output from previous day: No intake/output data recorded. Intake/Output this shift: No intake/output data recorded.  General appearance: alert and cooperative Heart: regular rate and rhythm, S1, S2 normal, no murmur, click, rub or gallop Lungs: clear to auscultation bilaterally  Lab Results:  Recent Labs  02/11/17 2015 02/12/17 0527  WBC 9.8 7.2  HGB 14.4 13.5  HCT 43.3 40.5  PLT 201 192   BMET:  Recent Labs  02/11/17 2015 02/12/17 0527  NA 140 140  K 4.2 3.7  CL 108 108  CO2 25 27  GLUCOSE 169* 106*  BUN 20 19  CREATININE 1.24 1.06  CALCIUM 9.3 8.5*    PT/INR:  Recent Labs  02/12/17 0527  LABPROT 13.8  INR 1.07   ABG No results found for: PHART, HCO3, TCO2, ACIDBASEDEF, O2SAT CBG (last 3)  No results for input(s): GLUCAP in the last 72 hours.  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 Plan CABG today. I discussed the operative procedure with the patient and his wife this am including alternatives, benefits and risks; including but not limited to bleeding, blood transfusion, infection, stroke, myocardial infarction, graft failure, heart block requiring a permanent pacemaker, organ dysfunction, and death.  Richard Rivas understands and agrees to proceed.   LOS: 1 day    [name] 02/13/2017

## 2017-02-13 NOTE — Plan of Care (Signed)
Problem: Respiratory: Goal: Levels of oxygenation will improve Outcome: Progressing Extubated to 4L [place]; resting comfortably sats >93

## 2017-02-13 NOTE — Brief Op Note (Signed)
02/12/2017 - 02/13/2017  3:25 PM  PATIENT:  Richard Rivas  76 y.o. male  PRE-OPERATIVE DIAGNOSIS:  CAD (critical left main stenosis)  POST-OPERATIVE DIAGNOSIS:  CAD (critical left main stenosis)  PROCEDURE:  TRANSESOPHAGEAL ECHOCARDIOGRAM (TEE), URGENT MEDIAN STERNOTOMY for CORONARY ARTERY BYPASS GRAFTING (CABG) x 3 (LIMA to LAD, SVG to CIRCUMFLEX , SVG to PDA ) using left internal mammary artery and right leg greater saphenous vein harvested endoscopically    SURGEON:  Surgeon(s) and Role:    * [name], MD - Primary  PHYSICIAN ASSISTANT: Lars Pinks PA-C  ANESTHESIA:   general  EBL:  Total I/O In: 1000 [I.V.:1000] Out: 975 [Urine:975]  DRAINS: Chest tubes placed in the mediastinal and pleural spaces   COUNTS CORRECT:  YES  DICTATION: .Dragon Dictation  PLAN OF CARE: Admit to inpatient   PATIENT DISPOSITION:  ICU - intubated and hemodynamically stable.   Delay start of Pharmacological VTE agent (>24hrs) due to surgical blood loss or risk of bleeding: yes  BASELINE WEIGHT: 78.3 kg

## 2017-02-13 NOTE — Anesthesia Procedure Notes (Signed)
Procedure Name: Intubation Date/Time: 02/13/2017 1:50 PM Performed by: Oletta Lamas Pre-anesthesia Checklist: Patient identified, Emergency Drugs available, Suction available and Patient being monitored Patient Re-evaluated:Patient Re-evaluated prior to induction Oxygen Delivery Method: Circle System Utilized Preoxygenation: Pre-oxygenation with 100% oxygen Induction Type: IV induction Ventilation: Mask ventilation without difficulty Laryngoscope Size: Mac Grade View: Grade I Tube type: Oral Tube size: 8.0 mm Number of attempts: 1 Airway Equipment and Method: Stylet Placement Confirmation: ETT inserted through vocal cords under direct vision,  positive ETCO2 and breath sounds checked- equal and bilateral Secured at: 24 cm Tube secured with: Tape Dental Injury: Teeth and Oropharynx as per pre-operative assessment

## 2017-02-13 NOTE — Progress Notes (Signed)
Telephone report called and given to short stay RN, Joslyn Devon. All removable items off and given to family. Consent to be obtained in OR due to no order received prior to departure. This RN & OR tech transported pt to short stay unit with family present at bedside. Heparin gtt stopped on call prior to departure.

## 2017-02-13 NOTE — Procedures (Signed)
Extubation Procedure Note  Patient Details:   Name: Richard Rivas DOB: 08/27/1940 MRN: [id number]   Airway Documentation:     Evaluation  O2 sats: stable throughout Complications: No apparent complications Patient did tolerate procedure well. Bilateral Breath Sounds: Clear, Diminished   Yes   Patient was able to perform NIF -40 and FVC 800 mL and had a positive cuff leak.  Patient was extubated to a 4 L [place].  Patient performed IS of 750 mL.    Carrington Clamp A 02/13/2017, 11:19 PM

## 2017-02-13 NOTE — Progress Notes (Signed)
  Echocardiogram Echocardiogram Transesophageal has been performed.  Richard Rivas 02/13/2017, 2:15 PM

## 2017-02-13 NOTE — Op Note (Signed)
CARDIOVASCULAR SURGERY OPERATIVE NOTE  02/13/2017  Surgeon:  [name], MD  First Assistant: Lars Pinks, PA-C   Preoperative Diagnosis:  Severe left main and multi-vessel coronary artery disease   Postoperative Diagnosis:  Same   Procedure:  1. Median Sternotomy 2. Extracorporeal circulation 3.   Coronary artery bypass grafting x 3   Left internal mammary graft to the LAD  SVG to OM  SVG to PDA  4.   Endoscopic vein harvest from the right leg   Anesthesia:  General Endotracheal   Clinical History/Surgical Indication:  This 76 year old gentleman has high grade distal LM and ostial LCX stenosis with moderate LAD and RCA stenoses and presented with stuttering chest pain of several days duration and a small NSTEMI. I agree that urgent CABG is indicated in this patient. He has not had any further chest pain since presentation to [facility] and is on a heparin drip preop.   I discussed the operative procedure with the patient including alternatives, benefits and risks; including but not limited to bleeding, blood transfusion, infection, stroke, myocardial infarction, graft failure, heart block requiring a permanent pacemaker, organ dysfunction, and death.  Wallie Renshaw understands and agrees to proceed.    Preparation:  The patient was seen in the preoperative holding area and the correct patient, correct operation were confirmed with the patient after reviewing the medical record and catheterization. The consent was signed by me. Preoperative antibiotics were given. A pulmonary arterial line and radial arterial line were placed by the anesthesia team. The patient was taken back to the operating room and positioned supine on the operating room table. After being placed under general endotracheal anesthesia by the anesthesia team a foley catheter was placed. The neck, chest,  abdomen, and both legs were prepped with betadine soap and solution and draped in the usual sterile manner. A surgical time-out was taken and the correct patient and operative procedure were confirmed with the nursing and anesthesia staff.   Cardiopulmonary Bypass:  A median sternotomy was performed. The pericardium was opened in the midline. Right ventricular function appeared normal. The ascending aorta was of normal size and had no palpable plaque. There were no contraindications to aortic cannulation or cross-clamping. The patient was fully systemically heparinized and the ACT was maintained > 400 sec. The proximal aortic arch was cannulated with a 20 F aortic cannula for arterial inflow. Venous cannulation was performed via the right atrial appendage using a two-staged venous cannula. An antegrade cardioplegia/vent cannula was inserted into the mid-ascending aorta. Aortic occlusion was performed with a single cross-clamp. Systemic cooling to 32 degrees Centigrade and topical cooling of the heart with iced saline were used. Hyperkalemic antegrade cold blood cardioplegia was used to induce diastolic arrest and was then given at about 20 minute intervals throughout the period of arrest to maintain myocardial temperature at or below 10 degrees centigrade. A temperature probe was inserted into the interventricular septum and an insulating pad was placed in the pericardium.   Left internal mammary harvest:  The left side of the sternum was retracted using the Rultract retractor. The left internal mammary artery was harvested as a pedicle graft. All side branches were clipped. It was a medium-sized vessel of good quality with excellent blood flow. It was ligated distally and divided. It was sprayed with topical papaverine solution to prevent vasospasm.   Endoscopic vein harvest:  The right greater saphenous vein was harvested endoscopically through a 2 cm incision medial to the right knee.  It was  harvested from the upper thigh to below the knee. It was a medium-sized vein of good quality. The side branches were all ligated with 4-0 silk ties.    Coronary arteries:  The coronary arteries were examined.   LAD:  Large vessel, heavily diseased in the proximal and mid-portion  LCX:  Large OM with no distal disease. The distal branch of the LCX is relatively small where it can be seen.  RCA:  Diffusely diseased with calcific plaque extending almost to the origin of the PDA. The PDA and PL were free of disease.   Grafts:  1. LIMA to the LAD: 2.0 mm. It was sewn end to side using 8-0 prolene continuous suture. 2. SVG to OM:  2.0 mm. It was sewn end to side using 7-0 prolene continuous suture. 3. SVG to PDA:  1.6 mm. It was sewn end to side using 7-0 prolene continuous suture.   The proximal vein graft anastomoses were performed to the mid-ascending aorta using continuous 6-0 prolene suture. Graft markers were placed around the proximal anastomoses.   Completion:  The patient was rewarmed to 37 degrees Centigrade. The clamp was removed from the LIMA pedicle and there was rapid warming of the septum and return of ventricular fibrillation. The crossclamp was removed with a time of 62 minutes. There was spontaneous return of sinus rhythm. The distal and proximal anastomoses were checked for hemostasis. The position of the grafts was satisfactory. Two temporary epicardial pacing wires were placed on the right atrium and two on the right ventricle. The patient was weaned from CPB without difficulty on no inotropes. CPB time was 81 minutes. Cardiac output was 5 LPM. TEE showed normal LV function. Heparin was fully reversed with protamine and the aortic and venous cannulas removed. Hemostasis was achieved. Mediastinal and left pleural drainage tubes were placed. The sternum was closed with double #6 stainless steel wires. The fascia was closed with continuous # 1 vicryl suture. The subcutaneous  tissue was closed with 2-0 vicryl continuous suture. The skin was closed with 3-0 vicryl subcuticular suture. All sponge, needle, and instrument counts were reported correct at the end of the case. Dry sterile dressings were placed over the incisions and around the chest tubes which were connected to pleurevac suction. The patient was then transported to the surgical intensive care unit in critical but stable condition.

## 2017-02-13 NOTE — Progress Notes (Signed)
Pre-op Cardiac Surgery  Carotid Findings:  Bilateral 1-39% ICA stenosis, antegrade vertebral flow.  Upper Extremity Right Left  Brachial Pressures 131  Triphasic 122  Triphasic  Radial Waveforms Triphasic Triphasic  Ulnar Waveforms Triphasic Triphasic  Palmar Arch (Allen's Test) Palmar waveforms are obliterated with radial and ulnar compression. Palmar waveforms are obliterated with radial compression and are diminished greater than fifty percent with ulnar compression.    Lower  Extremity Right Left  Dorsalis Pedis 128 133  Posterior Tibial 139 128  Ankle/Brachial Indices 1.06 1.02   Findings:   Right ABI of 1.06 and left ABI of 1.02 are suggestive of arterial flow within normal limits at rest.  [place], RVT 12:20 PM  02/13/2017

## 2017-02-14 ENCOUNTER — Inpatient Hospital Stay (HOSPITAL_COMMUNITY): Payer: PPO

## 2017-02-14 LAB — VAS US DOPPLER PRE CABG
LCCADDIAS: -21 cm/s
LCCADSYS: -84 cm/s
LEFT ECA DIAS: -18 cm/s
LEFT VERTEBRAL DIAS: -22 cm/s
LICADDIAS: -37 cm/s
LICADSYS: -95 cm/s
LICAPSYS: -93 cm/s
Left CCA prox dias: 24 cm/s
Left CCA prox sys: 106 cm/s
Left ICA prox dias: -17 cm/s
RCCAPSYS: 94 cm/s
RIGHT ECA DIAS: -15 cm/s
RIGHT VERTEBRAL DIAS: 8 cm/s
Right CCA prox dias: 17 cm/s
Right cca dist sys: -72 cm/s

## 2017-02-14 LAB — POCT I-STAT, CHEM 8
BUN: 12 mg/dL (ref 6–20)
BUN: 20 mg/dL (ref 6–20)
CALCIUM ION: 1.18 mmol/L (ref 1.15–1.40)
CHLORIDE: 103 mmol/L (ref 101–111)
CHLORIDE: 109 mmol/L (ref 101–111)
Calcium, Ion: 1.16 mmol/L (ref 1.15–1.40)
Creatinine, Ser: 0.8 mg/dL (ref 0.61–1.24)
Creatinine, Ser: 1.1 mg/dL (ref 0.61–1.24)
GLUCOSE: 125 mg/dL — AB (ref 65–99)
Glucose, Bld: 145 mg/dL — ABNORMAL HIGH (ref 65–99)
HCT: 32 % — ABNORMAL LOW (ref 39.0–52.0)
HEMATOCRIT: 33 % — AB (ref 39.0–52.0)
HEMOGLOBIN: 11.2 g/dL — AB (ref 13.0–17.0)
Hemoglobin: 10.9 g/dL — ABNORMAL LOW (ref 13.0–17.0)
POTASSIUM: 4.4 mmol/L (ref 3.5–5.1)
POTASSIUM: 4.6 mmol/L (ref 3.5–5.1)
SODIUM: 139 mmol/L (ref 135–145)
Sodium: 139 mmol/L (ref 135–145)
TCO2: 22 mmol/L (ref 22–32)
TCO2: 24 mmol/L (ref 22–32)

## 2017-02-14 LAB — GLUCOSE, CAPILLARY
GLUCOSE-CAPILLARY: 116 mg/dL — AB (ref 65–99)
GLUCOSE-CAPILLARY: 157 mg/dL — AB (ref 65–99)
GLUCOSE-CAPILLARY: 127 mg/dL — AB (ref 65–99)
Glucose-Capillary: 117 mg/dL — ABNORMAL HIGH (ref 65–99)
Glucose-Capillary: 179 mg/dL — ABNORMAL HIGH (ref 65–99)

## 2017-02-14 LAB — CBC
HCT: 34.2 % — ABNORMAL LOW (ref 39.0–52.0)
HCT: 32.1 % — ABNORMAL LOW (ref 39.0–52.0)
HEMATOCRIT: 34.8 % — AB (ref 39.0–52.0)
HEMOGLOBIN: 11.1 g/dL — AB (ref 13.0–17.0)
Hemoglobin: 10.9 g/dL — ABNORMAL LOW (ref 13.0–17.0)
Hemoglobin: 10.1 g/dL — ABNORMAL LOW (ref 13.0–17.0)
MCH: 28.5 pg (ref 26.0–34.0)
MCH: 28.7 pg (ref 26.0–34.0)
MCH: 28.8 pg (ref 26.0–34.0)
MCHC: 31.5 g/dL (ref 30.0–36.0)
MCHC: 31.9 g/dL (ref 30.0–36.0)
MCHC: 31.9 g/dL (ref 30.0–36.0)
MCV: 89.5 fL (ref 78.0–100.0)
MCV: 90.2 fL (ref 78.0–100.0)
MCV: 91.2 fL (ref 78.0–100.0)
PLATELETS: 147 10*3/uL — AB (ref 150–400)
Platelets: 132 10*3/uL — ABNORMAL LOW (ref 150–400)
Platelets: 136 10*3/uL — ABNORMAL LOW (ref 150–400)
RBC: 3.52 MIL/uL — ABNORMAL LOW (ref 4.22–5.81)
RBC: 3.82 MIL/uL — ABNORMAL LOW (ref 4.22–5.81)
RBC: 3.86 MIL/uL — ABNORMAL LOW (ref 4.22–5.81)
RDW: 13.4 % (ref 11.5–15.5)
RDW: 13.4 % (ref 11.5–15.5)
RDW: 14 % (ref 11.5–15.5)
WBC: 13.7 10*3/uL — ABNORMAL HIGH (ref 4.0–10.5)
WBC: 15 10*3/uL — AB (ref 4.0–10.5)
WBC: 13.6 10*3/uL — AB (ref 4.0–10.5)

## 2017-02-14 LAB — CREATININE, SERUM
Creatinine, Ser: 0.84 mg/dL (ref 0.61–1.24)
Creatinine, Ser: 1.28 mg/dL — ABNORMAL HIGH (ref 0.61–1.24)
GFR calc Af Amer: >60 mL/min (ref >60)
GFR calc non Af Amer: 54 mL/min — ABNORMAL LOW (ref >60)

## 2017-02-14 LAB — BASIC METABOLIC PANEL
ANION GAP: 5 (ref 5–15)
BUN: 13 mg/dL (ref 6–20)
CHLORIDE: 111 mmol/L (ref 101–111)
CO2: 22 mmol/L (ref 22–32)
Calcium: 7.9 mg/dL — ABNORMAL LOW (ref 8.9–10.3)
Creatinine, Ser: 0.89 mg/dL (ref 0.61–1.24)
GFR calc non Af Amer: >60 mL/min (ref >60)
Glucose, Bld: 119 mg/dL — ABNORMAL HIGH (ref 65–99)
Potassium: 4.5 mmol/L (ref 3.5–5.1)
Sodium: 138 mmol/L (ref 135–145)

## 2017-02-14 LAB — POCT I-STAT 3, ART BLOOD GAS (G3+)
Acid-base deficit: 7 mmol/L — ABNORMAL HIGH (ref 0.0–2.0)
Bicarbonate: 19.9 mmol/L — ABNORMAL LOW (ref 20.0–28.0)
O2 SAT: 96 %
PCO2 ART: 43 mmHg (ref 32.0–48.0)
PH ART: 7.275 — AB (ref 7.350–7.450)
PO2 ART: 95 mmHg (ref 83.0–108.0)
Patient temperature: 37.1
TCO2: 21 mmol/L — ABNORMAL LOW (ref 22–32)

## 2017-02-14 LAB — MAGNESIUM
Magnesium: 3.3 mg/dL — ABNORMAL HIGH (ref 1.7–2.4)
Magnesium: 2.7 mg/dL — ABNORMAL HIGH (ref 1.7–2.4)
Magnesium: 2.6 mg/dL — ABNORMAL HIGH (ref 1.7–2.4)

## 2017-02-14 MED ORDER — INSULIN ASPART 100 UNIT/ML ~~LOC~~ SOLN
0.0000 [IU] | SUBCUTANEOUS | Status: DC
  Administered 2017-02-14 (×2): 2 [IU] via SUBCUTANEOUS
  Administered 2017-02-14: 4 [IU] via SUBCUTANEOUS
  Administered 2017-02-15 (×4): 2 [IU] via SUBCUTANEOUS

## 2017-02-14 MED ORDER — FUROSEMIDE 10 MG/ML IJ SOLN
20.0000 mg | Freq: Once | INTRAMUSCULAR | Status: AC
  Administered 2017-02-14: 20 mg via INTRAVENOUS
  Filled 2017-02-14: qty 2

## 2017-02-14 NOTE — Progress Notes (Signed)
1 Day Post-Op Procedure(s) (LRB): CORONARY ARTERY BYPASS GRAFTING (CABG) x3 (LIMA to LAD, SVG to CIRCUMFLEX , SVG to PDA )  , using left internal mammary artery and right leg greater saphenous vein harvested endoscopically (N/A) TRANSESOPHAGEAL ECHOCARDIOGRAM (TEE) (N/A) Subjective: Doing well after CABG  Objective: Vital signs in last 24 hours: Temp:  [97 F (36.1 C)-99.3 F (37.4 C)] 98.8 F (37.1 C) (09/15 1000) Pulse Rate:  [54-91] 73 (09/15 1000) Cardiac Rhythm: Normal sinus rhythm (09/15 0825) Resp:  [10-28] 15 (09/15 1000) BP: (82-128)/(52-79) 101/63 (09/15 1000) SpO2:  [90 %-100 %] 97 % (09/15 1000) Arterial Line BP: (82-143)/(47-75) 116/47 (09/15 1000) FiO2 (%):  [40 %-50 %] 40 % (09/14 2243) Weight:  [182 lb 12.8 oz (82.9 kg)] 182 lb 12.8 oz (82.9 kg) (09/15 0342)  Hemodynamic parameters for last 24 hours: PAP: (17-40)/(6-28) 28/6 CO:  [4 L/min-6.8 L/min] 6.8 L/min CI:  [2.1 L/min/m2-3.6 L/min/m2] 3.6 L/min/m2  Intake/Output from previous day: 09/14 0701 - 09/15 0700 In: 5100.6 [I.V.:3300.6; IV Piggyback:1800] Out: 8144 [Urine:2595; Emesis/NG output:60; Blood:700; Chest Tube:320] Intake/Output this shift: Total I/O In: 417.5 [P.O.:240; I.V.:127.5; IV Piggyback:50] Out: 125 [Urine:125]       Exam    General- alert and comfortable   Lungs- clear without rales, wheezes   Cor- regular rate and rhythm, no murmur , gallop   Abdomen- soft, non-tender   Extremities - warm, non-tender, minimal edema   Neuro- oriented, appropriate, no focal weakness   Lab Results:  Recent Labs  02/13/17 2345 02/13/17 2351 02/14/17 0309  WBC 13.6*  --  15.0*  HGB 11.1* 11.2* 10.9*  HCT 34.8* 33.0* 34.2*  PLT 136*  --  147*   BMET:  Recent Labs  02/12/17 0527  02/13/17 2351 02/14/17 0309  NA 140  < > 139 138  K 3.7  < > 4.6 4.5  CL 108  < > 109 111  CO2 27  --   --  22  GLUCOSE 106*  < > 125* 119*  BUN 19  < > 12 13  CREATININE 1.06  < > 0.80 0.89  CALCIUM 8.5*  --    --  7.9*  < > = values in this interval not displayed.  PT/INR:  Recent Labs  02/13/17 1849  LABPROT 15.7*  INR 1.26   ABG    Component Value Date/Time   PHART 7.275 (L) 02/14/2017 0019   HCO3 19.9 (L) 02/14/2017 0019   TCO2 21 (L) 02/14/2017 0019   ACIDBASEDEF 7.0 (H) 02/14/2017 0019   O2SAT 96.0 02/14/2017 0019   CBG (last 3)   Recent Labs  02/13/17 2100 02/13/17 2155 02/14/17 0313  GLUCAP 112* 109* 117*    Assessment/Plan: S/P Procedure(s) (LRB): CORONARY ARTERY BYPASS GRAFTING (CABG) x3 (LIMA to LAD, SVG to CIRCUMFLEX , SVG to PDA )  , using left internal mammary artery and right leg greater saphenous vein harvested endoscopically (N/A) TRANSESOPHAGEAL ECHOCARDIOGRAM (TEE) (N/A) progression ordeers DC lines, Foley is bothering patient  LOS: 2 days    Tharon Aquas Trigt III 02/14/2017

## 2017-02-14 NOTE — Anesthesia Postprocedure Evaluation (Signed)
Anesthesia Post Note  Patient: BUREL KAHRE  Procedure(s) Performed: Procedure(s) (LRB): CORONARY ARTERY BYPASS GRAFTING (CABG) x3 (LIMA to LAD, SVG to CIRCUMFLEX , SVG to PDA )  , using left internal mammary artery and right leg greater saphenous vein harvested endoscopically (N/A) TRANSESOPHAGEAL ECHOCARDIOGRAM (TEE) (N/A)     Patient location during evaluation: SICU Anesthesia Type: General Level of consciousness: awake and alert, patient cooperative and oriented Pain management: pain level controlled Vital Signs Assessment: post-procedure vital signs reviewed and stable Respiratory status: spontaneous breathing, nonlabored ventilation, respiratory function stable and patient connected to nasal cannula oxygen Cardiovascular status: blood pressure returned to baseline and stable Postop Assessment: no apparent nausea or vomiting and adequate PO intake Anesthetic complications: no    Last Vitals:  Vitals:   02/14/17 1900 02/14/17 1945  BP: (!) 87/47 (!) 87/47  Pulse: 67 73  Resp: 16 17  Temp:  36.8 C  SpO2: 94% 92%    Last Pain:  Vitals:   02/14/17 1945  TempSrc: Oral  PainSc:                  Aleayah [place],E. Alesandro Stueve

## 2017-02-14 NOTE — Plan of Care (Signed)
Problem: Activity: Goal: Risk for activity intolerance will decrease Outcome: Progressing OOB to chair; ambulate 2x  Problem: Fluid Volume: Goal: Ability to maintain a balanced intake and output will improve Outcome: Progressing Foley removed; patient void x1  Problem: Nutrition: Goal: Adequate nutrition will be maintained Outcome: Progressing Diet advanced to cardiac; good appetite; no nausea; passing flatus

## 2017-02-14 NOTE — Progress Notes (Signed)
  CT surgery p.m. 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 Richard Rivas 02/14/2017

## 2017-02-15 ENCOUNTER — Inpatient Hospital Stay (HOSPITAL_COMMUNITY): Payer: PPO

## 2017-02-15 LAB — CBC
HCT: 31.8 % — ABNORMAL LOW (ref 39.0–52.0)
Hemoglobin: 10.2 g/dL — ABNORMAL LOW (ref 13.0–17.0)
MCH: 29.1 pg (ref 26.0–34.0)
MCHC: 32.1 g/dL (ref 30.0–36.0)
MCV: 90.6 fL (ref 78.0–100.0)
Platelets: 119 10*3/uL — ABNORMAL LOW (ref 150–400)
RBC: 3.51 MIL/uL — ABNORMAL LOW (ref 4.22–5.81)
RDW: 14.3 % (ref 11.5–15.5)
WBC: 14 10*3/uL — ABNORMAL HIGH (ref 4.0–10.5)

## 2017-02-15 LAB — GLUCOSE, CAPILLARY
GLUCOSE-CAPILLARY: 120 mg/dL — AB (ref 65–99)
GLUCOSE-CAPILLARY: 117 mg/dL — AB (ref 65–99)
Glucose-Capillary: 126 mg/dL — ABNORMAL HIGH (ref 65–99)
Glucose-Capillary: 132 mg/dL — ABNORMAL HIGH (ref 65–99)
Glucose-Capillary: 138 mg/dL — ABNORMAL HIGH (ref 65–99)

## 2017-02-15 LAB — BASIC METABOLIC PANEL
Anion gap: 3 — ABNORMAL LOW (ref 5–15)
BUN: 23 mg/dL — ABNORMAL HIGH (ref 6–20)
CO2: 26 mmol/L (ref 22–32)
Calcium: 8 mg/dL — ABNORMAL LOW (ref 8.9–10.3)
Chloride: 106 mmol/L (ref 101–111)
Creatinine, Ser: 1.06 mg/dL (ref 0.61–1.24)
GFR calc Af Amer: >60 mL/min (ref >60)
GFR calc non Af Amer: >60 mL/min (ref >60)
Glucose, Bld: 119 mg/dL — ABNORMAL HIGH (ref 65–99)
Potassium: 4.2 mmol/L (ref 3.5–5.1)
Sodium: 135 mmol/L (ref 135–145)

## 2017-02-15 MED ORDER — FUROSEMIDE 10 MG/ML IJ SOLN
20.0000 mg | Freq: Once | INTRAMUSCULAR | Status: AC
  Administered 2017-02-15: 20 mg via INTRAVENOUS
  Filled 2017-02-15: qty 2

## 2017-02-15 MED ORDER — AMIODARONE HCL IN DEXTROSE 360-4.14 MG/200ML-% IV SOLN
60.0000 mg/h | INTRAVENOUS | Status: AC
  Administered 2017-02-15 (×2): 60 mg/h via INTRAVENOUS
  Filled 2017-02-15: qty 200

## 2017-02-15 MED ORDER — AMIODARONE IV BOLUS ONLY 150 MG/100ML
150.0000 mg | Freq: Once | INTRAVENOUS | Status: AC
  Administered 2017-02-15: 150 mg via INTRAVENOUS

## 2017-02-15 MED ORDER — AMIODARONE HCL IN DEXTROSE 360-4.14 MG/200ML-% IV SOLN
INTRAVENOUS | Status: AC
  Filled 2017-02-15: qty 200

## 2017-02-15 MED ORDER — DILTIAZEM HCL 100 MG IV SOLR
5.0000 mg/h | INTRAVENOUS | Status: AC
  Administered 2017-02-15: 5 mg/h via INTRAVENOUS
  Filled 2017-02-15: qty 100

## 2017-02-15 MED ORDER — AMIODARONE HCL IN DEXTROSE 360-4.14 MG/200ML-% IV SOLN
30.0000 mg/h | INTRAVENOUS | Status: DC
  Administered 2017-02-15: 30 mg/h via INTRAVENOUS
  Filled 2017-02-15: qty 200

## 2017-02-15 NOTE — Plan of Care (Signed)
Problem: Activity: Goal: Risk for activity intolerance will decrease Outcome: Progressing OOB to chair and ambulate 3x today  Problem: Nutrition: Goal: Adequate nutrition will be maintained Outcome: Progressing Appetite good to fair; no nausea  Problem: Pain Management: Goal: Pain level will decrease Outcome: Progressing Pain better managed with PO narcotics

## 2017-02-15 NOTE — Progress Notes (Signed)
2 Days Post-Op Procedure(s) (LRB): CORONARY ARTERY BYPASS GRAFTING (CABG) x3 (LIMA to LAD, SVG to CIRCUMFLEX , SVG to PDA )  , using left internal mammary artery and right leg greater saphenous vein harvested endoscopically (N/A) TRANSESOPHAGEAL ECHOCARDIOGRAM (TEE) (N/A) Subjective: Feels well but in rapid afib this am Iv amoio started Objective: Vital signs in last 24 hours: Temp:  [98 F (36.7 C)-99.3 F (37.4 C)] 98.3 F (36.8 C) (09/16 0812) Pulse Rate:  [55-121] 100 (09/16 0930) Cardiac Rhythm: Atrial fibrillation (09/16 0830) Resp:  [12-25] 18 (09/16 0930) BP: (87-131)/(45-82) 108/62 (09/16 0930) SpO2:  [86 %-98 %] 92 % (09/16 0930) Arterial Line BP: (100-120)/(40-50) 100/40 (09/15 1400) Weight:  [181 lb 8 oz (82.3 kg)] 181 lb 8 oz (82.3 kg) (09/16 0700)  Hemodynamic parameters for last 24 hours: PAP: (111)/(87) 111/87  Intake/Output from previous day: 09/15 0701 - 09/16 0700 In: 2077.5 [P.O.:600; I.V.:1327.5; IV Piggyback:150] Out: 550 [Urine:550] Intake/Output this shift: Total I/O In: 33.3 [I.V.:33.3] Out: -        Exam    General- alert and comfortable   Lungs- clear without rales, wheezes   Cor- regular rate and rhythm, no murmur , gallop   Abdomen- soft, non-tender   Extremities - warm, non-tender, minimal edema   Neuro- oriented, appropriate, no focal weakness   Lab Results:  Recent Labs  02/14/17 1821 02/15/17 0349  WBC 13.7* 14.0*  HGB 10.1* 10.2*  HCT 32.1* 31.8*  PLT 132* 119*   BMET:  Recent Labs  02/14/17 0309 02/14/17 1723 02/14/17 1821 02/15/17 0349  NA 138 139  --  135  K 4.5 4.4  --  4.2  CL 111 103  --  106  CO2 22  --   --  26  GLUCOSE 119* 145*  --  119*  BUN 13 20  --  23*  CREATININE 0.89 1.10 1.28* 1.06  CALCIUM 7.9*  --   --  8.0*    PT/INR:  Recent Labs  02/13/17 1849  LABPROT 15.7*  INR 1.26   ABG    Component Value Date/Time   PHART 7.275 (L) 02/14/2017 0019   HCO3 19.9 (L) 02/14/2017 0019   TCO2 24  02/14/2017 1723   ACIDBASEDEF 7.0 (H) 02/14/2017 0019   O2SAT 96.0 02/14/2017 0019   CBG (last 3)   Recent Labs  02/14/17 2305 02/15/17 0331 02/15/17 0810  GLUCAP 127* 120* 126*    Assessment/Plan: S/P Procedure(s) (LRB): CORONARY ARTERY BYPASS GRAFTING (CABG) x3 (LIMA to LAD, SVG to CIRCUMFLEX , SVG to PDA )  , using left internal mammary artery and right leg greater saphenous vein harvested endoscopically (N/A) TRANSESOPHAGEAL ECHOCARDIOGRAM (TEE) (N/A) Mobilize Diuresis Diabetes control keep in 2 H while in rapid AF   LOS: 3 days    Richard Rivas 02/15/2017

## 2017-02-15 NOTE — Progress Notes (Signed)
Patient converted to Afib at 8:20am. Strip to chart. Dr Lucianne Lei Tright notified. Amiodarone initiated.

## 2017-02-16 ENCOUNTER — Inpatient Hospital Stay (HOSPITAL_COMMUNITY): Payer: PPO

## 2017-02-16 ENCOUNTER — Encounter (HOSPITAL_COMMUNITY): Payer: Self-pay | Admitting: Surgery

## 2017-02-16 LAB — GLUCOSE, CAPILLARY
GLUCOSE-CAPILLARY: 100 mg/dL — AB (ref 65–99)
GLUCOSE-CAPILLARY: 101 mg/dL — AB (ref 65–99)
GLUCOSE-CAPILLARY: 110 mg/dL — AB (ref 65–99)
GLUCOSE-CAPILLARY: 118 mg/dL — AB (ref 65–99)
Glucose-Capillary: 100 mg/dL — ABNORMAL HIGH (ref 65–99)
Glucose-Capillary: 118 mg/dL — ABNORMAL HIGH (ref 65–99)

## 2017-02-16 LAB — BASIC METABOLIC PANEL
Anion gap: 5 (ref 5–15)
BUN: 24 mg/dL — ABNORMAL HIGH (ref 6–20)
CO2: 28 mmol/L (ref 22–32)
Calcium: 8 mg/dL — ABNORMAL LOW (ref 8.9–10.3)
Chloride: 103 mmol/L (ref 101–111)
Creatinine, Ser: 0.87 mg/dL (ref 0.61–1.24)
GFR calc Af Amer: >60 mL/min (ref >60)
GFR calc non Af Amer: >60 mL/min (ref >60)
Glucose, Bld: 108 mg/dL — ABNORMAL HIGH (ref 65–99)
Potassium: 3.9 mmol/L (ref 3.5–5.1)
Sodium: 136 mmol/L (ref 135–145)

## 2017-02-16 LAB — CBC
HCT: 29 % — ABNORMAL LOW (ref 39.0–52.0)
Hemoglobin: 9.2 g/dL — ABNORMAL LOW (ref 13.0–17.0)
MCH: 28.8 pg (ref 26.0–34.0)
MCHC: 31.7 g/dL (ref 30.0–36.0)
MCV: 90.6 fL (ref 78.0–100.0)
Platelets: 108 10*3/uL — ABNORMAL LOW (ref 150–400)
RBC: 3.2 MIL/uL — ABNORMAL LOW (ref 4.22–5.81)
RDW: 14 % (ref 11.5–15.5)
WBC: 11.9 10*3/uL — ABNORMAL HIGH (ref 4.0–10.5)

## 2017-02-16 MED ORDER — AMIODARONE HCL 200 MG PO TABS
400.0000 mg | ORAL_TABLET | Freq: Two times a day (BID) | ORAL | Status: DC
  Administered 2017-02-16 – 2017-02-17 (×4): 400 mg via ORAL
  Filled 2017-02-16 (×4): qty 2

## 2017-02-16 NOTE — Progress Notes (Signed)
3 Days Post-Op Procedure(s) (LRB): CORONARY ARTERY BYPASS GRAFTING (CABG) x3 (LIMA to LAD, SVG to CIRCUMFLEX , SVG to PDA )  , using left internal mammary artery and right leg greater saphenous vein harvested endoscopically (N/A) TRANSESOPHAGEAL ECHOCARDIOGRAM (TEE) (N/A) Subjective:  No chest pain or shortness of breath with ambulation.  Objective: Vital signs in last 24 hours: Temp:  [97.8 F (36.6 C)-98.4 F (36.9 C)] 97.8 F (36.6 C) (09/17 0400) Pulse Rate:  [52-121] 60 (09/17 0700) Cardiac Rhythm: Normal sinus rhythm (09/17 0400) Resp:  [13-25] 13 (09/17 0700) BP: (90-115)/(47-76) 106/73 (09/17 0700) SpO2:  [86 %-100 %] 98 % (09/17 0700) Weight:  [82.4 kg (181 lb 10.5 oz)] 82.4 kg (181 lb 10.5 oz) (09/17 0500)  Hemodynamic parameters for last 24 hours:    Intake/Output from previous day: 09/16 0701 - 09/17 0700 In: 1764.8 [P.O.:600; I.V.:1114.8; IV Piggyback:50] Out: 1150 [Urine:1150] Intake/Output this shift: No intake/output data recorded.  General appearance: alert and cooperative Neurologic: intact Heart: regular rate and rhythm, S1, S2 normal, no murmur, click, rub or gallop Lungs: clear to auscultation bilaterally Extremities: extremities normal, atraumatic, no cyanosis or edema Wound: incisions ok  Lab Results:  Recent Labs  02/15/17 0349 02/16/17 0330  WBC 14.0* 11.9*  HGB 10.2* 9.2*  HCT 31.8* 29.0*  PLT 119* 108*   BMET:  Recent Labs  02/15/17 0349 02/16/17 0330  NA 135 136  K 4.2 3.9  CL 106 103  CO2 26 28  GLUCOSE 119* 108*  BUN 23* 24*  CREATININE 1.06 0.87  CALCIUM 8.0* 8.0*    PT/INR:  Recent Labs  02/13/17 1849  LABPROT 15.7*  INR 1.26   ABG    Component Value Date/Time   PHART 7.275 (L) 02/14/2017 0019   HCO3 19.9 (L) 02/14/2017 0019   TCO2 24 02/14/2017 1723   ACIDBASEDEF 7.0 (H) 02/14/2017 0019   O2SAT 96.0 02/14/2017 0019   CBG (last 3)   Recent Labs  02/15/17 1959 02/16/17 0021 02/16/17 0332  GLUCAP 117*  110* 100*   CXR: bibasilar atelectasis  Assessment/Plan: S/P Procedure(s) (LRB): CORONARY ARTERY BYPASS GRAFTING (CABG) x3 (LIMA to LAD, SVG to CIRCUMFLEX , SVG to PDA )  , using left internal mammary artery and right leg greater saphenous vein harvested endoscopically (N/A) TRANSESOPHAGEAL ECHOCARDIOGRAM (TEE) (N/A)  POD 3  He is hemodynamically stable in sinus rhythm 60's on IV amiodarone. Will switch to po. Hold off on Lopressor for now with HR 60's on amio.  Postop atrial fib: as above. Switch IV amio to po.  Volume excess: continue diuretic and KCL.  Bibasilar atelectasis: continue IS and ambulation  Transfer to 4E.   LOS: 4 days    [name] 02/16/2017

## 2017-02-16 NOTE — Progress Notes (Signed)
Patient ID: Richard Rivas, male   DOB: [birth year], 76 y.o.   MRN: [id number] EVENING ROUNDS NOTE :     [address]       [place] [address]             [phone]                 3 Days Post-Op Procedure(s) (LRB): CORONARY ARTERY BYPASS GRAFTING (CABG) x3 (LIMA to LAD, SVG to CIRCUMFLEX , SVG to PDA )  , using left internal mammary artery and right leg greater saphenous vein harvested endoscopically (N/A) TRANSESOPHAGEAL ECHOCARDIOGRAM (TEE) (N/A)  Total Length of Stay:  LOS: 4 days  BP (!) 142/52 (BP Location: Right Arm)   Pulse (!) 58   Temp 97.7 F (36.5 C) (Oral)   Resp 20   Ht 5\' 8"  (1.727 m)   Wt 181 lb 10.5 oz (82.4 kg)   SpO2 92%   BMI 27.62 kg/m   .Intake/Output      09/16 0701 - 09/17 0700 09/17 0701 - 09/18 0700   P.O. 600 600   I.V. (mL/kg) 1151.5 (14) 129 (1.6)   IV Piggyback 50    Total Intake(mL/kg) 1801.5 (21.9) 729 (8.8)   Urine (mL/kg/hr) 1150 (0.6)    Total Output 1150     Net +651.5 +729        Urine Occurrence 201 x 2 x     . sodium chloride Stopped (02/14/17 1900)  . sodium chloride Stopped (02/14/17 1900)  . sodium chloride Stopped (02/16/17 1100)  . lactated ringers Stopped (02/14/17 1900)  . lactated ringers Stopped (02/14/17 1900)  . phenylephrine (NEO-SYNEPHRINE) Adult infusion Stopped (02/14/17 1900)     Lab Results  Component Value Date   WBC 11.9 (H) 02/16/2017   HGB 9.2 (L) 02/16/2017   HCT 29.0 (L) 02/16/2017   PLT 108 (L) 02/16/2017   GLUCOSE 108 (H) 02/16/2017   CHOL 150 02/12/2017   TRIG 102 02/12/2017   HDL 43 02/12/2017   LDLCALC 87 02/12/2017   NA 136 02/16/2017   K 3.9 02/16/2017   CL 103 02/16/2017   CREATININE 0.87 02/16/2017   BUN 24 (H) 02/16/2017   CO2 28 02/16/2017   TSH 1.179 02/11/2017   INR 1.26 02/13/2017   Stable waiting for4E bed   Grace Isaac MD  Beeper [phone] Office [phone] 02/16/2017 6:34 PM

## 2017-02-17 LAB — GLUCOSE, CAPILLARY
GLUCOSE-CAPILLARY: 96 mg/dL (ref 65–99)
Glucose-Capillary: 95 mg/dL (ref 65–99)
Glucose-Capillary: 87 mg/dL (ref 65–99)

## 2017-02-17 MED ORDER — SODIUM CHLORIDE 0.9% FLUSH: 3.0000 mL | INTRAVENOUS | Status: DC | PRN

## 2017-02-17 MED ORDER — ACETAMINOPHEN 325 MG PO TABS: 650.0000 mg | ORAL_TABLET | Freq: Four times a day (QID) | ORAL | Status: DC | PRN

## 2017-02-17 MED ORDER — ONDANSETRON HCL 4 MG/2ML IJ SOLN: 4.0000 mg | Freq: Four times a day (QID) | INTRAMUSCULAR | Status: DC | PRN

## 2017-02-17 MED ORDER — POTASSIUM CHLORIDE CRYS ER 20 MEQ PO TBCR
20.0000 meq | EXTENDED_RELEASE_TABLET | Freq: Two times a day (BID) | ORAL | Status: DC
  Administered 2017-02-17 – 2017-02-18 (×3): 20 meq via ORAL
  Filled 2017-02-17 (×3): qty 1

## 2017-02-17 MED ORDER — SODIUM CHLORIDE 0.9 % IV SOLN: 250.0000 mL | INTRAVENOUS | Status: DC | PRN

## 2017-02-17 MED ORDER — MOVING RIGHT ALONG BOOK
Freq: Once | Status: DC
  Filled 2017-02-17 (×2): qty 1

## 2017-02-17 MED ORDER — SODIUM CHLORIDE 0.9% FLUSH
3.0000 mL | Freq: Two times a day (BID) | INTRAVENOUS | Status: DC
  Administered 2017-02-17 – 2017-02-18 (×2): 3 mL via INTRAVENOUS

## 2017-02-17 MED ORDER — BISACODYL 10 MG RE SUPP: 10.0000 mg | Freq: Every day | RECTAL | Status: DC | PRN

## 2017-02-17 MED ORDER — PANTOPRAZOLE SODIUM 40 MG PO TBEC
40.0000 mg | DELAYED_RELEASE_TABLET | Freq: Every day | ORAL | Status: DC
  Administered 2017-02-18: 40 mg via ORAL
  Filled 2017-02-17: qty 1

## 2017-02-17 MED ORDER — ONDANSETRON HCL 4 MG PO TABS: 4.0000 mg | ORAL_TABLET | Freq: Four times a day (QID) | ORAL | Status: DC | PRN

## 2017-02-17 MED ORDER — DOCUSATE SODIUM 100 MG PO CAPS
200.0000 mg | ORAL_CAPSULE | Freq: Every day | ORAL | Status: DC
  Administered 2017-02-18: 200 mg via ORAL
  Filled 2017-02-17: qty 2

## 2017-02-17 MED ORDER — FUROSEMIDE 40 MG PO TABS
40.0000 mg | ORAL_TABLET | Freq: Every day | ORAL | Status: DC
  Administered 2017-02-17 – 2017-02-18 (×2): 40 mg via ORAL
  Filled 2017-02-17 (×2): qty 1

## 2017-02-17 MED ORDER — ASPIRIN EC 325 MG PO TBEC
325.0000 mg | DELAYED_RELEASE_TABLET | Freq: Every day | ORAL | Status: DC
  Administered 2017-02-18: 325 mg via ORAL
  Filled 2017-02-17: qty 1

## 2017-02-17 MED ORDER — OXYCODONE HCL 5 MG PO TABS
5.0000 mg | ORAL_TABLET | ORAL | Status: DC | PRN
Start: 2017-02-17 — End: 2017-02-18
  Administered 2017-02-17 – 2017-02-18 (×2): 10 mg via ORAL
  Filled 2017-02-17 (×2): qty 2

## 2017-02-17 MED ORDER — BISACODYL 5 MG PO TBEC: 10.0000 mg | DELAYED_RELEASE_TABLET | Freq: Every day | ORAL | Status: DC | PRN

## 2017-02-17 MED ORDER — TRAMADOL HCL 50 MG PO TABS: 50.0000 mg | ORAL_TABLET | ORAL | Status: DC | PRN

## 2017-02-17 NOTE — Progress Notes (Signed)
4 Days Post-Op Procedure(s) (LRB): CORONARY ARTERY BYPASS GRAFTING (CABG) x3 (LIMA to LAD, SVG to CIRCUMFLEX , SVG to PDA )  , using left internal mammary artery and right leg greater saphenous vein harvested endoscopically (N/A) TRANSESOPHAGEAL ECHOCARDIOGRAM (TEE) (N/A) Subjective:  No complaints  Objective: Vital signs in last 24 hours: Temp:  [97.7 F (36.5 C)-98.2 F (36.8 C)] 98.2 F (36.8 C) (09/18 0842) Pulse Rate:  [54-85] 57 (09/18 1221) Cardiac Rhythm: Sinus bradycardia (09/18 1207) Resp:  [11-21] 18 (09/18 1221) BP: (92-142)/(47-83) 114/55 (09/18 1019) SpO2:  [91 %-99 %] 93 % (09/18 1221) Weight:  [81.8 kg (180 lb 5.4 oz)] 81.8 kg (180 lb 5.4 oz) (09/18 0400)  Hemodynamic parameters for last 24 hours:    Intake/Output from previous day: 09/17 0701 - 09/18 0700 In: 729 [P.O.:600; I.V.:129] Out: -  Intake/Output this shift: No intake/output data recorded.  General appearance: alert and cooperative Neurologic: intact Heart: regular rate and rhythm, S1, S2 normal, no murmur, click, rub or gallop Lungs: clear to auscultation bilaterally Extremities: extremities normal, atraumatic, no cyanosis or edema Wound: incisions healing well  Lab Results:  Recent Labs  02/15/17 0349 02/16/17 0330  WBC 14.0* 11.9*  HGB 10.2* 9.2*  HCT 31.8* 29.0*  PLT 119* 108*   BMET:  Recent Labs  02/15/17 0349 02/16/17 0330  NA 135 136  K 4.2 3.9  CL 106 103  CO2 26 28  GLUCOSE 119* 108*  BUN 23* 24*  CREATININE 1.06 0.87  CALCIUM 8.0* 8.0*    PT/INR: No results for input(s): LABPROT, INR in the last 72 hours. ABG    Component Value Date/Time   PHART 7.275 (L) 02/14/2017 0019   HCO3 19.9 (L) 02/14/2017 0019   TCO2 24 02/14/2017 1723   ACIDBASEDEF 7.0 (H) 02/14/2017 0019   O2SAT 96.0 02/14/2017 0019   CBG (last 3)   Recent Labs  02/17/17 0005 02/17/17 0346 02/17/17 0834  GLUCAP 96 95 87    Assessment/Plan: S/P Procedure(s) (LRB): CORONARY ARTERY BYPASS  GRAFTING (CABG) x3 (LIMA to LAD, SVG to CIRCUMFLEX , SVG to PDA )  , using left internal mammary artery and right leg greater saphenous vein harvested endoscopically (N/A) TRANSESOPHAGEAL ECHOCARDIOGRAM (TEE) (N/A)  He has been hemodynamically stable in sinus rhythm and ambulating well without chest pain or SOB. He is still waiting on bed on 4E. Probably be ready to go home tomorrow.   LOS: 5 days    [name] 02/17/2017

## 2017-02-17 NOTE — Progress Notes (Signed)
Telephone report called & given to Richard Rivas, [place]. Pt and wife both updated on transfer to 4E24. VSS. Christine, RN, transported pt to 4E in stable condition.

## 2017-02-18 MED ORDER — ASPIRIN 325 MG PO TBEC
325.0000 mg | DELAYED_RELEASE_TABLET | Freq: Every day | ORAL | 0 refills | Status: DC
Start: 2017-02-18 — End: 2019-01-03

## 2017-02-18 MED ORDER — AMIODARONE HCL 200 MG PO TABS
200.0000 mg | ORAL_TABLET | Freq: Two times a day (BID) | ORAL | Status: DC
  Administered 2017-02-18: 200 mg via ORAL
  Filled 2017-02-18: qty 1

## 2017-02-18 MED ORDER — CEPHALEXIN 500 MG PO CAPS: 500.0000 mg | ORAL_CAPSULE | Freq: Three times a day (TID) | ORAL | 0 refills | Status: DC

## 2017-02-18 MED ORDER — AMIODARONE HCL 200 MG PO TABS: 200.0000 mg | ORAL_TABLET | Freq: Two times a day (BID) | ORAL | 1 refills | Status: DC

## 2017-02-18 MED ORDER — FUROSEMIDE 40 MG PO TABS
40.0000 mg | ORAL_TABLET | Freq: Every day | ORAL | 0 refills | Status: DC
Start: 2017-02-18 — End: 2017-02-18

## 2017-02-18 MED ORDER — FUROSEMIDE 40 MG PO TABS: 40.0000 mg | ORAL_TABLET | Freq: Every day | ORAL | 0 refills | Status: DC

## 2017-02-18 MED ORDER — TRAMADOL HCL 50 MG PO TABS: ORAL_TABLET | ORAL | 0 refills | Status: DC

## 2017-02-18 MED ORDER — POTASSIUM CHLORIDE CRYS ER 20 MEQ PO TBCR: 20.0000 meq | EXTENDED_RELEASE_TABLET | Freq: Every day | ORAL | 0 refills | Status: DC

## 2017-02-18 MED ORDER — CEPHALEXIN 500 MG PO CAPS
500.0000 mg | ORAL_CAPSULE | Freq: Three times a day (TID) | ORAL | Status: DC
  Administered 2017-02-18: 500 mg via ORAL
  Filled 2017-02-18: qty 1

## 2017-02-18 MED ORDER — ACETAMINOPHEN 325 MG PO TABS: 650.0000 mg | ORAL_TABLET | Freq: Four times a day (QID) | ORAL | Status: DC | PRN

## 2017-02-18 NOTE — Progress Notes (Signed)
[id number] Education completed with pt and wife who voiced understanding. Lots of diet questions. Gave heart healthy diet sheet and discussed watching carbs and sodium. Reviewed IS, sternal precautions, ex ed and CRP 2. Will send letter of interest to [employer] since I cannot write order for Dr Clayborn Bigness and pt is changing cardiologists to [facility]. Graylon Good RN BSN 02/18/2017 10:58 AM

## 2017-02-18 NOTE — Care Management Important Message (Signed)
Important Message  Patient Details  Name: Richard Rivas MRN: [id number] Date of Birth: [birth year]   Medicare Important Message Given:  Yes    [name] May 02/18/2017, 9:57 AM

## 2017-02-18 NOTE — Progress Notes (Addendum)
      [address]       [place] [address]             [phone]        5 Days Post-Op Procedure(s) (LRB): CORONARY ARTERY BYPASS GRAFTING (CABG) x3 (LIMA to LAD, SVG to CIRCUMFLEX , SVG to PDA )  , using left internal mammary artery and right leg greater saphenous vein harvested endoscopically (N/A) TRANSESOPHAGEAL ECHOCARDIOGRAM (TEE) (N/A)  Subjective: He has no complaints this am.  Objective: Vital signs in last 24 hours: Temp:  [98.2 F (36.8 C)-98.8 F (37.1 C)] 98.8 F (37.1 C) (09/19 0407) Pulse Rate:  [57-66] 63 (09/19 0407) Cardiac Rhythm: Normal sinus rhythm (09/19 0700) Resp:  [15-23] 17 (09/19 0407) BP: (109-126)/(55-83) 122/62 (09/19 0407) SpO2:  [92 %-94 %] 94 % (09/19 0407) Weight:  [176 lb 3.2 oz (79.9 kg)] 176 lb 3.2 oz (79.9 kg) (09/19 0407)   Pre op weight  78.3 kg Current Weight  02/18/17 176 lb 3.2 oz (79.9 kg)       Intake/Output from previous day: 09/18 0701 - 09/19 0700 In: 720 [P.O.:720] Out: -    Physical Exam:  Cardiovascular: RRR Pulmonary: Clear to auscultation bilaterally Abdomen: Soft, non tender, bowel sounds present. Extremities: Mild bilateral lower extremity edema. Erythema, tenderness right forearm-phlebitis Wounds: Clean and dry.  No erythema or signs of infection.  Lab Results: CBC: Recent Labs  02/16/17 0330  WBC 11.9*  HGB 9.2*  HCT 29.0*  PLT 108*   BMET:  Recent Labs  02/16/17 0330  NA 136  K 3.9  CL 103  CO2 28  GLUCOSE 108*  BUN 24*  CREATININE 0.87  CALCIUM 8.0*    PT/INR:  Lab Results  Component Value Date   INR 1.26 02/13/2017   INR 1.07 02/12/2017   INR 1.03 02/11/2017   ABG:  INR: Will add last result for INR, ABG once components are confirmed Will add last 4 CBG results once components are confirmed  Assessment/Plan:  1. CV - Previous a fib. Maintaining SR in the 60-70's. On Amiodarone 400 mg bid. Will decrease Amidoarone. No BB secondary to mild  bradycardia 2.  Pulmonary - On room air. Encourage incentive spirometer 3. Volume Overload - On Lasix 40 mg daily. 4.  Acute blood loss anemia - H and H 09/17 stable at 9.2 and 29 5. Thrombocytopenia-last platelets 108,000 6. Remove EPW 7. Chest tube sutures to remain 8. Phlebitis right forearm-start Keflex 9. Likely discharge later today   ZIMMERMAN,DONIELLE MPA-C 02/18/2017,7:32 AM   Chart reviewed, patient examined, agree with above. He has some phlebitis in right forearm from amiodarone infusion. Agree with local treatment for comfort and keflex. He looks good otherwise and is walking without chest pain or shortness of breath. Plan home today.

## 2017-02-18 NOTE — Discharge Summary (Signed)
Physician Discharge Summary       [address]       [place] [address]             [phone]    Patient ID: Richard Rivas MRN: [id number] DOB/AGE: December 25, 1940 76 y.o.  Admit date: 02/12/2017 Discharge date: 02/18/2017  Admission Diagnoses: 1. S/p NSTEMI 2. Coronary artery disease  Active Diagnoses:  1. Hypertension 2. BPPV (benign paroxysmal positional vertigo) 3. Colon polyps 4. Depression 5. GERD (gastroesophageal reflux disease) 6. Kidney stones 7. ABL anemia 9. Thrombocytopenia 10. Post op a fib (converted to sinus rhythm) 11. Phlebitis right forearm  Procedure (s):  LEFT HEART CATH AND CORONARY ANGIOGRAPHY and possible pci by Dr. Clayborn Rivas on 02/12/2017:  Conclusion     Mid RCA lesion, 50 %stenosed.  Prox RCA lesion, 50 %stenosed.  LM lesion, 99 %stenosed.  Ost Cx lesion, 99 %stenosed.  Dist LAD lesion, 50 %stenosed.  The left ventricular systolic function is normal.  LV end diastolic pressure is normal.  The left ventricular ejection fraction is 55-65% by visual estimate.   Conclusion Diagnostic cardiac catheter with normal left ventricular function Severe distal left main disease 99% Severe ostial circumflex disease 99% Moderate LAD disease Moderate RCA disease Recommend transferred for coronary bypass surgery at [facility]    1. Median Sternotomy 2. Extracorporeal circulation 3.   Coronary artery bypass grafting x 3   Left internal mammary graft to the LAD  SVG to OM  SVG to PDA  4.   Endoscopic vein harvest from the right leg by Dr. Cyndia Rivas on 02/13/2017.  History of Presenting Illness: The patient is a 76 year old gentleman with hypertension who was in his usual state of health and at the beach when he developed substernal chest discomfort on Monday while loading his car. This was fairly severe and associated with nausea, shortness of breath and fatigue. He and his wife decided to drive home. His wife drove and he  continue to have waxing and waning pain during the trip home Monday night. He actually drove from [place] to [place] having some chest pain. By the time he got home the pain had resolved but recurred while he was unloading the car early Tuesday morning. He slept and the rest of the day on Tuesday he had no pain and felt fine. Then Wednesday he had recurrent pain while out picking ocra in his garden and he went to [place] yesterday evening. He had a mildly elevated troponin of 0.76 and non-specific ECG changes. He had no further pain at [facility] and had a cath this afternoon by Dr. Clayborn Rivas that showed a 90% distal LM stenosis, 90% ostial LCX, stenosis, 50% mid LAD, 50% proximal and mid RCA. LVEF was normal. He has remained stable without chest pain on heparin. Dr. Cyndia Rivas explained the need for coronary artery bypass grafting surgery. Potential risks, benefits, and complications of the surgery were discussed with the patient and he agreed to proceed with surgery. Pre operative carotid duplex showed no significant internal carotid artery stenosis bilaterally. He underwent a CABG x 3 on 02/13/2017.  Brief Hospital Course:  The patient was extubated late the evening of surgery without difficulty. He/she remained afebrile and hemodynamically stable. Richard Rivas, a line, chest tubes, and foley were removed early in the post operative course . He had a fib post op but converted with Amiodarone. He remained in sinus rhythm on oral Amiodarone. He was not started on Lopressor as his HR was  mostly in the 60's. He was volume over loaded and diuresed. He had ABL anemia. He did not require a post op transfusion. Last H and H was 9.2 and 29. He also had mild thrombocytopenia. His last platelet count was 108,000.  He was weaned off the insulin drip. The patient was felt surgically stable for transfer from the ICU to [facility]U for further convalescence on 02/18/2017. He continues to progress with cardiac rehab. He was ambulating on room  air. He has been tolerating a diet and has had a bowel movement. He did have phlebitis (erythema, tenderness, warmth) of the right forearm secondary to Amiodarone infusion. He was placed on oral Keflex which will be continued for one week. Epicardial pacing wires were removed on 02/18/2017. Chest tube sutures will be removed in the office after discharge. The patient is felt surgically stable for discharge today.   Latest Vital Signs: Blood pressure 124/68, pulse 61, temperature 98.8 F (37.1 C), temperature source Oral, resp. rate 17, height 5\' 8"  (1.727 m), weight 176 lb 3.2 oz (79.9 kg), SpO2 100 %.  Physical Exam: Cardiovascular: RRR Pulmonary: Clear to auscultation bilaterally Abdomen: Soft, non tender, bowel sounds present. Extremities: Mild bilateral lower extremity edema. Erythema, tenderness right forearm-phlebitis Wounds: Clean and dry.  No erythema or signs of infection.  Discharge Condition: Stable and discharged to home.  Recent laboratory studies:  Lab Results  Component Value Date   WBC 11.9 (H) 02/16/2017   HGB 9.2 (L) 02/16/2017   HCT 29.0 (L) 02/16/2017   MCV 90.6 02/16/2017   PLT 108 (L) 02/16/2017   Lab Results  Component Value Date   NA 136 02/16/2017   K 3.9 02/16/2017   CL 103 02/16/2017   CO2 28 02/16/2017   CREATININE 0.87 02/16/2017   GLUCOSE 108 (H) 02/16/2017   Diagnostic Studies:    Dg Chest Port 1 View  Result Date: 02/16/2017 CLINICAL DATA:  CABG EXAM: PORTABLE CHEST 1 VIEW COMPARISON:  02/15/2017 FINDINGS: Low lung volumes with bibasilar atelectasis. Small effusions suspected. Heart is borderline in size. Prior CABG. IMPRESSION: Stable bibasilar atelectasis and small effusions. Electronically Signed   By: Richard Rivas M.D.   On: 02/16/2017 07:45    Discharge Medications: Allergies as of 02/18/2017      Reactions   Bee Venom Swelling      Medication List    STOP taking these medications   heparin 100-0.45 UNIT/ML-% infusion     lisinopril 10 MG tablet Commonly known as:  PRINIVIL,ZESTRIL   metoprolol tartrate 25 MG tablet Commonly known as:  LOPRESSOR     TAKE these medications   acetaminophen 325 MG tablet Commonly known as:  TYLENOL Take 2 tablets (650 mg total) by mouth every 6 (six) hours as needed for mild pain.   amiodarone 200 MG tablet Commonly known as:  PACERONE Take 1 tablet (200 mg total) by mouth 2 (two) times daily. For one week;then take Amiodarone 200 mg by mouth daily thereafter   aspirin 325 MG EC tablet Take 1 tablet (325 mg total) by mouth daily. What changed:  medication strength  how much to take   atorvastatin 80 MG tablet Commonly known as:  LIPITOR Take 1 tablet (80 mg total) by mouth daily at 6 PM.   cephALEXin 500 MG capsule Commonly known as:  KEFLEX Take 1 capsule (500 mg total) by mouth 3 (three) times daily.   FLUoxetine 20 MG capsule Commonly known as:  PROZAC Take 20 mg by mouth daily.  furosemide 40 MG tablet Commonly known as:  LASIX Take 1 tablet (40 mg total) by mouth daily. For 5 days then stop.   omeprazole 20 MG capsule Commonly known as:  PRILOSEC Take 20 mg by mouth daily.   potassium chloride SA 20 MEQ tablet Commonly known as:  K-DUR,KLOR-CON Take 1 tablet (20 mEq total) by mouth daily. For 5 days then stop.   traMADol 50 MG tablet Commonly known as:  ULTRAM Take 50 mg by mouth every 4-6 hours PRN moderate to severe pain.   vitamin B-12 1000 MCG tablet Commonly known as:  CYANOCOBALAMIN Take 1,000 mcg by mouth daily.            Discharge Care Instructions        Start     Ordered   02/18/17 0000  acetaminophen (TYLENOL) 325 MG tablet  Every 6 hours PRN     02/18/17 0759   02/18/17 0000  aspirin EC 325 MG EC tablet  Daily     02/18/17 0759   02/18/17 0000  potassium chloride SA (K-DUR,KLOR-CON) 20 MEQ tablet  Daily    Question:  Supervising Provider  Answer:  [name]   02/18/17 0759   02/18/17 0000  traMADol  (ULTRAM) 50 MG tablet    Question:  Supervising Provider  Answer:  Gilford Raid K   02/18/17 0759   02/18/17 0000  cephALEXin (KEFLEX) 500 MG capsule  3 times daily    Question:  Supervising Provider  Answer:  [place] Raid K   02/18/17 0805   02/18/17 0000  amiodarone (PACERONE) 200 MG tablet  2 times daily    Question:  Supervising Provider  Answer:  [place] Raid K   02/18/17 0810   02/18/17 0000  furosemide (LASIX) 40 MG tablet  Daily    Question:  Supervising Provider  Answer:  [name]   02/18/17 0810     The patient has been discharged on:   1.Beta Blocker:  Yes [   ]                              No   [  x ]                              If No, reason:Bradycardia  2.Ace Inhibitor/ARB: Yes [   ]                                     No  [  x  ]                                     If No, reason:Labile BP  3.Statin:   Yes [ x  ]                  No  [   ]                  If No, reason:  4.Ecasa:  Yes  [  x ]                  No   [   ]  If No, reason:  Follow Up Appointments: Follow-up Information    [name], MD Follow up on 02/18/2017.   Specialty:  Cardiothoracic Surgery Why:  PA/LAT CXR to be taken (at [facility] which is in the same building as Dr. Vivi Martens office) one hour prior to office appointment. Office will call or mail appointment date and time. Contact information: [address] [place] [address] [phone]        Nurse Follow up on 02/26/2017.   Why:  Appointment is for chest tube suture removal only. Office will call with appointment time. Contact information: [address] [place] [address]       Yolonda Kida, MD. Call.   Specialties:  Cardiology, Internal Medicine Why:  Call for a follow up appointment for 2 weeks Contact information: [place] [address] [phone]            Signed: Lars Pinks MPA-C 02/18/2017, 12:42 PM

## 2017-02-18 NOTE — Care Management Note (Signed)
Case Management Note Marvetta Gibbons RN, BSN Unit 4E-Case Manager-- Welcome coverage [phone]  Patient Details  Name: Richard Rivas MRN: [id number] Date of Birth: 12-Sep-1940  Subjective/Objective:   Pt tx from [facility]- with High grade left main and multi-vessel CAD-- plan for CABG today                 Action/Plan: PTA pt lived at home with spouse-  CM to follow post op for d/c needs  Expected Discharge Date:  02/18/17               Expected Discharge Plan:  Home/Self Care  In-House Referral:  [facility]  Discharge planning Services  CM Consult  Post Acute Care Choice:  NA Choice offered to:  NA  DME Arranged:    DME Agency:     HH Arranged:    [place] Agency:     Status of Service:  Completed, signed off  If discussed at [place] of Stay Meetings, dates discussed:    Discharge Disposition: home/self care   Additional Comments:  02/18/17- 1300- Marvetta Gibbons RN, CM- pt for d/c home today- no CM needs noted for discharge.  Dawayne Patricia, RN 02/18/2017, 2:48 PM

## 2017-02-18 NOTE — Progress Notes (Signed)
Epicardial pacing wires discontinued as per orders.

## 2017-02-18 NOTE — Discharge Instructions (Signed)

## 2017-02-19 ENCOUNTER — Other Ambulatory Visit: Payer: Self-pay

## 2017-02-19 NOTE — Consult Note (Signed)
Patient contacted for a follow up call regarding post hospital needs.  HIPAA provided and the patient states he is doing well and had no needs.  He states his primary care provider Dr. Myrtis Ser office had called and he has an appointment  With his cardiologist next week.  He denies any trouble with medications, transportation, or needs at this time.  He confirms his medical coverage is with [employer] plan.  No needs voiced. Will sign off.  For questions, please contact:  Natividad Brood, RN BSN [facility] Liaison  [phone] business mobile phone Toll free office [phone]

## 2017-02-26 ENCOUNTER — Other Ambulatory Visit: Payer: Self-pay | Admitting: *Deleted

## 2017-02-26 ENCOUNTER — Encounter (INDEPENDENT_AMBULATORY_CARE_PROVIDER_SITE_OTHER): Payer: Self-pay

## 2017-02-26 DIAGNOSIS — I2510 Atherosclerotic heart disease of native coronary artery without angina pectoris: Secondary | ICD-10-CM

## 2017-02-26 MED ORDER — ATORVASTATIN CALCIUM 80 MG PO TABS: 80.0000 mg | ORAL_TABLET | Freq: Every day | ORAL | 1 refills | Status: DC

## 2017-03-20 ENCOUNTER — Other Ambulatory Visit: Payer: Self-pay | Admitting: Surgery

## 2017-03-20 DIAGNOSIS — I25119 Atherosclerotic heart disease of native coronary artery with unspecified angina pectoris: Secondary | ICD-10-CM

## 2017-03-23 ENCOUNTER — Ambulatory Visit (INDEPENDENT_AMBULATORY_CARE_PROVIDER_SITE_OTHER): Payer: Self-pay | Admitting: Physician Assistant

## 2017-03-23 ENCOUNTER — Ambulatory Visit
Admission: RE | Admit: 2017-03-23 | Discharge: 2017-03-23 | Disposition: A | Discharge status: Home / Self Care | Payer: PPO | Source: Ambulatory Visit | Attending: Surgery | Admitting: Surgery

## 2017-03-23 VITALS — BP 114/66 | HR 60 | Ht 68.0 in | Wt 163.0 lb

## 2017-03-23 DIAGNOSIS — Z951 Presence of aortocoronary bypass graft: Secondary | ICD-10-CM

## 2017-03-23 DIAGNOSIS — I25119 Atherosclerotic heart disease of native coronary artery with unspecified angina pectoris: Secondary | ICD-10-CM

## 2017-03-23 DIAGNOSIS — I2510 Atherosclerotic heart disease of native coronary artery without angina pectoris: Secondary | ICD-10-CM

## 2017-03-23 DIAGNOSIS — I10 Essential (primary) hypertension: Secondary | ICD-10-CM | POA: Diagnosis not present

## 2017-03-23 MED ORDER — ATORVASTATIN CALCIUM 80 MG PO TABS: 80.0000 mg | ORAL_TABLET | Freq: Every day | ORAL | 1 refills | Status: DC

## 2017-03-23 NOTE — Progress Notes (Signed)
HPI:  Patient returns for routine postoperative follow-up having undergone CABG x 3  on 02/13/2017 The patient's early postoperative recovery while in the hospital was notable for Atrial Fibrillation which converted to NSR with Amiodarone.  He also was treated for phlebitis of his right arm.  Since hospital discharge the patient reports he is doing very well.  He states he feels he has progressed without difficulty since hospital discharge.  He denies chest pain, shortness of breath, and any further palpitations.  He is ambulating independently and without difficulty.  He states his incisions have healed well.  He has resumed driving short distances.  He has not yet been seen by Cardiology.  However they wanted to establish care with Dr. [place] Anon as they have friends who see him and like him.  He is set up to see him on 05/04/2017.  Current Outpatient Prescriptions  Medication Sig Dispense Refill  . acetaminophen (TYLENOL) 325 MG tablet Take 2 tablets (650 mg total) by mouth every 6 (six) hours as needed for mild pain.    Marland Kitchen aspirin EC 325 MG EC tablet Take 1 tablet (325 mg total) by mouth daily. 30 tablet 0  . atorvastatin (LIPITOR) 80 MG tablet Take 1 tablet (80 mg total) by mouth daily at 6 PM. 30 tablet 1  . FLUoxetine (PROZAC) 20 MG capsule Take 20 mg by mouth daily.    . traMADol (ULTRAM) 50 MG tablet Take 50 mg by mouth every 4-6 hours PRN moderate to severe pain. 30 tablet 0  . vitamin B-12 (CYANOCOBALAMIN) 1000 MCG tablet Take 1,000 mcg by mouth daily.     No current facility-administered medications for this visit.     Physical Exam:  BP 114/66   Pulse 60   Ht 5\' 8"  (1.727 m)   Wt 163 lb (73.9 kg)   SpO2 98%   BMI 24.78 kg/m   Gen: no apparent distress Heart: RRR Lungs: CTA bilaterally Abd: soft non-tender, non-distended Ext: no edema present Incisions: well healed  Diagnostic Tests:  CXR: no pneumothorax, pleural effusions, sternal wires intact  A/P:  1.  CV-hemodynamically stable, in NSR-- HR is 60, BP well controlled- will stop Amiodarone at this time.   HR is low today, will not start patient on low dose BB at this time, may be able to in future if HR allows 2. Patient give refill for Lipitor as he will run out prior to appointment with Cardiology office 3. Activity-instructed to continue to increase ambulation as tolerated.  Gave instructions on increasing physical activity with limitations until sternum has healed.  Re-iterated okay to drive as long as not taking narcotic pain medications 4. RTC prn   Ellwood Handler, PA-C [facility] [phone]

## 2017-03-23 NOTE — Patient Instructions (Addendum)
You may return to driving an automobile as long as you are no longer requiring oral narcotic pain relievers during the daytime.  It would be wise to start driving only short distances during the daylight and gradually increase from there as you feel comfortable.   Make every effort to maintain a "heart-healthy" lifestyle with regular physical exercise and adherence to a low-fat, low-carbohydrate diet.  Continue to seek regular follow-up appointments with your primary care physician and/or cardiologist.   You may continue to gradually increase your physical activity as tolerated.  Refrain from any heavy lifting or strenuous use of your arms and shoulders until at least 8 weeks from the time of your surgery, and avoid activities that cause increased pain in your chest on the side of your surgical incision.  Otherwise you may continue to increase activities without any particular limitations.  Increase the intensity and duration of physical activity gradually.    Fat and Cholesterol Restricted Diet Getting too much fat and cholesterol in your diet may cause health problems. Following this diet helps keep your fat and cholesterol at normal levels. This can keep you from getting sick. What types of fat should I choose?  Choose monosaturated and polyunsaturated fats. These are found in foods such as olive oil, canola oil, flaxseeds, walnuts, almonds, and seeds.  Eat more omega-3 fats. Good choices include salmon, mackerel, sardines, tuna, flaxseed oil, and ground flaxseeds.  Limit saturated fats. These are in animal products such as meats, butter, and cream. They can also be in plant products such as palm oil, palm kernel oil, and coconut oil.  Avoid foods with partially hydrogenated oils in them. These contain trans fats. Examples of foods that have trans fats are stick margarine, some tub margarines, cookies, crackers, and other baked goods. What general guidelines do I need to follow?  Check food  labels. Look for the words "trans fat" and "saturated fat."  When preparing a meal: ? Fill half of your plate with vegetables and green salads. ? Fill one fourth of your plate with whole grains. Look for the word "whole" as the first word in the ingredient list. ? Fill one fourth of your plate with lean protein foods.  Eat more foods that have fiber, like apples, carrots, beans, peas, and barley.  Eat more home-cooked foods. Eat less at restaurants and buffets.  Limit or avoid alcohol.  Limit foods high in starch and sugar.  Limit fried foods.  Cook foods without frying them. Baking, boiling, grilling, and broiling are all great options.  Lose weight if you are overweight. Losing even a small amount of weight can help your overall health. It can also help prevent diseases such as diabetes and heart disease. What foods can I eat? Grains Whole grains, such as whole wheat or whole grain breads, crackers, cereals, and pasta. Unsweetened oatmeal, bulgur, barley, quinoa, or brown rice. Corn or whole wheat flour tortillas. Vegetables Fresh or frozen vegetables (raw, steamed, roasted, or grilled). Green salads. Fruits All fresh, canned (in natural juice), or frozen fruits. Meat and Other Protein Products Ground beef (85% or leaner), grass-fed beef, or beef trimmed of fat. Skinless chicken or [place]. Ground chicken or [place]. Pork trimmed of fat. All fish and seafood. Eggs. Dried beans, peas, or lentils. Unsalted nuts or seeds. Unsalted canned or dry beans. Dairy Low-fat dairy products, such as skim or 1% milk, 2% or reduced-fat cheeses, low-fat ricotta or cottage cheese, or plain low-fat yogurt. Fats and Oils Tub margarines without  trans fats. Light or reduced-fat mayonnaise and salad dressings. Avocado. Olive, canola, sesame, or safflower oils. Natural peanut or almond butter (choose ones without added sugar and oil). The items listed above may not be a complete list of recommended foods or  beverages. Contact your dietitian for more options. What foods are not recommended? Grains White bread. White pasta. White rice. Cornbread. Bagels, pastries, and croissants. Crackers that contain trans fat. Vegetables White potatoes. Corn. Creamed or fried vegetables. Vegetables in a cheese sauce. Fruits Dried fruits. Canned fruit in light or heavy syrup. Fruit juice. Meat and Other Protein Products Fatty cuts of meat. Ribs, chicken wings, bacon, sausage, bologna, salami, chitterlings, fatback, hot dogs, bratwurst, and packaged luncheon meats. Liver and organ meats. Dairy Whole or 2% milk, cream, half-and-half, and cream cheese. Whole milk cheeses. Whole-fat or sweetened yogurt. Full-fat cheeses. Nondairy creamers and whipped toppings. Processed cheese, cheese spreads, or cheese curds. Sweets and Desserts Corn syrup, sugars, honey, and molasses. Candy. Jam and jelly. Syrup. Sweetened cereals. Cookies, pies, cakes, donuts, muffins, and ice cream. Fats and Oils Butter, stick margarine, lard, shortening, ghee, or bacon fat. Coconut, palm kernel, or palm oils. Beverages Alcohol. Sweetened drinks (such as sodas, lemonade, and fruit drinks or punches). The items listed above may not be a complete list of foods and beverages to avoid. Contact your dietitian for more information. This information is not intended to replace advice given to you by your health care provider. Make sure you discuss any questions you have with your health care provider. Document Released: 11/18/2011 Document Revised: 01/24/2016 Document Reviewed: 08/18/2013 Elsevier Interactive Patient Education  [employer].

## 2017-04-16 ENCOUNTER — Other Ambulatory Visit: Payer: Self-pay

## 2017-04-16 DIAGNOSIS — I2510 Atherosclerotic heart disease of native coronary artery without angina pectoris: Secondary | ICD-10-CM

## 2017-04-17 MED ORDER — ATORVASTATIN CALCIUM 80 MG PO TABS: 80.0000 mg | ORAL_TABLET | Freq: Every day | ORAL | 3 refills | Status: DC

## 2017-04-20 ENCOUNTER — Other Ambulatory Visit: Payer: Self-pay

## 2017-04-20 DIAGNOSIS — I2510 Atherosclerotic heart disease of native coronary artery without angina pectoris: Secondary | ICD-10-CM

## 2017-04-20 MED ORDER — ATORVASTATIN CALCIUM 80 MG PO TABS: 80.0000 mg | ORAL_TABLET | Freq: Every day | ORAL | 3 refills | Status: DC

## 2017-05-04 ENCOUNTER — Ambulatory Visit (INDEPENDENT_AMBULATORY_CARE_PROVIDER_SITE_OTHER): Payer: PPO | Admitting: Cardiovascular Disease

## 2017-05-04 ENCOUNTER — Encounter: Payer: Self-pay | Admitting: Cardiovascular Disease

## 2017-05-04 VITALS — BP 114/64 | HR 67 | Ht 68.5 in | Wt 171.8 lb

## 2017-05-04 DIAGNOSIS — I2510 Atherosclerotic heart disease of native coronary artery without angina pectoris: Secondary | ICD-10-CM | POA: Diagnosis not present

## 2017-05-04 DIAGNOSIS — Z7689 Persons encountering health services in other specified circumstances: Secondary | ICD-10-CM

## 2017-05-04 DIAGNOSIS — E7800 Pure hypercholesterolemia, unspecified: Secondary | ICD-10-CM

## 2017-05-04 DIAGNOSIS — E785 Hyperlipidemia, unspecified: Secondary | ICD-10-CM

## 2017-05-04 MED ORDER — ROSUVASTATIN CALCIUM 20 MG PO TABS: 20.0000 mg | ORAL_TABLET | Freq: Every day | ORAL | 3 refills | Status: DC

## 2017-05-04 NOTE — Patient Instructions (Addendum)
Medication Instructions:  Your physician has recommended you make the following change in your medication:  STOP taking atorvastatin START taking rosuvastatin 20mg  once daily   Labwork: At PCP  Testing/Procedures: none  Follow-Up: Your physician recommends that you schedule a follow-up appointment in: 4 months with Dr. Fletcher Anon.    Any Other Special Instructions Will Be Listed Below (If Applicable).     If you need a refill on your cardiac medications before your next appointment, please call your pharmacy.   Heart-Healthy Eating Plan Heart-healthy meal planning includes:  Limiting unhealthy fats.  Increasing healthy fats.  Making other small dietary changes.  You may need to talk with your doctor or a diet specialist (dietitian) to create an eating plan that is right for you. What types of fat should I choose?  Choose healthy fats. These include olive oil and canola oil, flaxseeds, walnuts, almonds, and seeds.  Eat more omega-3 fats. These include salmon, mackerel, sardines, tuna, flaxseed oil, and ground flaxseeds. Try to eat fish at least twice each week.  Limit saturated fats. ? Saturated fats are often found in animal products, such as meats, butter, and cream. ? Plant sources of saturated fats include palm oil, palm kernel oil, and coconut oil.  Avoid foods with partially hydrogenated oils in them. These include stick margarine, some tub margarines, cookies, crackers, and other baked goods. These contain trans fats. What general guidelines do I need to follow?  Check food labels carefully. Identify foods with trans fats or high amounts of saturated fat.  Fill one half of your plate with vegetables and green salads. Eat 4-5 servings of vegetables per day. A serving of vegetables is: ? 1 cup of raw leafy vegetables. ?  cup of raw or cooked cut-up vegetables. ?  cup of vegetable juice.  Fill one fourth of your plate with whole grains. Look for the word "whole"  as the first word in the ingredient list.  Fill one fourth of your plate with lean protein foods.  Eat 4-5 servings of fruit per day. A serving of fruit is: ? One medium whole fruit. ?  cup of dried fruit. ?  cup of fresh, frozen, or canned fruit. ?  cup of 100% fruit juice.  Eat more foods that contain soluble fiber. These include apples, broccoli, carrots, beans, peas, and barley. Try to get 20-30 g of fiber per day.  Eat more home-cooked food. Eat less restaurant, buffet, and fast food.  Limit or avoid alcohol.  Limit foods high in starch and sugar.  Avoid fried foods.  Avoid frying your food. Try baking, boiling, grilling, or broiling it instead. You can also reduce fat by: ? Removing the skin from poultry. ? Removing all visible fats from meats. ? Skimming the fat off of stews, soups, and gravies before serving them. ? Steaming vegetables in water or broth.  Lose weight if you are overweight.  Eat 4-5 servings of nuts, legumes, and seeds per week: ? One serving of dried beans or legumes equals  cup after being cooked. ? One serving of nuts equals 1 ounces. ? One serving of seeds equals  ounce or one tablespoon.  You may need to keep track of how much salt or sodium you eat. This is especially true if you have high blood pressure. Talk with your doctor or dietitian to get more information. What foods can I eat? Grains Breads, including [place], white, pita, wheat, raisin, rye, oatmeal, and [place]. Tortillas that are neither fried nor  made with lard or trans fat. Low-fat rolls, including hotdog and hamburger buns and English muffins. Biscuits. Muffins. Waffles. Pancakes. Light popcorn. Whole-grain cereals. Flatbread. Melba toast. Pretzels. Breadsticks. Rusks. Low-fat snacks. Low-fat crackers, including oyster, saltine, matzo, graham, animal, and rye. Rice and pasta, including brown rice and pastas that are made with whole wheat. Vegetables All vegetables. Fruits All  fruits, but limit coconut. Meats and Other Protein Sources Lean, well-trimmed beef, veal, pork, and lamb. Chicken and [place] without skin. All fish and shellfish. Wild duck, rabbit, pheasant, and venison. Egg whites or low-cholesterol egg substitutes. Dried beans, peas, lentils, and tofu. Seeds and most nuts. Dairy Low-fat or nonfat cheeses, including ricotta, string, and mozzarella. Skim or 1% milk that is liquid, powdered, or evaporated. Buttermilk that is made with low-fat milk. Nonfat or low-fat yogurt. Beverages Mineral water. Diet carbonated beverages. Sweets and Desserts Sherbets and fruit ices. Honey, jam, marmalade, jelly, and syrups. Meringues and gelatins. Pure sugar candy, such as hard candy, jelly beans, gumdrops, mints, marshmallows, and small amounts of dark chocolate. [employer] Eat all sweets and desserts in moderation. Fats and Oils Nonhydrogenated (trans-free) margarines. Vegetable oils, including soybean, sesame, sunflower, olive, peanut, safflower, corn, canola, and cottonseed. Salad dressings or mayonnaise made with a vegetable oil. Limit added fats and oils that you use for cooking, baking, salads, and as spreads. Other Cocoa powder. Coffee and tea. All seasonings and condiments. The items listed above may not be a complete list of recommended foods or beverages. Contact your dietitian for more options. What foods are not recommended? Grains Breads that are made with saturated or trans fats, oils, or whole milk. Croissants. Butter rolls. Cheese breads. Sweet rolls. Donuts. Buttered popcorn. Chow mein noodles. High-fat crackers, such as cheese or butter crackers. Meats and Other Protein Sources Fatty meats, such as hotdogs, short ribs, sausage, spareribs, bacon, rib eye roast or steak, and mutton. High-fat deli meats, such as salami and bologna. Caviar. Domestic duck and goose. Organ meats, such as kidney, liver, sweetbreads, and heart. Dairy Cream, sour cream, cream  cheese, and creamed cottage cheese. Whole-milk cheeses, including blue (bleu), Monterey Jack, [place], [place], American, [place], Swiss, cheddar, [place], and [place]. Whole or 2% milk that is liquid, evaporated, or condensed. Whole buttermilk. Cream sauce or high-fat cheese sauce. Yogurt that is made from whole milk. Beverages Regular sodas and juice drinks with added sugar. Sweets and Desserts Frosting. Pudding. Cookies. Cakes other than angel food cake. Candy that has milk chocolate or white chocolate, hydrogenated fat, butter, coconut, or unknown ingredients. Buttered syrups. Full-fat ice cream or ice cream drinks. Fats and Oils Gravy that has suet, meat fat, or shortening. Cocoa butter, hydrogenated oils, palm oil, coconut oil, palm kernel oil. These can often be found in baked products, candy, fried foods, nondairy creamers, and whipped toppings. Solid fats and shortenings, including bacon fat, salt pork, lard, and butter. Nondairy cream substitutes, such as coffee creamers and sour cream substitutes. Salad dressings that are made of unknown oils, cheese, or sour cream. The items listed above may not be a complete list of foods and beverages to avoid. Contact your dietitian for more information. This information is not intended to replace advice given to you by your health care provider. Make sure you discuss any questions you have with your health care provider. Document Released: 11/18/2011 Document Revised: 10/25/2015 Document Reviewed: 11/10/2013 Elsevier Interactive Patient Education  [employer].

## 2017-05-04 NOTE — Progress Notes (Signed)
Cardiology Office Note   Date:  05/04/2017   ID:  Richard Rivas, DOB May 22, 1941, MRN [id number]  PCP:  Rusty Aus, MD  Cardiologist:   Kathlyn Sacramento, MD   Chief Complaint  Patient presents with  . OTHER    CABG c/o rash on back. Meds reviewed verbally with pt.      History of Present Illness: Richard Rivas is a 76 y.o. male who presents to establish cardiovascular care.  The patient has known history of prior tobacco use and family history of coronary artery disease.  He had a small non-ST elevation myocardial infarction in September 2018.  Cardiac catheterization showed severe distal left main stenosis into the ostium of the LAD and left circumflex.  There was mild RCA disease.  Ejection fraction was normal.  The patient underwent CABG in September with LIMA to LAD, SVG to left circumflex and SVG to PDA. He has been doing well with no chest pain, shortness of breath or palpitations.  He wants to exercise on his own. He noticed a rash on his back since he was discharged.  The only new medication was atorvastatin.    Past Medical History:  Diagnosis Date  . BPPV (benign paroxysmal positional vertigo)   . Colon polyps   . Coronary artery disease   . Depression   . GERD (gastroesophageal reflux disease)   . History of kidney stones   . Hypertension   . Kidney stones   . Myocardial infarction [facility])     Past Surgical History:  Procedure Laterality Date  . CARDIAC CATHETERIZATION    . CERVICAL DISC SURGERY    . COLON SURGERY    . COLONOSCOPY WITH PROPOFOL N/A 10/11/2015   Procedure: COLONOSCOPY WITH PROPOFOL;  Surgeon: Manya Silvas, MD;  Location: [facility] ENDOSCOPY;  Service: Endoscopy;  Laterality: N/A;  . CORONARY ARTERY BYPASS GRAFT N/A 02/13/2017   Procedure: CORONARY ARTERY BYPASS GRAFTING (CABG) x3 (LIMA to LAD, SVG to CIRCUMFLEX , SVG to PDA )  , using left internal mammary artery and right leg greater saphenous vein harvested endoscopically;  Surgeon: [name], MD;  Location: [place];  Service: Open Heart Surgery;  Laterality: N/A;  . KIDNEY STONE SURGERY    . LAPAROSCOPIC RIGHT COLECTOMY Right 11/14/2014   Procedure: LAPAROSCOPIC RIGHT COLECTOMY;  Surgeon: Leonie Green, MD;  Location: [facility] ORS;  Service: General;  Laterality: Right;  . LEFT HEART CATH AND CORONARY ANGIOGRAPHY N/A 02/12/2017   Procedure: LEFT HEART CATH AND CORONARY ANGIOGRAPHY and possible pci;  Surgeon: Yolonda Kida, MD;  Location: [place] CV LAB;  Service: Cardiovascular;  Laterality: N/A;  . SHOULDER ARTHROSCOPY W/ ROTATOR CUFF REPAIR Right   . TEE WITHOUT CARDIOVERSION N/A 02/13/2017   Procedure: TRANSESOPHAGEAL ECHOCARDIOGRAM (TEE);  Surgeon: [name], MD;  Location: [place];  Service: Open Heart Surgery;  Laterality: N/A;     Current Outpatient Medications  Medication Sig Dispense Refill  . acetaminophen (TYLENOL) 325 MG tablet Take 2 tablets (650 mg total) by mouth every 6 (six) hours as needed for mild pain.    Marland Kitchen aspirin EC 325 MG EC tablet Take 1 tablet (325 mg total) by mouth daily. 30 tablet 0  . atorvastatin (LIPITOR) 80 MG tablet Take 1 tablet (80 mg total) daily by mouth. 90 tablet 3  . FLUoxetine (PROZAC) 20 MG capsule Take 20 mg by mouth daily.    . vitamin B-12 (CYANOCOBALAMIN) 1000 MCG tablet Take 1,000 mcg  by mouth daily.     No current facility-administered medications for this visit.     Allergies:   Bee venom    Social History:  The patient  reports that he quit smoking about 22 years ago. His smoking use included cigarettes. He quit after 40.00 years of use. He quit smokeless tobacco use about 4 years ago. He reports that he drinks alcohol. He reports that he does not use drugs.   Family History:  The patient's family history includes Heart attack in his brother and father.    ROS:  Please see the history of present illness.   Otherwise, review of systems are positive for none.   All other systems are reviewed and negative.     PHYSICAL EXAM: VS:  BP 114/64 (BP Location: Right Arm, Patient Position: Sitting, Cuff Size: Normal)   Pulse 67   Ht 5' 8.5" (1.74 m)   Wt 171 lb 12 oz (77.9 kg)   BMI 25.73 kg/m  , BMI Body mass index is 25.73 kg/m. GEN: Well nourished, well developed, in no acute distress  HEENT: normal  Neck: no JVD, carotid bruits, or masses Cardiac: RRR; no murmurs, rubs, or gallops,no edema  Respiratory:  clear to auscultation bilaterally, normal work of breathing GI: soft, nontender, nondistended, + BS MS: no deformity or atrophy  Skin: warm and dry.  Mild rash on the back Neuro:  Strength and sensation are intact Psych: euthymic mood, full affect   EKG:  EKG is ordered today. The ekg ordered today demonstrates normal sinus rhythm with no sick ST or T wave changes.   Recent Labs: 02/11/2017: TSH 1.179 02/14/2017: Magnesium 2.6 02/16/2017: BUN 24; Creatinine, Ser 0.87; Hemoglobin 9.2; Platelets 108; Potassium 3.9; Sodium 136    Lipid Panel    Component Value Date/Time   CHOL 150 02/12/2017 0527   TRIG 102 02/12/2017 0527   HDL 43 02/12/2017 0527   CHOLHDL 3.5 02/12/2017 0527   VLDL 20 02/12/2017 0527   LDLCALC 87 02/12/2017 0527      Wt Readings from Last 3 Encounters:  05/04/17 171 lb 12 oz (77.9 kg)  03/23/17 163 lb (73.9 kg)  02/18/17 176 lb 3.2 oz (79.9 kg)      PAD Screen 05/04/2017  Previous PAD dx? No  Previous surgical procedure? No  Pain with walking? No  Feet/toe relief with dangling? No  Painful, non-healing ulcers? No  Extremities discolored? No      ASSESSMENT AND PLAN:  1.  Coronary artery disease involving native coronary arteries without angina: He is doing very well after CABG in September 2018.  He wants to exercise on his own. Continue aspirin daily.  I discussed with him the importance of healthy diet and exercise.  2.  Hyperlipidemia: The rash on his back is likely due to atorvastatin given that this is the only new medication.  I elected  to switch him to rosuvastatin 20 mg daily.  He is going to have labs done with Dr. Sabra Heck.    Disposition:   FU with me in 4 months  Signed,  Kathlyn Sacramento, MD  05/04/2017 1:48 PM    [place]

## 2017-05-06 DIAGNOSIS — I25810 Atherosclerosis of coronary artery bypass graft(s) without angina pectoris: Secondary | ICD-10-CM | POA: Diagnosis not present

## 2017-05-06 DIAGNOSIS — I700 Atherosclerosis of aorta: Secondary | ICD-10-CM | POA: Diagnosis not present

## 2017-05-06 DIAGNOSIS — Z951 Presence of aortocoronary bypass graft: Secondary | ICD-10-CM | POA: Insufficient documentation

## 2017-05-06 DIAGNOSIS — E538 Deficiency of other specified B group vitamins: Secondary | ICD-10-CM | POA: Diagnosis not present

## 2017-06-23 DIAGNOSIS — H40053 Ocular hypertension, bilateral: Secondary | ICD-10-CM | POA: Diagnosis not present

## 2017-06-30 DIAGNOSIS — N402 Nodular prostate without lower urinary tract symptoms: Secondary | ICD-10-CM | POA: Diagnosis not present

## 2017-07-06 DIAGNOSIS — R351 Nocturia: Secondary | ICD-10-CM | POA: Diagnosis not present

## 2017-07-06 DIAGNOSIS — N402 Nodular prostate without lower urinary tract symptoms: Secondary | ICD-10-CM | POA: Diagnosis not present

## 2017-07-06 DIAGNOSIS — N400 Enlarged prostate without lower urinary tract symptoms: Secondary | ICD-10-CM | POA: Diagnosis not present

## 2017-09-15 DIAGNOSIS — E538 Deficiency of other specified B group vitamins: Secondary | ICD-10-CM | POA: Diagnosis not present

## 2017-09-15 DIAGNOSIS — I700 Atherosclerosis of aorta: Secondary | ICD-10-CM | POA: Diagnosis not present

## 2017-09-15 DIAGNOSIS — Z125 Encounter for screening for malignant neoplasm of prostate: Secondary | ICD-10-CM | POA: Diagnosis not present

## 2017-09-23 DIAGNOSIS — L570 Actinic keratosis: Secondary | ICD-10-CM | POA: Diagnosis not present

## 2017-09-23 DIAGNOSIS — E538 Deficiency of other specified B group vitamins: Secondary | ICD-10-CM | POA: Diagnosis not present

## 2017-09-23 DIAGNOSIS — Z0000 Encounter for general adult medical examination without abnormal findings: Secondary | ICD-10-CM | POA: Diagnosis not present

## 2017-09-23 DIAGNOSIS — I25810 Atherosclerosis of coronary artery bypass graft(s) without angina pectoris: Secondary | ICD-10-CM | POA: Diagnosis not present

## 2018-03-22 DIAGNOSIS — E538 Deficiency of other specified B group vitamins: Secondary | ICD-10-CM | POA: Diagnosis not present

## 2018-03-22 DIAGNOSIS — I25810 Atherosclerosis of coronary artery bypass graft(s) without angina pectoris: Secondary | ICD-10-CM | POA: Diagnosis not present

## 2018-03-31 DIAGNOSIS — I25810 Atherosclerosis of coronary artery bypass graft(s) without angina pectoris: Secondary | ICD-10-CM | POA: Diagnosis not present

## 2018-03-31 DIAGNOSIS — Z79899 Other long term (current) drug therapy: Secondary | ICD-10-CM | POA: Diagnosis not present

## 2018-03-31 DIAGNOSIS — Z125 Encounter for screening for malignant neoplasm of prostate: Secondary | ICD-10-CM | POA: Diagnosis not present

## 2018-03-31 DIAGNOSIS — E538 Deficiency of other specified B group vitamins: Secondary | ICD-10-CM | POA: Diagnosis not present

## 2018-08-03 DIAGNOSIS — R1013 Epigastric pain: Secondary | ICD-10-CM | POA: Diagnosis not present

## 2018-09-23 DIAGNOSIS — Z125 Encounter for screening for malignant neoplasm of prostate: Secondary | ICD-10-CM | POA: Diagnosis not present

## 2018-09-23 DIAGNOSIS — E538 Deficiency of other specified B group vitamins: Secondary | ICD-10-CM | POA: Diagnosis not present

## 2018-09-23 DIAGNOSIS — I25810 Atherosclerosis of coronary artery bypass graft(s) without angina pectoris: Secondary | ICD-10-CM | POA: Diagnosis not present

## 2018-09-23 DIAGNOSIS — Z79899 Other long term (current) drug therapy: Secondary | ICD-10-CM | POA: Diagnosis not present

## 2018-09-28 DIAGNOSIS — N400 Enlarged prostate without lower urinary tract symptoms: Secondary | ICD-10-CM | POA: Diagnosis not present

## 2018-09-29 IMAGING — CR DG CHEST 2V
2 series · 2 of 2 positions shown · non-contrast
Comparison: None.

CLINICAL DATA: Intermittent central chest pain starting yesterday.
Diaphoresis.

EXAM:
CHEST  2 VIEW

[chest pa]
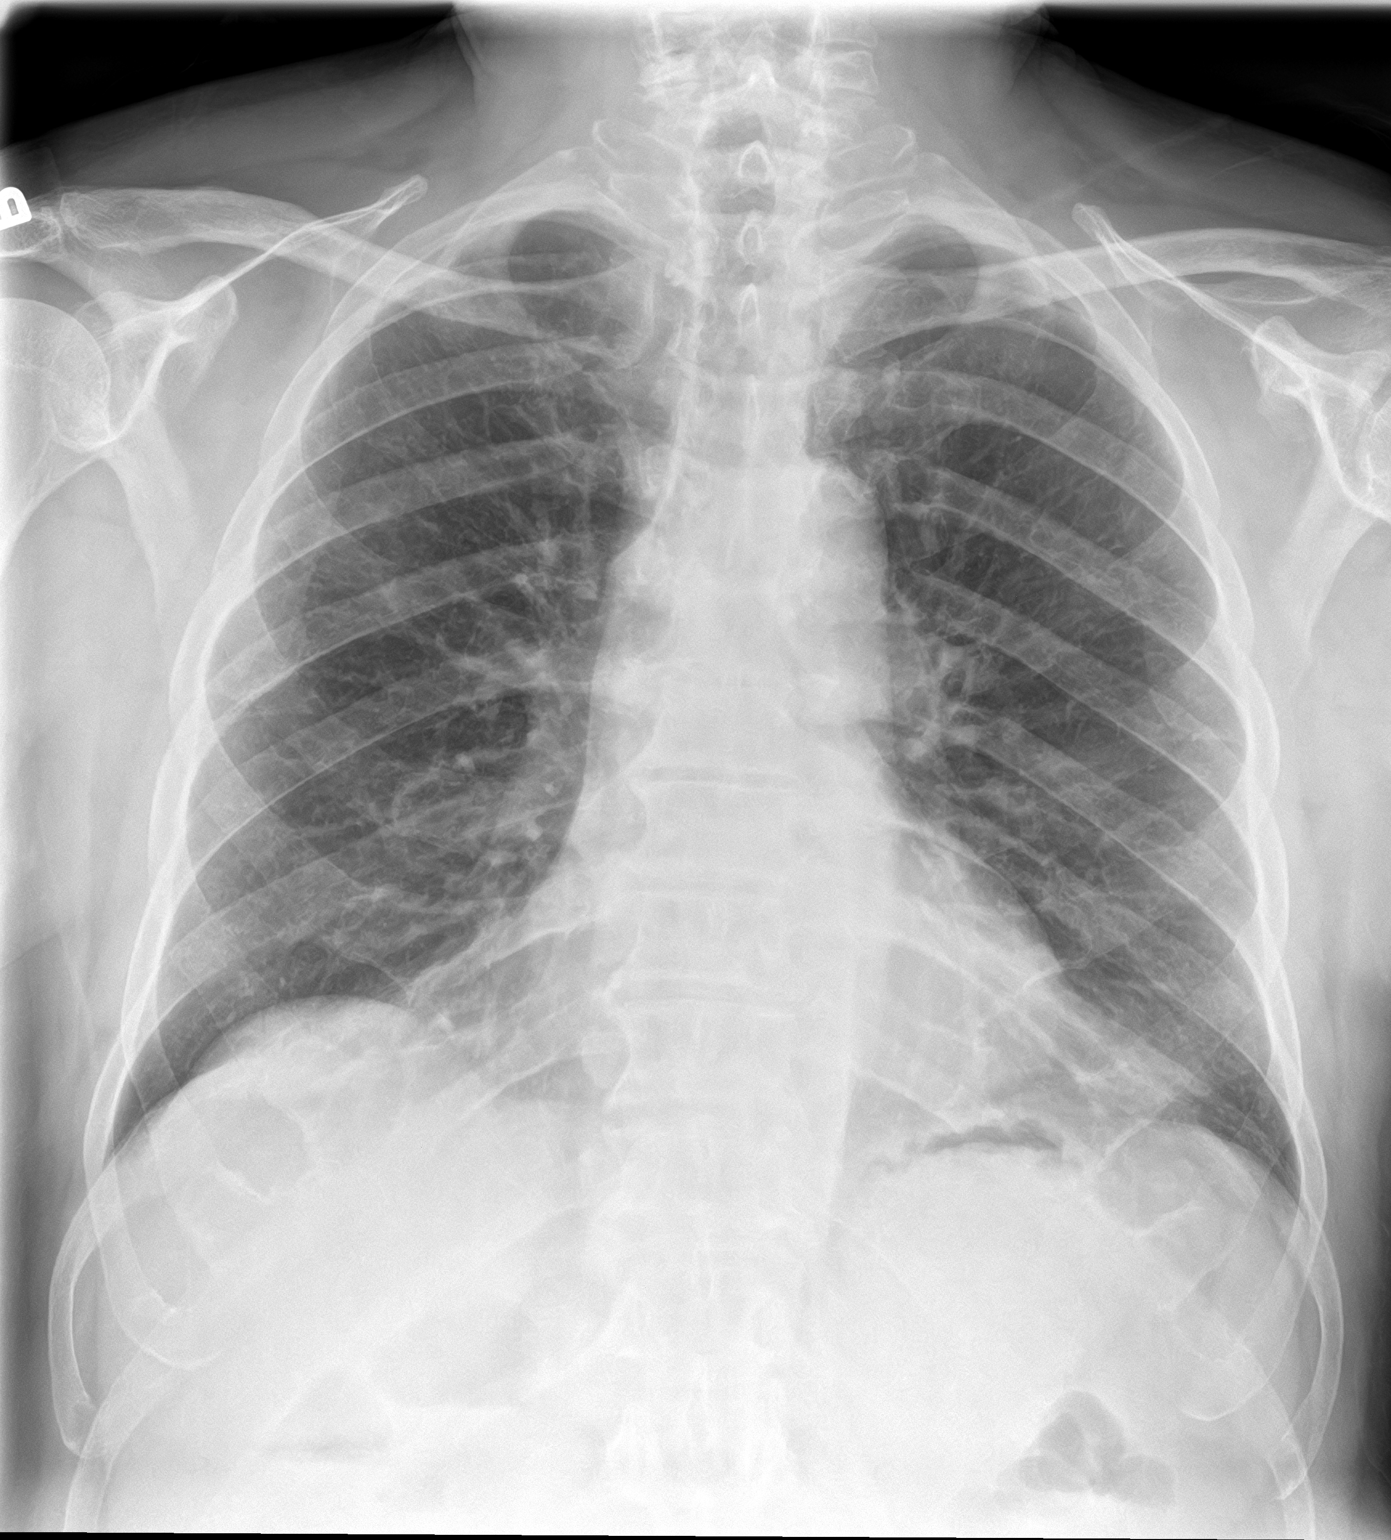

[chest lat]
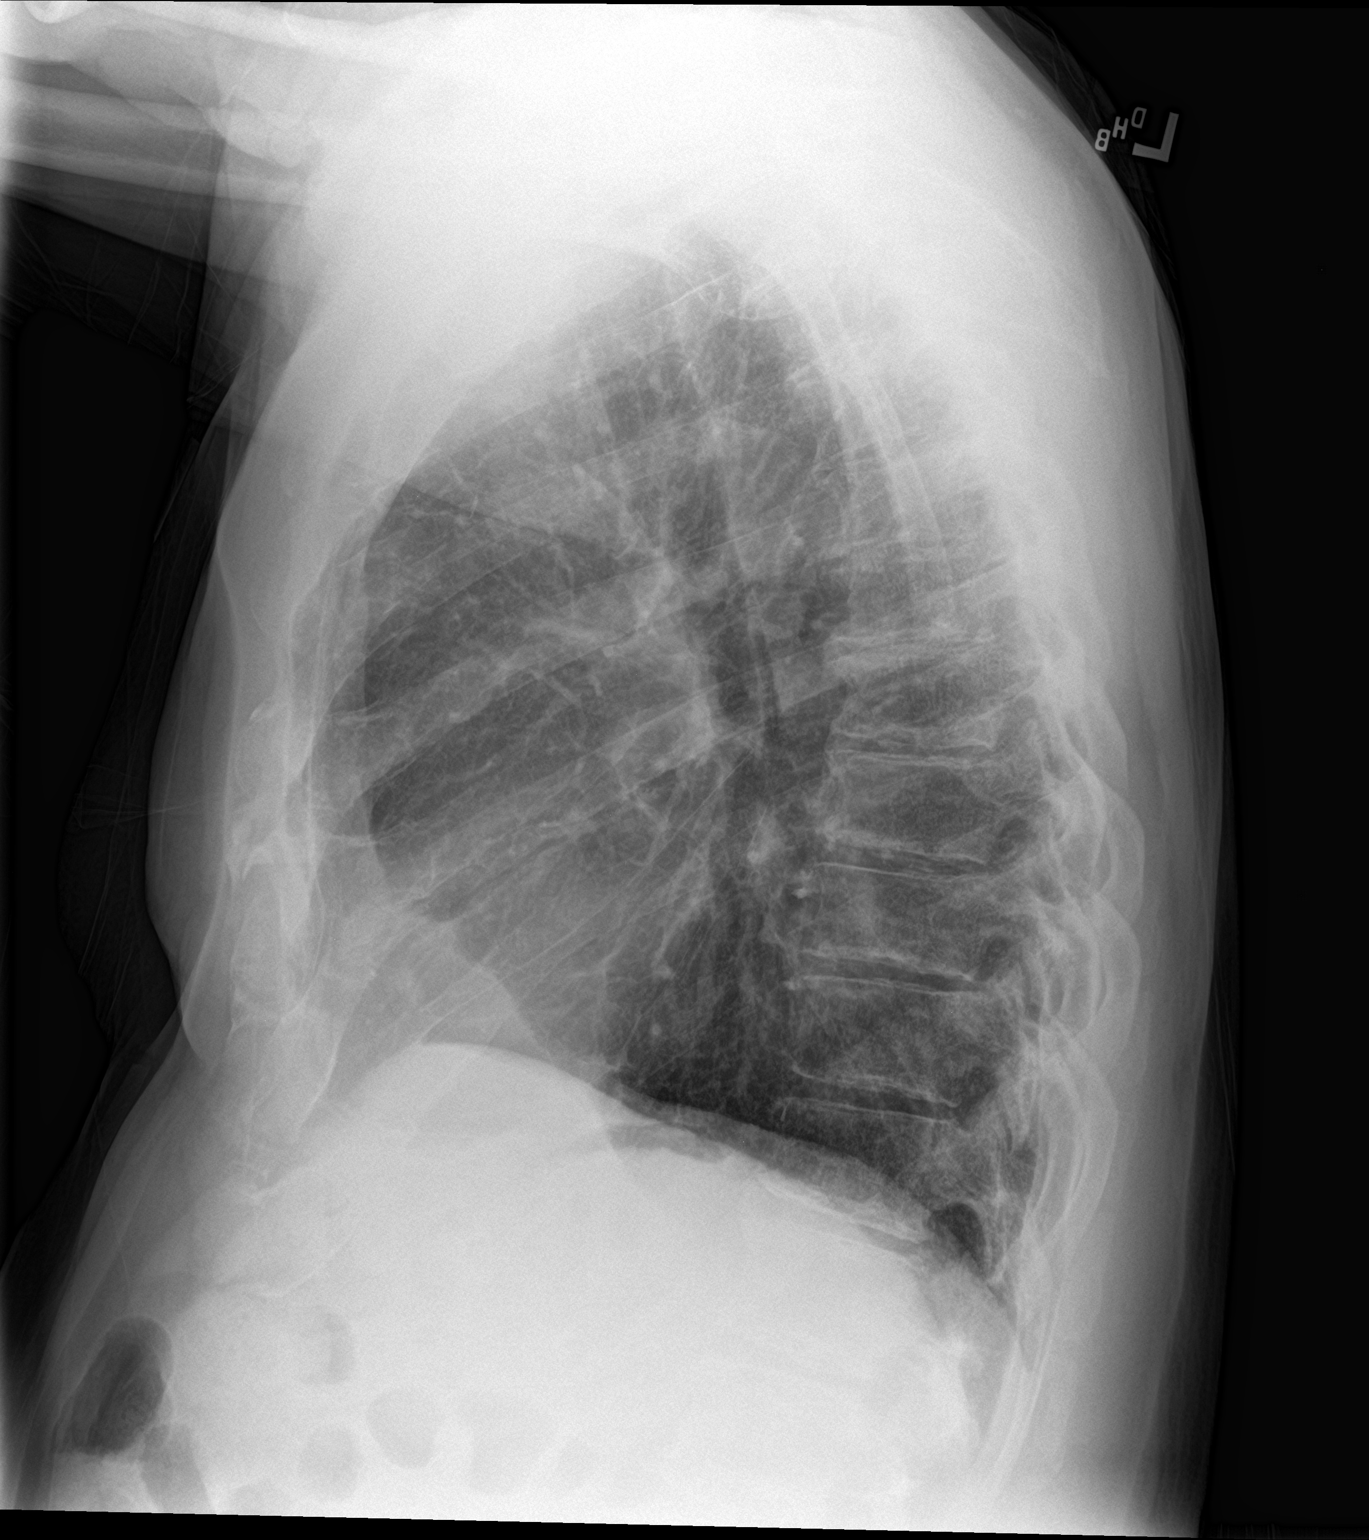

[2 of 2 positions shown; findings below may reference images not displayed]

FINDINGS: Cardiomediastinal silhouette is normal. Mildly increased lung
volumes without pleural effusion or focal consolidation. No
pneumothorax. Soft tissue planes and included osseous structures are
nonsuspicious. Mild degenerative change of the thoracic spine.
IMPRESSION: Hyperinflation without focal consolidation.

## 2018-09-30 ENCOUNTER — Telehealth: Payer: Self-pay | Admitting: *Deleted

## 2018-09-30 DIAGNOSIS — E538 Deficiency of other specified B group vitamins: Secondary | ICD-10-CM | POA: Diagnosis not present

## 2018-09-30 DIAGNOSIS — I700 Atherosclerosis of aorta: Secondary | ICD-10-CM | POA: Diagnosis not present

## 2018-09-30 DIAGNOSIS — E782 Mixed hyperlipidemia: Secondary | ICD-10-CM | POA: Diagnosis not present

## 2018-09-30 DIAGNOSIS — Z0000 Encounter for general adult medical examination without abnormal findings: Secondary | ICD-10-CM | POA: Diagnosis not present

## 2018-09-30 NOTE — Telephone Encounter (Signed)
Call placed to the patient's wife, per the dpr. The patient has not been seen in 2 years and they do not feel comfortable with a visit over the phone. They are unable to do a web visit.   The patient's appointment has been moved to 10/12/2018 at the [facility] office.   They have been advised to call back if anything is needed prior to the visit.      COVID-19 Pre-Screening Questions:  . Do you currently have a fever? No  . Have you recently travelled on a cruise, internationally, or to [place], [place], [place], [place], [place], or [place], [place] [employer]) ? No . Have you been in contact with someone that is currently pending confirmation of Covid19 testing or has been confirmed to have the [place] virus?  No  . Are you currently experiencing fatigue or cough? No

## 2018-10-01 ENCOUNTER — Ambulatory Visit: Payer: PPO | Admitting: Cardiovascular Disease

## 2018-10-01 IMAGING — DX DG CHEST 1V PORT
1 series · 1 of 1 positions shown · non-contrast
Comparison: February 11, 2017

CLINICAL DATA: Hypoxia

EXAM:
PORTABLE CHEST 1 VIEW

[chest ap]
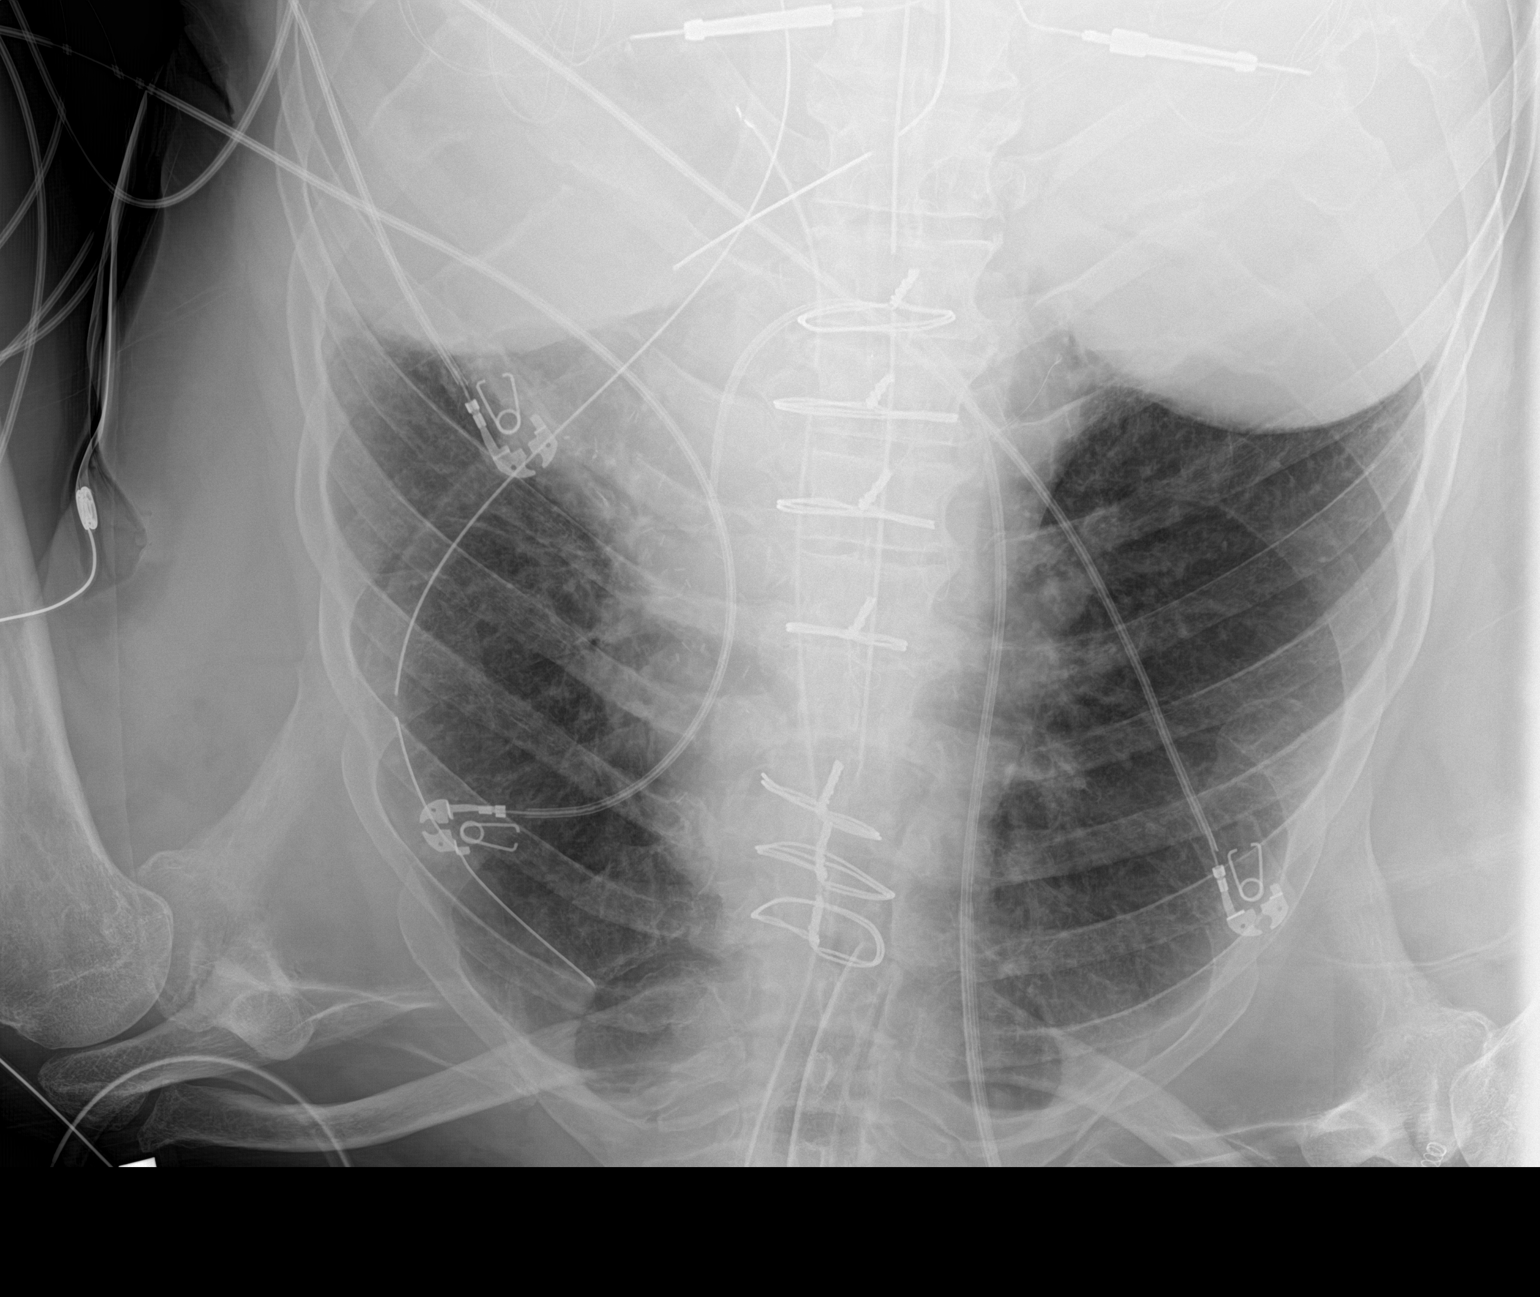

[1 of 1 positions shown; findings below may reference images not displayed]

FINDINGS: Endotracheal tube tip is 5.0 cm above the carina. Swan-Ganz catheter
tip is in the main pulmonary outflow tract near the pulmonic valve.
Nasogastric tube tip is in the proximal stomach with the side port
above the gastroesophageal junction. There is a the mediastinal
drain as well as two left chest tubes. Temporary pacemaker wires are
attached to the right heart. No pneumothorax.

There is mild left base atelectasis with minimal left pleural
effusion. Lungs elsewhere clear. Heart is upper normal in size with
pulmonary vascularity within normal limits. Patient is status post
coronary artery bypass grafting. No evident adenopathy.
IMPRESSION: Tube and catheter positions as described without evident
pneumothorax. Note that the nasogastric tube side port is above the
gastroesophageal junction. Advise advancing nasogastric tube
approximately 8 cm to insure that nasogastric tube tip and side port
are well within the stomach.

Rather minimal left pleural effusion with mild left base
atelectasis. Lungs elsewhere clear.

## 2018-10-03 IMAGING — DX DG CHEST 1V PORT
1 series · 1 of 1 positions shown · non-contrast
Comparison: 02/14/2017

CLINICAL DATA: Followup CABG

EXAM:
PORTABLE CHEST 1 VIEW

[chest ap]
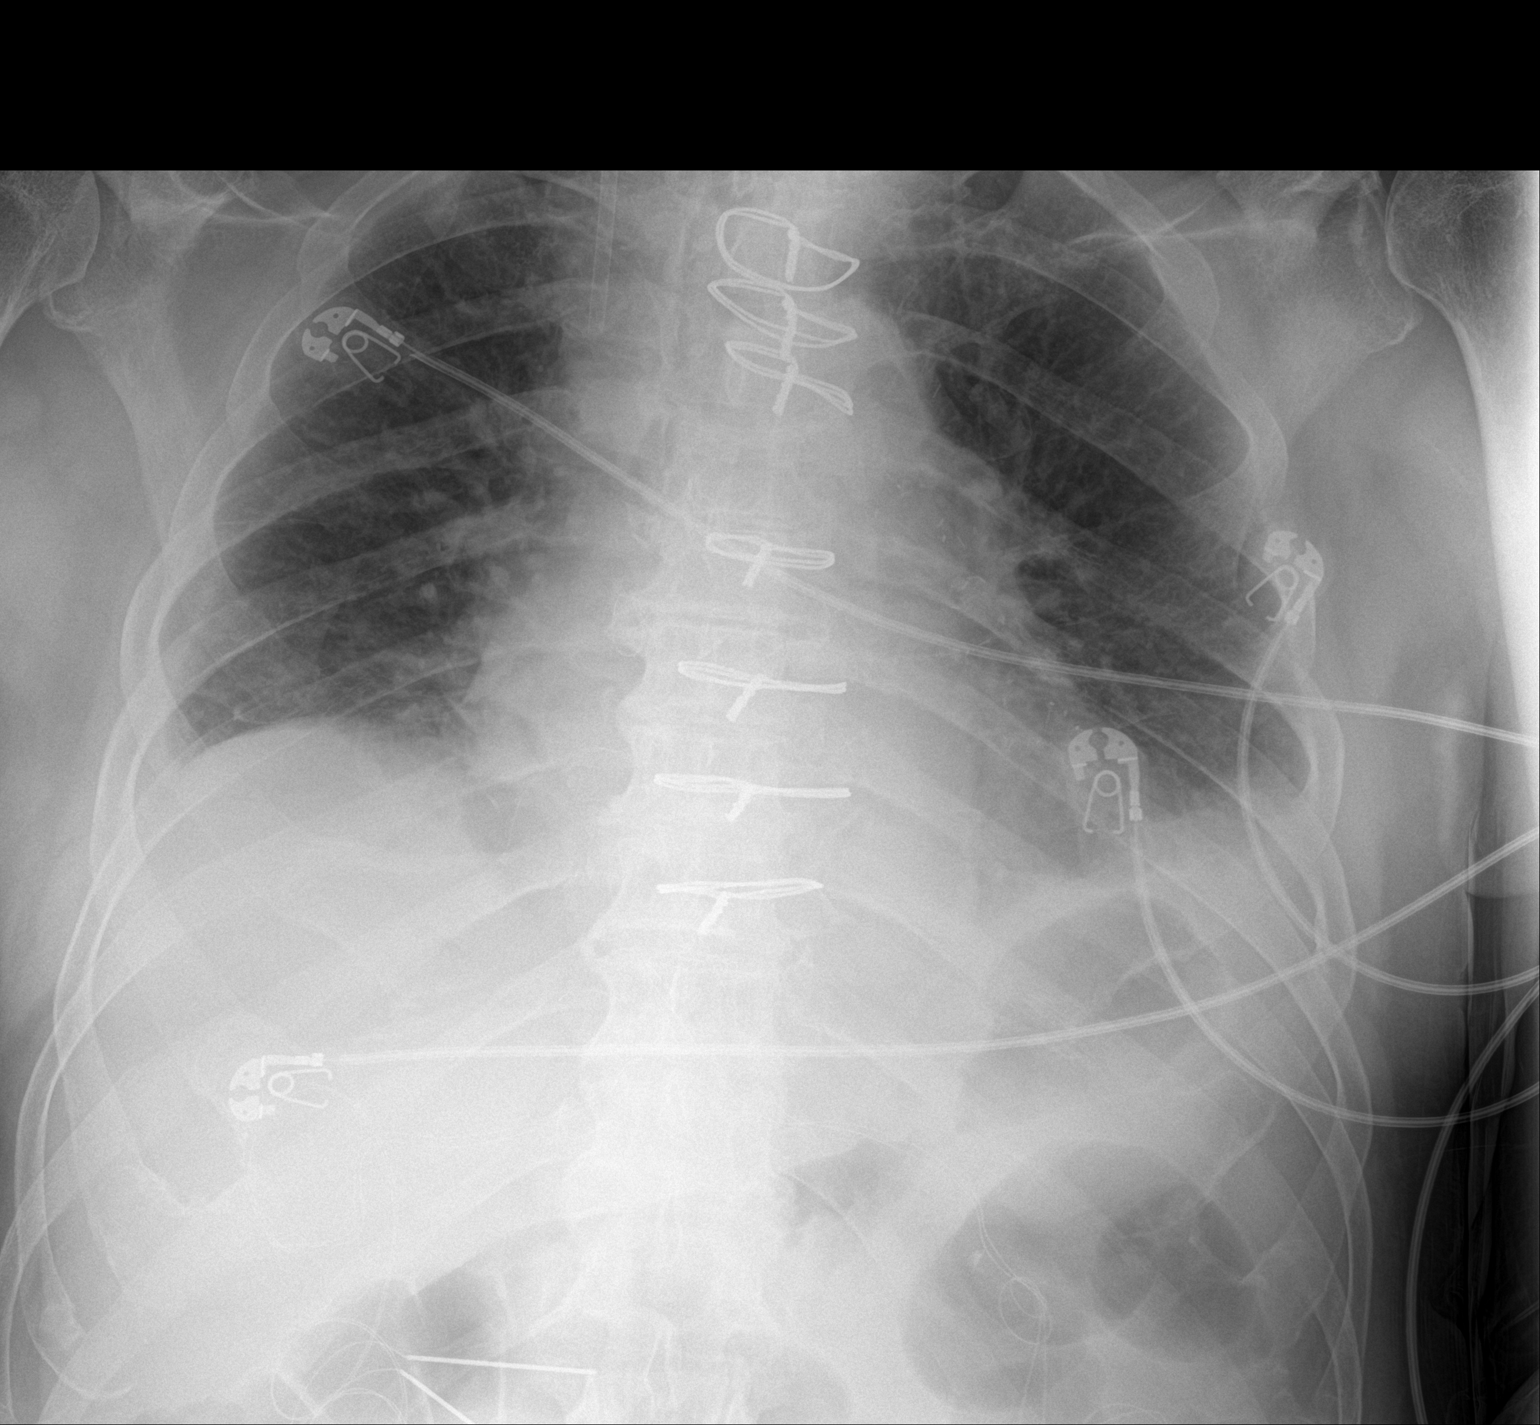

[1 of 1 positions shown; findings below may reference images not displayed]

FINDINGS: Swan-Ganz catheter is been removed. Venous access sheath remains in
place. Mediastinal drain and left chest tube have been removed. No
pneumothorax. Mild bibasilar atelectasis persists.
IMPRESSION: Mild bibasilar atelectasis. No pneumothorax post chest tube removal.

## 2018-10-04 ENCOUNTER — Other Ambulatory Visit: Payer: Self-pay | Admitting: Internal Medicine

## 2018-10-04 DIAGNOSIS — I700 Atherosclerosis of aorta: Secondary | ICD-10-CM

## 2018-10-04 IMAGING — DX DG CHEST 1V PORT
1 series · 1 of 1 positions shown · non-contrast
Comparison: 02/15/2017

CLINICAL DATA: CABG

EXAM:
PORTABLE CHEST 1 VIEW

[chest ap]
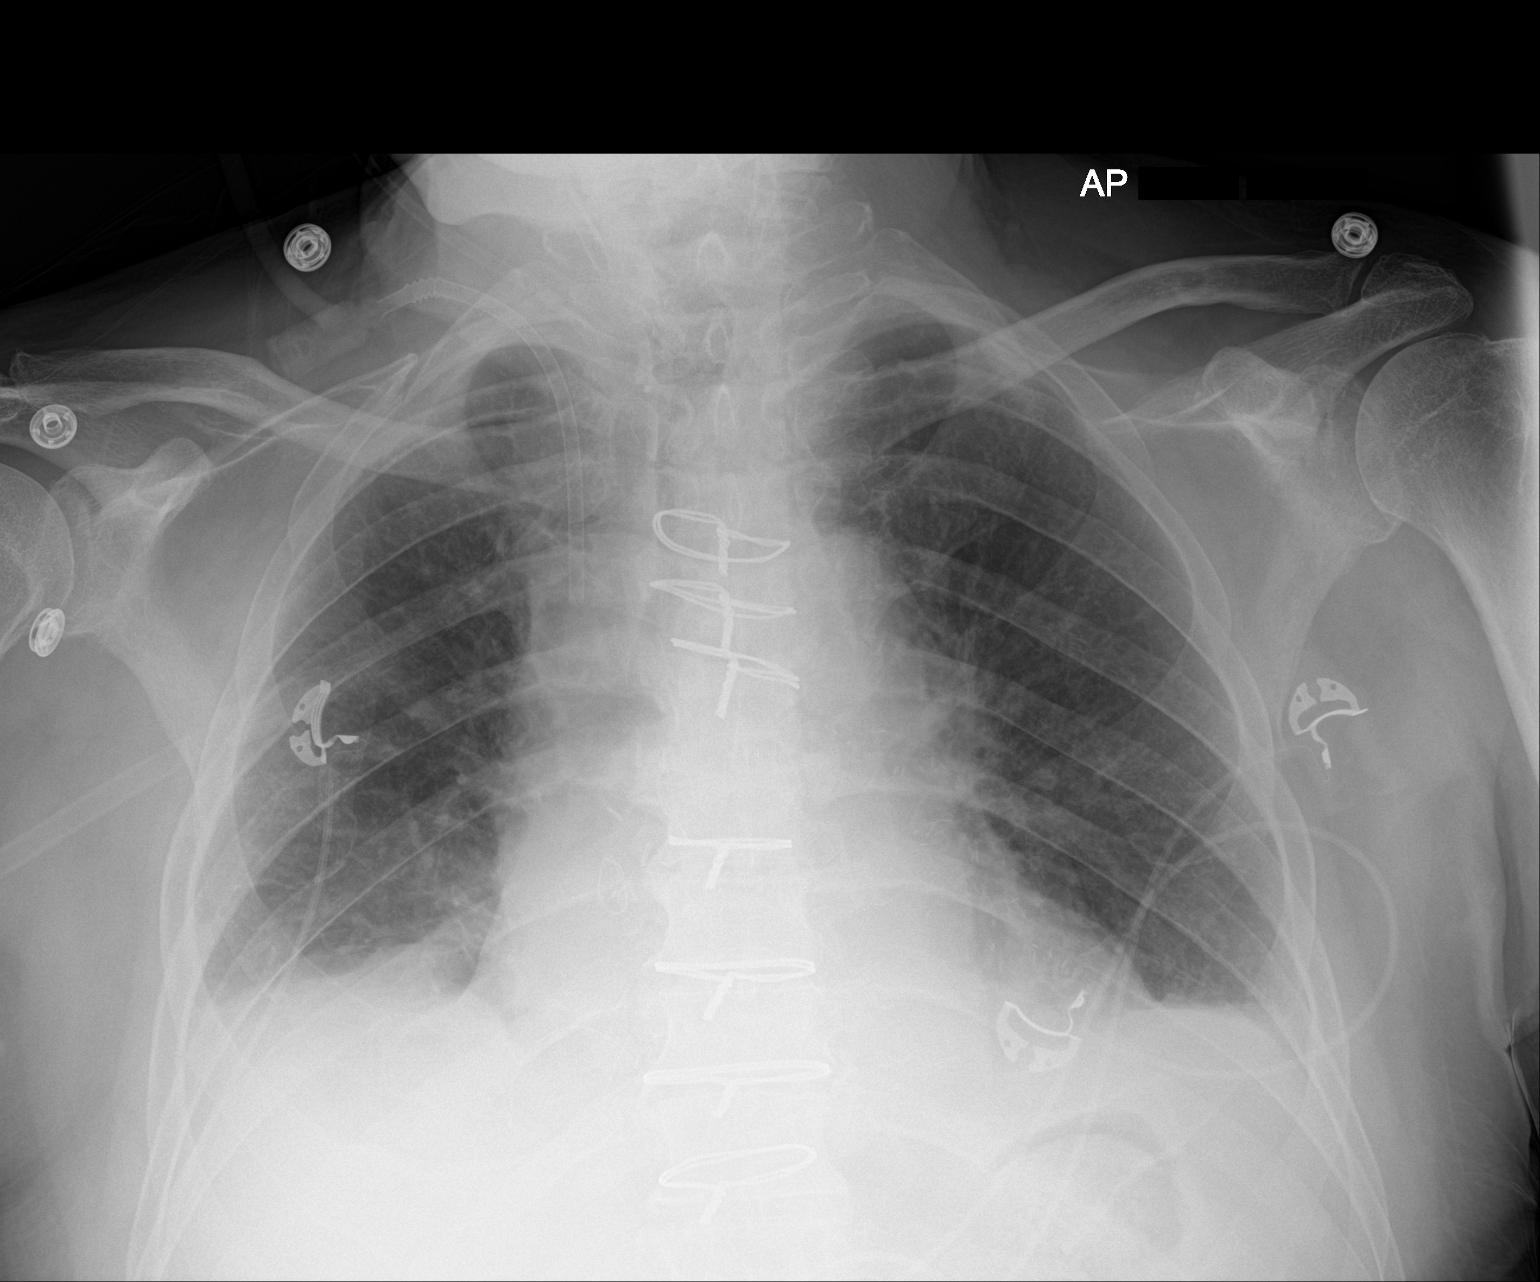

[1 of 1 positions shown; findings below may reference images not displayed]

FINDINGS: Low lung volumes with bibasilar atelectasis. Small effusions
suspected. Heart is borderline in size. Prior CABG.
IMPRESSION: Stable bibasilar atelectasis and small effusions.

## 2018-10-07 DIAGNOSIS — N400 Enlarged prostate without lower urinary tract symptoms: Secondary | ICD-10-CM | POA: Diagnosis not present

## 2018-10-12 ENCOUNTER — Ambulatory Visit (INDEPENDENT_AMBULATORY_CARE_PROVIDER_SITE_OTHER): Payer: PPO | Admitting: Cardiovascular Disease

## 2018-10-12 ENCOUNTER — Other Ambulatory Visit: Payer: Self-pay

## 2018-10-12 ENCOUNTER — Encounter: Payer: Self-pay | Admitting: Cardiovascular Disease

## 2018-10-12 VITALS — BP 122/64 | HR 64 | Ht 68.0 in | Wt 180.5 lb

## 2018-10-12 DIAGNOSIS — I2510 Atherosclerotic heart disease of native coronary artery without angina pectoris: Secondary | ICD-10-CM | POA: Diagnosis not present

## 2018-10-12 DIAGNOSIS — E785 Hyperlipidemia, unspecified: Secondary | ICD-10-CM

## 2018-10-12 NOTE — Patient Instructions (Signed)
Medication Instructions:  No changes  If you need a refill on your cardiac medications before your next appointment, please call your pharmacy.   Lab work: None ordered  Testing/Procedures: None ordered  Follow-Up: At [facility], you and your health needs are our priority.  As part of our continuing mission to provide you with exceptional heart care, we have created designated Provider Care Teams.  These Care Teams include your primary Cardiologist (physician) and Advanced Practice Providers (APPs -  Physician Assistants and Nurse Practitioners) who all work together to provide you with the care you need, when you need it. You will need a follow up appointment in 12 months.  Please call our office 2 months in advance to schedule this appointment.  You may see Muhammad Arida, MD or one of the following Advanced Practice Providers on your designated Care Team:   Christopher Berge, NP Ryan Dunn, PA-C . Jacquelyn Visser, PA-C    

## 2018-10-12 NOTE — Progress Notes (Signed)
Cardiology Office Note   Date:  10/12/2018   ID:  Richard Rivas, DOB January 04, 1941, MRN [id number]  PCP:  Richard Aus, MD  Cardiologist:   Richard Sacramento, MD   Chief Complaint  Patient presents with   other    Patient last seen in 2018. Meds reviewed by the pt. verbally. "doing well."       History of Present Illness: [name] is a 78 y.o. male who presents for a follow-up visit regarding coronary artery disease.  The patient has known history of prior tobacco use and family history of coronary artery disease.  He had a small non-ST elevation myocardial infarction in September 2018.  Cardiac catheterization showed severe distal left main stenosis into the ostium of the LAD and left circumflex.  There was mild RCA disease.  Ejection fraction was normal.  The patient underwent CABG in September, 2018 with LIMA to LAD, SVG to left circumflex and SVG to PDA.  He has been doing extremely well with no chest pain, shortness of breath or palpitations.  He exercises regularly with no limitations.   Past Medical History:  Diagnosis Date   BPPV (benign paroxysmal positional vertigo)    Colon polyps    Coronary artery disease    Depression    GERD (gastroesophageal reflux disease)    History of kidney stones    Hypertension    Kidney stones    Myocardial infarction [facility])     Past Surgical History:  Procedure Laterality Date   CARDIAC CATHETERIZATION     CERVICAL DISC SURGERY     COLON SURGERY     COLONOSCOPY WITH PROPOFOL N/A 10/11/2015   Procedure: COLONOSCOPY WITH PROPOFOL;  Surgeon: Manya Silvas, MD;  Location: [place];  Service: Endoscopy;  Laterality: N/A;   CORONARY ARTERY BYPASS GRAFT N/A 02/13/2017   Procedure: CORONARY ARTERY BYPASS GRAFTING (CABG) x3 (LIMA to LAD, SVG to CIRCUMFLEX , SVG to PDA )  , using left internal mammary artery and right leg greater saphenous vein harvested endoscopically;  Surgeon: [name], MD;  Location: [place];   Service: Open Heart Surgery;  Laterality: N/A;   KIDNEY STONE SURGERY     LAPAROSCOPIC RIGHT COLECTOMY Right 11/14/2014   Procedure: LAPAROSCOPIC RIGHT COLECTOMY;  Surgeon: Leonie Green, MD;  Location: [facility] ORS;  Service: General;  Laterality: Right;   LEFT HEART CATH AND CORONARY ANGIOGRAPHY N/A 02/12/2017   Procedure: LEFT HEART CATH AND CORONARY ANGIOGRAPHY and possible pci;  Surgeon: Yolonda Kida, MD;  Location: [place] CV LAB;  Service: Cardiovascular;  Laterality: N/A;   SHOULDER ARTHROSCOPY W/ ROTATOR CUFF REPAIR Right    TEE WITHOUT CARDIOVERSION N/A 02/13/2017   Procedure: TRANSESOPHAGEAL ECHOCARDIOGRAM (TEE);  Surgeon: [name], MD;  Location: [place];  Service: Open Heart Surgery;  Laterality: N/A;     Current Outpatient Medications  Medication Sig Dispense Refill   acetaminophen (TYLENOL) 325 MG tablet Take 2 tablets (650 mg total) by mouth every 6 (six) hours as needed for mild pain.     aspirin EC 325 MG EC tablet Take 1 tablet (325 mg total) by mouth daily. 30 tablet 0   atorvastatin (LIPITOR) 80 MG tablet TAKE 1 TABLET BY MOUTH EVERY DAY     vitamin B-12 (CYANOCOBALAMIN) 1000 MCG tablet Take 1,000 mcg by mouth daily.     No current facility-administered medications for this visit.     Allergies:   Bee venom    Social  History:  The patient  reports that he quit smoking about 23 years ago. His smoking use included cigarettes. He quit after 40.00 years of use. He quit smokeless tobacco use about 5 years ago. He reports current alcohol use. He reports that he does not use drugs.   Family History:  The patient's family history includes Heart attack in his brother and father.    ROS:  Please see the history of present illness.   Otherwise, review of systems are positive for none.   All other systems are reviewed and negative.    PHYSICAL EXAM: VS:  BP 122/64 (BP Location: Left Arm, Patient Position: Sitting, Cuff Size: Normal)    Pulse 64     Ht 5\' 8"  (1.727 m)    Wt 180 lb 8 oz (81.9 kg)    BMI 27.44 kg/m  , BMI Body mass index is 27.44 kg/m. GEN: Well nourished, well developed, in no acute distress  HEENT: normal  Neck: no JVD, carotid bruits, or masses Cardiac: RRR; no murmurs, rubs, or gallops,no edema  Respiratory:  clear to auscultation bilaterally, normal work of breathing GI: soft, nontender, nondistended, + BS MS: no deformity or atrophy  Skin: warm and dry.  Mild rash on the back Neuro:  Strength and sensation are intact Psych: euthymic mood, full affect   EKG:  EKG is ordered today. The ekg ordered today demonstrates normal sinus rhythm with no sick ST or T wave changes.   Recent Labs: No results found for requested labs within last 8760 hours.    Lipid Panel    Component Value Date/Time   CHOL 150 02/12/2017 0527   TRIG 102 02/12/2017 0527   HDL 43 02/12/2017 0527   CHOLHDL 3.5 02/12/2017 0527   VLDL 20 02/12/2017 0527   LDLCALC 87 02/12/2017 0527      Wt Readings from Last 3 Encounters:  10/12/18 180 lb 8 oz (81.9 kg)  05/04/17 171 lb 12 oz (77.9 kg)  03/23/17 163 lb (73.9 kg)      PAD Screen 05/04/2017  Previous PAD dx? No  Previous surgical procedure? No  Pain with walking? No  Feet/toe relief with dangling? No  Painful, non-healing ulcers? No  Extremities discolored? No      ASSESSMENT AND PLAN:  1.  Coronary artery disease involving native coronary arteries without angina: He is doing extremely well with no anginal symptoms.  Continue medical therapy.  2.  Hyperlipidemia: Continue high-dose atorvastatin.  I reviewed most recent labs from last month which showed an LDL of 57 and triglyceride of 44.  The rest of his labs were within normal limits.   Disposition:   FU with me in 12 months  Signed,  Richard Sacramento, MD  10/12/2018 9:35 AM    [place]

## 2018-11-12 ENCOUNTER — Ambulatory Visit
Admission: RE | Admit: 2018-11-12 | Discharge: 2018-11-12 | Disposition: A | Discharge status: Home / Self Care | Payer: PPO | Source: Ambulatory Visit | Attending: Internal Medicine | Admitting: Internal Medicine

## 2018-11-12 ENCOUNTER — Other Ambulatory Visit: Payer: Self-pay

## 2018-11-12 DIAGNOSIS — I700 Atherosclerosis of aorta: Secondary | ICD-10-CM | POA: Diagnosis not present

## 2018-11-12 DIAGNOSIS — I77811 Abdominal aortic ectasia: Secondary | ICD-10-CM | POA: Diagnosis not present

## 2018-12-08 ENCOUNTER — Encounter: Payer: Self-pay | Admitting: Physician Assistant

## 2018-12-08 DIAGNOSIS — I25810 Atherosclerosis of coronary artery bypass graft(s) without angina pectoris: Secondary | ICD-10-CM | POA: Diagnosis not present

## 2018-12-08 DIAGNOSIS — R0789 Other chest pain: Secondary | ICD-10-CM | POA: Diagnosis not present

## 2018-12-08 DIAGNOSIS — Z86010 Personal history of colonic polyps: Secondary | ICD-10-CM | POA: Diagnosis not present

## 2018-12-08 DIAGNOSIS — Z860101 Personal history of adenomatous and serrated colon polyps: Secondary | ICD-10-CM | POA: Insufficient documentation

## 2018-12-08 DIAGNOSIS — R079 Chest pain, unspecified: Secondary | ICD-10-CM | POA: Diagnosis not present

## 2018-12-08 NOTE — Progress Notes (Signed)
Cardiology Office Note Date:  12/09/2018  Patient ID:  Richard Rivas, Richard Rivas 25-Jul-1940, MRN [id number] PCP:  Rusty Aus, MD  Cardiologist:  Dr. Fletcher Anon, MD    Chief Complaint: Chest pain  History of Present Illness: Richard Rivas is a 78 y.o. male with history of CAD with small non-STEMI in 01/2017 status post three-vessel CABG, ectatic aorta, hypertension, prior tobacco abuse, nephrolithiasis, BPPV, depression, and GERD who presents for evaluation of chest pain.  Patient was admitted in 01/2017 with a small non-STEMI.  Cardiac catheterization at that time revealed severe distal left main stenosis into the ostium of the LAD and LCx with mild RCA disease.  EF was normal.  He underwent successful three-vessel CABG with LIMA to LAD, SVG to LCx, and SVG to PDA.  He has not had any ischemic evaluation since.  Pre-bypass carotid artery ultrasound in 01/2017 showed 1 to 39% bilateral internal carotid artery stenosis with antegrade vertebral flow.  Pre-CABG lower extremity ABIs were normal with a right sided ABI of 1.06 and left-sided ABI of 1.02.  He was last seen in the office on 10/12/2018 and was doing extremely well without any complaints.  He most recently underwent aortic ultrasound on 11/12/2018 which showed aortoiliac atherosclerosis with ectasia with a maximal aortic diameter of 2.8 cm and a maximal left iliac diameter of 1.9 cm which is followed by his PCP.  Patient comes in noting a 3 to 4-week history of intermittent sharp left upper anterior chest wall chest pain that is not related to exertion.  Pain will come on randomly.  Pain does not feel similar to his prior MI.  Pain will last anywhere from several minutes to all day long.  He last had this pain on 7/8 while undergoing pre-admission evaluation for planned colonoscopy.  Since then he has been chest pain-free and is currently chest pain-free.  There are no associated symptoms.  He reports compliance with medications.  He denies any falls, BRBPR,  or melena.  He denies any recent injury or trauma to the region.  Currently chest pain-free.  Labs: 09/2018- Hgb 14.2, potassium 4.7, serum creatinine 1.0, AST/ALT normal, albumin 4.0, LDL 57, triglyceride 44, TSH normal  Past Medical History:  Diagnosis Date   BPPV (benign paroxysmal positional vertigo)    Colon polyps    Coronary artery disease    a.  Status post three-vessel CABG in 01/2017 with LIMA to LAD, SVG to LCx, SVG to PDA   Depression    GERD (gastroesophageal reflux disease)    Hypertension    Kidney stones    Myocardial infarction [facility])     Past Surgical History:  Procedure Laterality Date   CARDIAC CATHETERIZATION     CERVICAL DISC SURGERY     COLON SURGERY     COLONOSCOPY WITH PROPOFOL N/A 10/11/2015   Procedure: COLONOSCOPY WITH PROPOFOL;  Surgeon: Manya Silvas, MD;  Location: [facility];  Service: Endoscopy;  Laterality: N/A;   CORONARY ARTERY BYPASS GRAFT N/A 02/13/2017   Procedure: CORONARY ARTERY BYPASS GRAFTING (CABG) x3 (LIMA to LAD, SVG to CIRCUMFLEX , SVG to PDA )  , using left internal mammary artery and right leg greater saphenous vein harvested endoscopically;  Surgeon: [name], MD;  Location: [facility];  Service: Open Heart Surgery;  Laterality: N/A;   KIDNEY STONE SURGERY     LAPAROSCOPIC RIGHT COLECTOMY Right 11/14/2014   Procedure: LAPAROSCOPIC RIGHT COLECTOMY;  Surgeon: Leonie Green, MD;  Location: [facility];  Service: General;  Laterality: Right;   LEFT HEART CATH AND CORONARY ANGIOGRAPHY N/A 02/12/2017   Procedure: LEFT HEART CATH AND CORONARY ANGIOGRAPHY and possible pci;  Surgeon: Yolonda Kida, MD;  Location: [facility];  Service: Cardiovascular;  Laterality: N/A;   SHOULDER ARTHROSCOPY W/ ROTATOR CUFF REPAIR Right    TEE WITHOUT CARDIOVERSION N/A 02/13/2017   Procedure: TRANSESOPHAGEAL ECHOCARDIOGRAM (TEE);  Surgeon: [name], MD;  Location: [facility];  Service: Open Heart Surgery;  Laterality:  N/A;    Current Meds  Medication Sig   acetaminophen (TYLENOL) 325 MG tablet Take 2 tablets (650 mg total) by mouth every 6 (six) hours as needed for mild pain.   aspirin EC 325 MG EC tablet Take 1 tablet (325 mg total) by mouth daily.   atorvastatin (LIPITOR) 80 MG tablet TAKE 1 TABLET BY MOUTH EVERY DAY   vitamin B-12 (CYANOCOBALAMIN) 1000 MCG tablet Take 1,000 mcg by mouth daily as needed.     Allergies:   Bee venom   Social History:  The patient  reports that he quit smoking about 24 years ago. His smoking use included cigarettes. He quit after 40.00 years of use. He quit smokeless tobacco use about 6 years ago. He reports current alcohol use. He reports that he does not use drugs.   Family History:  The patient's family history includes Heart attack in his brother and father.  ROS:   Review of Systems  Constitutional: Negative for chills, diaphoresis, fever, malaise/fatigue and weight loss.  HENT: Negative for congestion.   Eyes: Negative for discharge and redness.  Respiratory: Negative for cough, hemoptysis, sputum production, shortness of breath and wheezing.   Cardiovascular: Positive for chest pain. Negative for palpitations, orthopnea, claudication, leg swelling and PND.  Gastrointestinal: Negative for abdominal pain, blood in stool, heartburn, melena, nausea and vomiting.  Genitourinary: Negative for hematuria.  Musculoskeletal: Negative for falls and myalgias.  Skin: Negative for rash.  Neurological: Negative for dizziness, tingling, tremors, sensory change, speech change, focal weakness, loss of consciousness and weakness.  Endo/Heme/Allergies: Does not bruise/bleed easily.  Psychiatric/Behavioral: Negative for substance abuse. The patient is not nervous/anxious.   All other systems reviewed and are negative.    PHYSICAL EXAM:  VS:  BP 130/70 (BP Location: Left Arm, Patient Position: Sitting, Cuff Size: Normal)    Pulse 69    Temp (!) 97.2 F (36.2 C)    Ht 5\' 8"   (1.727 m)    Wt 176 lb 8 oz (80.1 kg)    SpO2 96%    BMI 26.84 kg/m  BMI: Body mass index is 26.84 kg/m.  Physical Exam  Constitutional: He is oriented to person, place, and time. He appears well-developed and well-nourished.  HENT:  Head: Normocephalic and atraumatic.  Eyes: Right eye exhibits no discharge. Left eye exhibits no discharge.  Neck: Normal range of motion. No JVD present.  Cardiovascular: Normal rate, regular rhythm, S1 normal, S2 normal and normal heart sounds. Exam reveals no distant heart sounds, no friction rub, no midsystolic click and no opening snap.  No murmur heard. Pulses:      Posterior tibial pulses are 2+ on the right side and 2+ on the left side.  Chest pain is completely reproducible to palpation on exam today.  Pulmonary/Chest: Effort normal and breath sounds normal. No respiratory distress. He has no decreased breath sounds. He has no wheezes. He has no rales. He exhibits no tenderness.  Abdominal: Soft. He exhibits no distension. There is  no abdominal tenderness.  Musculoskeletal:        General: No edema.  Neurological: He is alert and oriented to person, place, and time.  Skin: Skin is warm and dry. No cyanosis. Nails show no clubbing.  Psychiatric: He has a normal mood and affect. His speech is normal and behavior is normal. Judgment and thought content normal.     EKG:  Was ordered and interpreted by me today. Shows NSR, 69 bpm, no acute ST-T changes (unchanged from prior)  Recent Labs: No results found for requested labs within last 8760 hours.  No results found for requested labs within last 8760 hours.   CrCl cannot be calculated (Patient's most recent lab result is older than the maximum 21 days allowed.).   Wt Readings from Last 3 Encounters:  12/09/18 176 lb 8 oz (80.1 kg)  10/12/18 180 lb 8 oz (81.9 kg)  05/04/17 171 lb 12 oz (77.9 kg)     Other studies reviewed: Additional studies/records reviewed today include: summarized  above  ASSESSMENT AND PLAN:  1. CAD involving the native coronary arteries status post CABG with chest pain with moderate risk for cardiac etiology with atypical features: Currently, chest pain-free.  Chest pain has been present for the past 3 to 4 weeks and is located in the left upper anterior chest wall and is described as sharp in nature.  Pain is reproducible to palpation without any associated symptoms.  Pain does not feel similar to his prior MI.  Discussed noninvasive and invasive work-up as well as medical management.  We have agreed to start isosorbide mononitrate 30 mg daily and schedule the patient for [employer].  If this is unrevealing would recommend he follow-up with PCP for potential noncardiac etiology of symptoms given atypical features and unchanged EKG.  Remains on full dose aspirin.  Has not been maintained on beta-blocker with heart rates typically in the 60s bpm.  Continue Lipitor 80 mg daily as outlined below.  Aggressive risk factor modification and secondary prevention.  2. Hyperlipidemia: Most recent LDL of 57 with a triglyceride of 44 from 09/2018 with normal liver function at that time.  Continue high-dose atorvastatin.  3. Ectatic aorta: Followed by PCP.  Most recent abdominal aorta ultrasound stable as outlined above.  Optimal blood pressure and lipid control recommended.  4. Hypertension: Blood pressure is reasonably controlled today.  Imdur added as above.  Disposition: F/u with Dr. Fletcher Anon or an APP in 2 to 4 weeks.  Current medicines are reviewed at length with the patient today.  The patient did not have any concerns regarding medicines.  Signed, Christell Faith, PA-C 12/09/2018 8:58 AM     [facility] [address] [place] [address] [place], [place] [address] [phone]

## 2018-12-09 ENCOUNTER — Ambulatory Visit (INDEPENDENT_AMBULATORY_CARE_PROVIDER_SITE_OTHER): Payer: PPO | Admitting: Physician Assistant

## 2018-12-09 ENCOUNTER — Encounter: Payer: Self-pay | Admitting: Physician Assistant

## 2018-12-09 ENCOUNTER — Other Ambulatory Visit: Payer: Self-pay

## 2018-12-09 VITALS — BP 130/70 | HR 69 | Temp 97.2°F | Ht 68.0 in | Wt 176.5 lb

## 2018-12-09 DIAGNOSIS — I10 Essential (primary) hypertension: Secondary | ICD-10-CM

## 2018-12-09 DIAGNOSIS — I25118 Atherosclerotic heart disease of native coronary artery with other forms of angina pectoris: Secondary | ICD-10-CM | POA: Diagnosis not present

## 2018-12-09 DIAGNOSIS — E785 Hyperlipidemia, unspecified: Secondary | ICD-10-CM | POA: Diagnosis not present

## 2018-12-09 DIAGNOSIS — I77819 Aortic ectasia, unspecified site: Secondary | ICD-10-CM | POA: Diagnosis not present

## 2018-12-09 DIAGNOSIS — R079 Chest pain, unspecified: Secondary | ICD-10-CM

## 2018-12-09 MED ORDER — ISOSORBIDE MONONITRATE ER 30 MG PO TB24: 30.0000 mg | ORAL_TABLET | Freq: Every day | ORAL | 3 refills | Status: DC

## 2018-12-09 NOTE — Patient Instructions (Addendum)
Medication Instructions:  Your physician has recommended you make the following change in your medication:  1. START Isosorbide mononitrate 30 mg once daily  If you need a refill on your cardiac medications before your next appointment, please call your pharmacy.   Lab work: None If you have labs (blood work) drawn today and your tests are completely normal, you will receive your results only by: Marland Kitchen MyChart Message (if you have MyChart) OR . A paper copy in the mail If you have any lab test that is abnormal or we need to change your treatment, we will call you to review the results.  Testing/Procedures: [place]  Your caregiver has ordered a Stress Test with nuclear imaging. The purpose of this test is to evaluate the blood supply to your heart muscle. This procedure is referred to as a "Non-Invasive Stress Test." This is because other than having an IV started in your vein, nothing is inserted or "invades" your body. Cardiac stress tests are done to find areas of poor blood flow to the heart by determining the extent of coronary artery disease (CAD). Some patients exercise on a treadmill, which naturally increases the blood flow to your heart, while others who are  unable to walk on a treadmill due to physical limitations have a pharmacologic/chemical stress agent called Lexiscan . This medicine will mimic walking on a treadmill by temporarily increasing your coronary blood flow.   Please note: these test may take anywhere between 2-4 hours to complete  PLEASE REPORT TO [place] TO GO  Date of Procedure: Tomorrow July 10th   Arrival Time for Procedure: 07:30 AM   PLEASE NOTIFY THE OFFICE AT LEAST 53 HOURS IN ADVANCE IF YOU ARE UNABLE TO KEEP YOUR APPOINTMENT.  [phone] AND  PLEASE NOTIFY NUCLEAR MEDICINE AT [facility] AT LEAST 24 HOURS IN ADVANCE IF YOU ARE UNABLE TO KEEP YOUR APPOINTMENT. [phone]  How to  prepare for your Myoview test:  1. Do not eat or drink after midnight 2. No caffeine for 24 hours prior to test 3. No smoking 24 hours prior to test. 4. Your medication may be taken with water.  If your doctor stopped a medication because of this test, do not take that medication. 5. Ladies, please do not wear dresses.  Skirts or pants are appropriate. Please wear a short sleeve shirt. 6. No perfume, cologne or lotion. 7. Wear comfortable walking shoes. No heels!    Follow-Up: At [facility], you and your health needs are our priority.  As part of our continuing mission to provide you with exceptional heart care, we have created designated Provider Care Teams.  These Care Teams include your primary Cardiologist (physician) and Advanced Practice Providers (APPs -  Physician Assistants and Nurse Practitioners) who all work together to provide you with the care you need, when you need it. You will need a follow up appointment in 3 weeks.  Please call our office 2 months in advance to schedule this appointment.  You may see Dr. Fletcher Anon or one of the following Advanced Practice Providers on your designated Care Team:   Murray Hodgkins, NP Christell Faith, PA-C . Marrianne Mood, PA-C  Any Other Special Instructions Will Be Listed Below (If Applicable).     Cardiac Nuclear Scan A cardiac nuclear scan is a test that is done to check the flow of blood to your heart. It is done when you are resting  and when you are exercising. The test looks for problems such as:  Not enough blood reaching a portion of the heart.  The heart muscle not working as it should. You may need this test if:  You have heart disease.  You have had lab results that are not normal.  You have had heart surgery or a balloon procedure to open up blocked arteries (angioplasty).  You have chest pain.  You have shortness of breath. In this test, a special dye (tracer) is put into your bloodstream. The tracer will travel  to your heart. A camera will then take pictures of your heart to see how the tracer moves through your heart. This test is usually done at a hospital and takes 2-4 hours. Tell a doctor about:  Any allergies you have.  All medicines you are taking, including vitamins, herbs, eye drops, creams, and over-the-counter medicines.  Any problems you or family members have had with anesthetic medicines.  Any blood disorders you have.  Any surgeries you have had.  Any medical conditions you have.  Whether you are pregnant or may be pregnant. What are the risks? Generally, this is a safe test. However, problems may occur, such as:  Serious chest pain and heart attack. This is only a risk if the stress portion of the test is done.  Rapid heartbeat.  A feeling of warmth in your chest. This feeling usually does not last long.  Allergic reaction to the tracer. What happens before the test?  Ask your doctor about changing or stopping your normal medicines. This is important.  Follow instructions from your doctor about what you cannot eat or drink.  Remove your jewelry on the day of the test. What happens during the test?  An IV tube will be inserted into one of your veins.  Your doctor will give you a small amount of tracer through the IV tube.  You will wait for 20-40 minutes while the tracer moves through your bloodstream.  Your heart will be monitored with an electrocardiogram (ECG).  You will lie down on an exam table.  Pictures of your heart will be taken for about 15-20 minutes.  You may also have a stress test. For this test, one of these things may be done: ? You will be asked to exercise on a treadmill or a stationary bike. ? You will be given medicines that will make your heart work harder. This is done if you are unable to exercise.  When blood flow to your heart has peaked, a tracer will again be given through the IV tube.  After 20-40 minutes, you will get back on  the exam table. More pictures will be taken of your heart.  Depending on the tracer that is used, more pictures may need to be taken 3-4 hours later.  Your IV tube will be removed when the test is over. The test may vary among doctors and hospitals. What happens after the test?  Ask your doctor: ? Whether you can return to your normal schedule, including diet, activities, and medicines. ? Whether you should drink more fluids. This will help to remove the tracer from your body. Drink enough fluid to keep your pee (urine) pale yellow.  Ask your doctor, or the department that is doing the test: ? When will my results be ready? ? How will I get my results? Summary  A cardiac nuclear scan is a test that is done to check the flow of blood to your  heart.  Tell your doctor whether you are pregnant or may be pregnant.  Before the test, ask your doctor about changing or stopping your normal medicines. This is important.  Ask your doctor whether you can return to your normal activities. You may be asked to drink more fluids. This information is not intended to replace advice given to you by your health care provider. Make sure you discuss any questions you have with your health care provider. Document Released: 11/02/2017 Document Revised: 09/08/2018 Document Reviewed: 11/02/2017 Elsevier Patient Education  2020 [employer].

## 2018-12-10 ENCOUNTER — Ambulatory Visit
Admission: RE | Admit: 2018-12-10 | Discharge: 2018-12-10 | Disposition: A | Discharge status: Home / Self Care | Payer: PPO | Source: Ambulatory Visit | Attending: Physician Assistant | Admitting: Physician Assistant

## 2018-12-10 ENCOUNTER — Other Ambulatory Visit: Payer: Self-pay | Admitting: Physician Assistant

## 2018-12-10 DIAGNOSIS — R079 Chest pain, unspecified: Secondary | ICD-10-CM

## 2018-12-10 DIAGNOSIS — I25118 Atherosclerotic heart disease of native coronary artery with other forms of angina pectoris: Secondary | ICD-10-CM | POA: Insufficient documentation

## 2018-12-10 LAB — NM MYOCAR MULTI W/SPECT W/WALL MOTION / EF
Estimated workload: 1 METS
Exercise duration (min): 0 min
Exercise duration (sec): 0 s
LV dias vol: 98 mL (ref 62–150)
LV sys vol: 30 mL
MPHR: 145 {beats}/min
Peak HR: 79 {beats}/min
Percent HR: 54 %
Rest HR: 60 {beats}/min
SDS: 0
SRS: 5
SSS: 2
TID: 1.02

## 2018-12-10 MED ORDER — NITROGLYCERIN 0.4 MG SL SUBL: 0.4000 mg | SUBLINGUAL_TABLET | SUBLINGUAL | Status: AC | PRN

## 2018-12-10 MED ORDER — REGADENOSON 0.4 MG/5ML IV SOLN
0.4000 mg | Freq: Once | INTRAVENOUS | Status: AC
  Administered 2018-12-10: 0.4 mg via INTRAVENOUS

## 2018-12-10 MED ORDER — TECHNETIUM TC 99M TETROFOSMIN IV KIT
30.0000 | PACK | Freq: Once | INTRAVENOUS | Status: AC | PRN
  Administered 2018-12-10: 32.34 via INTRAVENOUS

## 2018-12-10 MED ORDER — TECHNETIUM TC 99M TETROFOSMIN IV KIT
9.8900 | PACK | Freq: Once | INTRAVENOUS | Status: AC | PRN
  Administered 2018-12-10: 9.89 via INTRAVENOUS

## 2018-12-28 ENCOUNTER — Telehealth: Payer: Self-pay | Admitting: Cardiovascular Disease

## 2018-12-28 NOTE — Telephone Encounter (Signed)
° °  [facility] Pre-operative Risk Assessment    Request for surgical clearance:  1. What type of surgery is being performed? Colonoscopy  2. When is this surgery scheduled? 01/31/19  3. What type of clearance is required (medical clearance vs. Pharmacy clearance to hold med vs. Both)? both  4. Are there any medications that need to be held prior to surgery and how long? Any anticoagulants need to be advised  5. Practice name and name of physician performing surgery? [facility]  6. What is your office phone number [phone]   7.   What is your office fax number [phone]  8.   Anesthesia type (None, local, MAC, general) ? Monitored    Saunders Glance Bumgarner 12/28/2018, 3:22 PM  _________________________________________________________________   (provider comments below)

## 2018-12-28 NOTE — Telephone Encounter (Signed)
   Primary Cardiologist:Muhammad Fletcher Anon, MD  Chart reviewed as part of pre-operative protocol coverage. Patient recently seen in office and had reassuring stress test. F/u is planned on 12/30/18 to reassess symptoms and next steps. He has h/o NSTEMI 01/2017 and underwent CABG, also hx of GERD, depression, HTN, kidney stones, BPPV.   It makes most sense to keep f/u appt on Thursday as planned at which time the pre-op for this colonoscopy can be finalized. Given cardiac history, anticipate recommendation would be to continue at least a baby aspirin peri-procedurally as this does not typically require holding.  Will route to [employer] as FYI for clearance needed. Per office protocol, the provider should assess clearance at time of office visit and should forward their finalized clearance decision to requesting party below. I will remove this message from the pre-op box. I also added pre-op clearance modifier to his appt info.   Charlie Pitter, PA-C 12/28/2018, 5:09 PM

## 2018-12-28 NOTE — Progress Notes (Deleted)
Cardiology Office Note    Date:  12/28/2018   ID:  Richard Rivas, DOB Jan 09, 1941, MRN [id number]  PCP:  Rusty Aus, MD  Cardiologist:  Kathlyn Sacramento, MD  Electrophysiologist:  None   Chief Complaint: Follow-up  History of Present Illness:   Richard Rivas is a 78 y.o. male with history of CAD with small non-STEMI in 01/2017 status post three-vessel CABG, ectatic aorta, hypertension, prior tobacco abuse, nephrolithiasis, BPPV, depression, and GERD who presents for follow-up of chest pain status post recent Myoview.  Patient was admitted in 01/2017 with a small non-STEMI.  Cardiac cath at that time revealed severe distal left main stenosis into the ostium of the LAD and LCx with mild RCA disease.  EF was normal.  He underwent successful three-vessel CABG with LIMA to LAD, SVG to LCx, and SVG to PDA.  Pre-bypass carotid artery ultrasound in 01/2017 showed 1 to 39% bilateral internal carotid artery stenosis with antegrade vertebral flow.  Pre-CABG lower extremity ABIs were normal with a right sided ABI of 1.06 and left-sided ABI of 1.02.  Intraoperative TEE showed EF of 55 to 65%, normal wall motion. He was seen in the office on 10/12/2018 and was doing extremely well without any complaints.  He most recently underwent aortic ultrasound on 11/12/2018 which showed aortoiliac atherosclerosis with ectasia with a maximal aortic diameter of 2.8 cm and a maximal left iliac diameter of 1.9 cm which is followed by his PCP.  He was most recently seen in the office on 12/09/2018 noting a 3 to 4-week history of intermittent sharp left upper anterior chest wall pain that was not related to exertion and would occur randomly.  Pain did not feel similar to his prior MI.  Pain would last from several minutes to all day long.  Pain is reproducible to palpation on exam at that time.  He was started on Imdur.  Lexiscan Myoview showed no significant ischemia and EF estimated at 70%.  Overall this was a low risk scan.  ***   Labs: 09/2018- Hgb 14.2, potassium 4.7, serum creatinine 1.0, AST/ALT normal, albumin 4.0, LDL 57, triglyceride 44, TSH normal  Past Medical History:  Diagnosis Date  . BPPV (benign paroxysmal positional vertigo)   . Colon polyps   . Coronary artery disease    a.  Status post three-vessel CABG in 01/2017 with LIMA to LAD, SVG to LCx, SVG to PDA  . Depression   . GERD (gastroesophageal reflux disease)   . Hypertension   . Kidney stones   . Myocardial infarction [facility])     Past Surgical History:  Procedure Laterality Date  . CARDIAC CATHETERIZATION    . CERVICAL DISC SURGERY    . COLON SURGERY    . COLONOSCOPY WITH PROPOFOL N/A 10/11/2015   Procedure: COLONOSCOPY WITH PROPOFOL;  Surgeon: Manya Silvas, MD;  Location: [facility] ENDOSCOPY;  Service: Endoscopy;  Laterality: N/A;  . CORONARY ARTERY BYPASS GRAFT N/A 02/13/2017   Procedure: CORONARY ARTERY BYPASS GRAFTING (CABG) x3 (LIMA to LAD, SVG to CIRCUMFLEX , SVG to PDA )  , using left internal mammary artery and right leg greater saphenous vein harvested endoscopically;  Surgeon: [name], MD;  Location: [place];  Service: Open Heart Surgery;  Laterality: N/A;  . KIDNEY STONE SURGERY    . LAPAROSCOPIC RIGHT COLECTOMY Right 11/14/2014   Procedure: LAPAROSCOPIC RIGHT COLECTOMY;  Surgeon: Leonie Green, MD;  Location: [facility] ORS;  Service: General;  Laterality: Right;  . LEFT  HEART CATH AND CORONARY ANGIOGRAPHY N/A 02/12/2017   Procedure: LEFT HEART CATH AND CORONARY ANGIOGRAPHY and possible pci;  Surgeon: Yolonda Kida, MD;  Location: [place] CV LAB;  Service: Cardiovascular;  Laterality: N/A;  . SHOULDER ARTHROSCOPY W/ ROTATOR CUFF REPAIR Right   . TEE WITHOUT CARDIOVERSION N/A 02/13/2017   Procedure: TRANSESOPHAGEAL ECHOCARDIOGRAM (TEE);  Surgeon: [name], MD;  Location: [place];  Service: Open Heart Surgery;  Laterality: N/A;    Current Medications: No outpatient medications have been marked as taking for the  12/30/18 encounter (Appointment) with Rise Mu, PA-C.   ***   Allergies:   Bee venom   Social History   Socioeconomic History  . Marital status: Married    Spouse name: Not on file  . Number of children: Not on file  . Years of education: Not on file  . Highest education level: Not on file  Occupational History  . Not on file  Social Needs  . Financial resource strain: Not on file  . Food insecurity    Worry: Not on file    Inability: Not on file  . Transportation needs    Medical: Not on file    Non-medical: Not on file  Tobacco Use  . Smoking status: Former Smoker    Years: 40.00    Types: Cigarettes    Quit date: 11/06/1994    Years since quitting: 24.1  . Smokeless tobacco: Former Systems developer    Quit date: 11/05/2012  Substance and Sexual Activity  . Alcohol use: Yes    Comment: beer occasionally  . Drug use: No  . Sexual activity: Not on file  Lifestyle  . Physical activity    Days per week: Not on file    Minutes per session: Not on file  . Stress: Not on file  Relationships  . Social Herbalist on phone: Not on file    Gets together: Not on file    Attends religious service: Not on file    Active member of club or organization: Not on file    Attends meetings of clubs or organizations: Not on file    Relationship status: Not on file  Other Topics Concern  . Not on file  Social History Narrative  . Not on file     Family History:  The patient's ***family history includes Heart attack in his brother and father.  ROS:   ROS   EKGs/Labs/Other Studies Reviewed:    Studies reviewed were summarized above. The additional studies were reviewed today:  Lexiscan Myoview 12/10/2018: Pharmacological myocardial perfusion imaging study with no significant  ischemia Normal wall motion, EF estimated at 70% No EKG changes concerning for ischemia at peak stress or in recovery. Low risk scan __________  LHC 01/2017:  Mid RCA lesion, 50 %stenosed.  Prox  RCA lesion, 50 %stenosed.  LM lesion, 99 %stenosed.  Ost Cx lesion, 99 %stenosed.  Dist LAD lesion, 50 %stenosed.  The left ventricular systolic function is normal.  LV end diastolic pressure is normal.  The left ventricular ejection fraction is 55-65% by visual estimate.   Conclusion Diagnostic cardiac catheter with normal left ventricular function Severe distal left main disease 99% Severe ostial circumflex disease 99% Moderate LAD disease Moderate RCA disease Recommend transferred for coronary bypass surgery at [facility] __________  TEE 01/2017: - Left ventricle: Systolic function was normal. The estimated   ejection fraction was in the range of 55% to 65%. Wall motion was  normal; there were no regional wall motion abnormalities. - Staged echo: Limited Post-CPB exam: Good vigorous LV function.   Contractility is hyperdynamic, with no wall motion abnormalities.   Overall EF 65-70%.  Impressions:  - Normal study. LV function vigorous and hyperdynamic, when   compared to pre-bypass images. Post- Bypass valvular function   remains normal as well.  EKG:  EKG is *** ordered today.  The EKG ordered today demonstrates ***  Recent Labs: No results found for requested labs within last 8760 hours.  Recent Lipid Panel    Component Value Date/Time   CHOL 150 02/12/2017 0527   TRIG 102 02/12/2017 0527   HDL 43 02/12/2017 0527   CHOLHDL 3.5 02/12/2017 0527   VLDL 20 02/12/2017 0527   LDLCALC 87 02/12/2017 0527    PHYSICAL EXAM:    VS:  There were no vitals taken for this visit.  BMI: There is no height or weight on file to calculate BMI.  Physical Exam  Wt Readings from Last 3 Encounters:  12/09/18 176 lb 8 oz (80.1 kg)  10/12/18 180 lb 8 oz (81.9 kg)  05/04/17 171 lb 12 oz (77.9 kg)     ASSESSMENT & PLAN:   1. ***  Disposition: F/u with Dr. Fletcher Anon or an APP in ***.   Medication Adjustments/Labs and Tests Ordered: Current medicines are reviewed at length  with the patient today.  Concerns regarding medicines are outlined above. Medication changes, Labs and Tests ordered today are summarized above and listed in the Patient Instructions accessible in Encounters.   Signed, Christell Faith, PA-C 12/28/2018 8:57 AM     [facility] [address] [place] [address] [place], [place] [address] [phone]

## 2018-12-30 ENCOUNTER — Ambulatory Visit: Payer: PPO | Admitting: Physician Assistant

## 2018-12-30 NOTE — Telephone Encounter (Signed)
Patient was a no-show for his appointment today.  Chart will be routed back to preop pool as well as [place] office schedulers to get patient rescheduled.

## 2018-12-30 NOTE — Telephone Encounter (Signed)
Reduce aspirin to 81 mg once daily and continue without interruption.

## 2018-12-30 NOTE — Telephone Encounter (Signed)
Will await callback from patient to go over f/u symptoms and relay recommendation.

## 2018-12-30 NOTE — Telephone Encounter (Signed)
   Primary Cardiologist:Muhammad Fletcher Anon, MD  Chart revisited as part of pre-operative protocol coverage. I attempted to reach out to patient to discuss symptoms over the phone and got VM. LMOM to call back. Per d/w Thurmond Butts, given low risk nuc recently, should be fine to clear if no further symptoms. I also told him in that message that the scheduling team was notified of need to r/s as well. Will reach out to Dr. Fletcher Anon for input on his ASA - typically we can continue for colonoscopies, but he is still on 325mg  as a holdover from his NSTEMI/CABG In 2018. Appreciate input on whether this should be reduced to 81mg , and input on holding around time of colonoscopy if absolutely necessary. Dr. Fletcher Anon - Please route response to P [facility] PREOP (the pre-op pool). Thank you.  Charlie Pitter, PA-C 12/30/2018, 11:35 AM

## 2019-01-03 ENCOUNTER — Other Ambulatory Visit: Payer: Self-pay | Admitting: Cardiology

## 2019-01-03 MED ORDER — ASPIRIN EC 81 MG PO TBEC: 81.0000 mg | DELAYED_RELEASE_TABLET | Freq: Every day | ORAL | Status: DC

## 2019-01-03 NOTE — Telephone Encounter (Signed)
   Primary Cardiologist: Kathlyn Sacramento, MD  Chart reviewed as part of pre-operative protocol coverage. Patient was contacted 01/03/2019 in reference to pre-operative risk assessment for pending surgery as outlined below.  Richard Rivas was last seen on 12/09/18 by Christell Faith PA..  Since that day, Richard Rivas has done well without chest pain, recent stress test -nuclear stress test was without ischemia.  Pt meets 4 METS of activity with exertion.    Therefore, based on ACC/AHA guidelines, the patient would be at acceptable risk for the planned procedure without further cardiovascular testing.  Pt is now on 81 mg of ASA daily and Dr. Fletcher Anon recommends no interruption.     I will route this recommendation to the requesting party via Epic fax function and remove from pre-op pool.  Please call with questions.  Cecilie Kicks, NP 01/03/2019, 9:15 AM

## 2019-01-27 ENCOUNTER — Other Ambulatory Visit: Payer: Self-pay

## 2019-01-27 ENCOUNTER — Other Ambulatory Visit
Admission: RE | Admit: 2019-01-27 | Discharge: 2019-01-27 | Disposition: A | Discharge status: Home / Self Care | Payer: PPO | Source: Ambulatory Visit | Attending: Internal Medicine | Admitting: Internal Medicine

## 2019-01-27 DIAGNOSIS — Z01812 Encounter for preprocedural laboratory examination: Secondary | ICD-10-CM | POA: Diagnosis not present

## 2019-01-27 DIAGNOSIS — Z20828 Contact with and (suspected) exposure to other viral communicable diseases: Secondary | ICD-10-CM | POA: Insufficient documentation

## 2019-01-27 LAB — SARS CORONAVIRUS 2 (TAT 6-24 HRS): SARS Coronavirus 2: NEGATIVE

## 2019-01-28 ENCOUNTER — Encounter: Payer: Self-pay | Admitting: *Deleted

## 2019-01-30 NOTE — H&P (Signed)
  Outpatient short stay form Pre-procedure 01/30/2019 5:57 PM Teodoro K. Alice Reichert, M.D.  Primary Physician:  Emily Filbert, M.D.  Reason for visit:  History of colon polyps - adenoma - 10/11/2015.  History of present illness:  Patient is a pleasant 78 y/o male with hx of right colon polyps s/p right hemicolectomy in 2016. Last colonoscopy as above. Patient denies change in bowel habits, rectal bleeding, weight loss or abdominal pain.    No current facility-administered medications for this encounter.   Current Outpatient Medications:  .  acetaminophen (TYLENOL) 325 MG tablet, Take 2 tablets (650 mg total) by mouth every 6 (six) hours as needed for mild pain., Disp: , Rfl:  .  aspirin 81 MG tablet, Take 1 tablet (81 mg total) by mouth daily., Disp: , Rfl:  .  atorvastatin (LIPITOR) 80 MG tablet, TAKE 1 TABLET BY MOUTH EVERY DAY, Disp: , Rfl:  .  isosorbide mononitrate (IMDUR) 30 MG 24 hr tablet, Take 1 tablet (30 mg total) by mouth daily., Disp: 90 tablet, Rfl: 3 .  vitamin B-12 (CYANOCOBALAMIN) 1000 MCG tablet, Take 1,000 mcg by mouth daily as needed. , Disp: , Rfl:   No medications prior to admission.     Allergies  Allergen Reactions  . Bee Venom Anaphylaxis     Past Medical History:  Diagnosis Date  . BPPV (benign paroxysmal positional vertigo)   . Colon polyps   . Coronary artery disease    a.  Status post three-vessel CABG in 01/2017 with LIMA to LAD, SVG to LCx, SVG to PDA  . Depression   . GERD (gastroesophageal reflux disease)   . History of kidney stones   . Hypertension   . Kidney stones   . Myocardial infarction [facility])     Review of systems:  Otherwise negative.    Physical Exam  Gen: Alert, oriented. Appears stated age.  HEENT: [place]/AT. PERRLA. Lungs: CTA, no wheezes. CV: RR nl S1, S2. Abd: soft, benign, no masses. BS+ Ext: No edema. Pulses 2+    Planned procedures: Proceed with colonoscopy. The patient understands the nature of the planned procedure,  indications, risks, alternatives and potential complications including but not limited to bleeding, infection, perforation, damage to internal organs and possible oversedation/side effects from anesthesia. The patient agrees and gives consent to proceed.  Please refer to procedure notes for findings, recommendations and patient disposition/instructions.     Teodoro K. Alice Reichert, M.D. Gastroenterology 01/30/2019  5:57 PM

## 2019-01-31 ENCOUNTER — Encounter
Admission: RE | Disposition: A | Discharge status: Home / Self Care | Payer: Self-pay | Source: Home / Self Care | Attending: Internal Medicine

## 2019-01-31 ENCOUNTER — Ambulatory Visit
Admission: RE | Admit: 2019-01-31 | Discharge: 2019-01-31 | Disposition: A | Discharge status: Home / Self Care | Payer: PPO | Attending: Internal Medicine | Admitting: Internal Medicine

## 2019-01-31 ENCOUNTER — Ambulatory Visit: Payer: PPO | Admitting: Certified Registered"

## 2019-01-31 ENCOUNTER — Other Ambulatory Visit: Payer: Self-pay

## 2019-01-31 ENCOUNTER — Encounter: Payer: Self-pay | Admitting: Internal Medicine

## 2019-01-31 DIAGNOSIS — I10 Essential (primary) hypertension: Secondary | ICD-10-CM | POA: Insufficient documentation

## 2019-01-31 DIAGNOSIS — K640 First degree hemorrhoids: Secondary | ICD-10-CM | POA: Diagnosis not present

## 2019-01-31 DIAGNOSIS — K9189 Other postprocedural complications and disorders of digestive system: Secondary | ICD-10-CM | POA: Diagnosis not present

## 2019-01-31 DIAGNOSIS — Z86010 Personal history of colonic polyps: Secondary | ICD-10-CM | POA: Insufficient documentation

## 2019-01-31 DIAGNOSIS — Z980 Intestinal bypass and anastomosis status: Secondary | ICD-10-CM | POA: Insufficient documentation

## 2019-01-31 DIAGNOSIS — Z951 Presence of aortocoronary bypass graft: Secondary | ICD-10-CM | POA: Insufficient documentation

## 2019-01-31 DIAGNOSIS — Z9049 Acquired absence of other specified parts of digestive tract: Secondary | ICD-10-CM | POA: Insufficient documentation

## 2019-01-31 DIAGNOSIS — Z91030 Bee allergy status: Secondary | ICD-10-CM | POA: Diagnosis not present

## 2019-01-31 DIAGNOSIS — I2510 Atherosclerotic heart disease of native coronary artery without angina pectoris: Secondary | ICD-10-CM | POA: Insufficient documentation

## 2019-01-31 DIAGNOSIS — K648 Other hemorrhoids: Secondary | ICD-10-CM | POA: Diagnosis not present

## 2019-01-31 DIAGNOSIS — Z87891 Personal history of nicotine dependence: Secondary | ICD-10-CM | POA: Insufficient documentation

## 2019-01-31 DIAGNOSIS — Z7982 Long term (current) use of aspirin: Secondary | ICD-10-CM | POA: Diagnosis not present

## 2019-01-31 DIAGNOSIS — Z87892 Personal history of anaphylaxis: Secondary | ICD-10-CM | POA: Diagnosis not present

## 2019-01-31 DIAGNOSIS — K219 Gastro-esophageal reflux disease without esophagitis: Secondary | ICD-10-CM | POA: Diagnosis not present

## 2019-01-31 DIAGNOSIS — Z79899 Other long term (current) drug therapy: Secondary | ICD-10-CM | POA: Insufficient documentation

## 2019-01-31 DIAGNOSIS — I252 Old myocardial infarction: Secondary | ICD-10-CM | POA: Diagnosis not present

## 2019-01-31 DIAGNOSIS — Z1211 Encounter for screening for malignant neoplasm of colon: Secondary | ICD-10-CM | POA: Insufficient documentation

## 2019-01-31 HISTORY — PX: COLONOSCOPY WITH PROPOFOL: SHX5780

## 2019-01-31 SURGERY — COLONOSCOPY WITH PROPOFOL
Anesthesia: General

## 2019-01-31 MED ORDER — LIDOCAINE HCL (PF) 2 % IJ SOLN
INTRAMUSCULAR | Status: AC
  Filled 2019-01-31: qty 10

## 2019-01-31 MED ORDER — PROPOFOL 10 MG/ML IV BOLUS
INTRAVENOUS | Status: DC | PRN
  Administered 2019-01-31: 40 mg via INTRAVENOUS
  Administered 2019-01-31: 10 mg via INTRAVENOUS
  Administered 2019-01-31: 50 mg via INTRAVENOUS

## 2019-01-31 MED ORDER — PROPOFOL 10 MG/ML IV BOLUS
INTRAVENOUS | Status: AC
  Filled 2019-01-31: qty 40

## 2019-01-31 MED ORDER — PROPOFOL 500 MG/50ML IV EMUL
INTRAVENOUS | Status: DC | PRN
  Administered 2019-01-31: 120 ug/kg/min via INTRAVENOUS

## 2019-01-31 MED ORDER — SODIUM CHLORIDE 0.9 % IV SOLN
INTRAVENOUS | Status: DC
  Administered 2019-01-31: 14:00:00 via INTRAVENOUS

## 2019-01-31 MED ORDER — LIDOCAINE HCL (CARDIAC) PF 100 MG/5ML IV SOSY
PREFILLED_SYRINGE | INTRAVENOUS | Status: DC | PRN
  Administered 2019-01-31: 40 mg via INTRAVENOUS

## 2019-01-31 NOTE — Interval H&P Note (Signed)
History and Physical Interval Note:  01/31/2019 1:37 PM  Richard Rivas  has presented today for surgery, with the diagnosis of Hx Polyps.  The various methods of treatment have been discussed with the patient and family. After consideration of risks, benefits and other options for treatment, the patient has consented to  Procedure(s): COLONOSCOPY WITH PROPOFOL (N/A) as a surgical intervention.  The patient's history has been reviewed, patient examined, no change in status, stable for surgery.  I have reviewed the patient's chart and labs.  Questions were answered to the patient's satisfaction.     [place], [place]

## 2019-01-31 NOTE — Op Note (Signed)
[facility] Gastroenterology Patient Name: Richard Rivas Procedure Date: 01/31/2019 1:41 PM MRN: [id number] Account #: [id number] Date of Birth: 18-Feb-1941 Admit Type: Outpatient Age: 78 Room: [facility] ENDO ROOM 1 Gender: Male Note Status: Finalized Procedure:            Colonoscopy Indications:          High risk colon cancer surveillance: Personal history                        of non-advanced adenoma Providers:            Benay Pike. Alice Reichert MD, MD Referring MD:         Rusty Aus, MD (Referring MD) Medicines:            Propofol per Anesthesia Complications:        No immediate complications. Procedure:            Pre-Anesthesia Assessment:                       - Prior to the procedure, a History and Physical was                        performed, and patient medications, allergies and                        sensitivities were reviewed. The patient's tolerance of                        previous anesthesia was reviewed.                       - The risks and benefits of the procedure and the                        sedation options and risks were discussed with the                        patient. All questions were answered and informed                        consent was obtained.                       - Patient identification and proposed procedure were                        verified prior to the procedure by the nurse. The                        procedure was verified in the procedure room.                       - ASA Grade Assessment: III - A patient with severe                        systemic disease.                       - After reviewing the risks and benefits, the patient  was deemed in satisfactory condition to undergo the                        procedure.                       After obtaining informed consent, the colonoscope was                        passed under direct vision. Throughout the procedure,                        the  patient's blood pressure, pulse, and oxygen                        saturations were monitored continuously. The                        Colonoscope was introduced through the anus and                        advanced to the the ileocolonic anastomosis. The                        colonoscopy was somewhat difficult due to restricted                        mobility of the colon, significant looping and a                        tortuous colon. Successful completion of the procedure                        was aided by withdrawing and reinserting the scope and                        applying abdominal pressure. The patient tolerated the                        procedure well. The quality of the bowel preparation                        was good. The rectum and ileocolonic anastomosis were                        photographed. Findings:      The perianal and digital rectal examinations were normal. Pertinent       negatives include normal sphincter tone and no palpable rectal lesions.      There was evidence of a prior end-to-side ileo-colonic anastomosis in       the ascending colon. This was patent and was characterized by healthy       appearing mucosa. Estimated blood loss: none.      Non-bleeding internal hemorrhoids were found during retroflexion. The       hemorrhoids were Grade I (internal hemorrhoids that do not prolapse).      The exam was otherwise without abnormality. Impression:           - Patent end-to-side ileo-colonic anastomosis,  characterized by healthy appearing mucosa.                       - Non-bleeding internal hemorrhoids.                       - The examination was otherwise normal.                       - No specimens collected. Recommendation:       - Patient has a contact number available for                        emergencies. The signs and symptoms of potential                        delayed complications were discussed with the patient.                         Return to normal activities tomorrow. Written discharge                        instructions were provided to the patient.                       - Resume previous diet.                       - Continue present medications.                       - No repeat colonoscopy due to current age (25 years or                        older) and the absence of colonic polyps.                       - Return to GI office PRN.                       - The findings and recommendations were discussed with                        the patient. Procedure Code(s):    --- Professional ---                       [id number], Colorectal cancer screening; colonoscopy on                        individual at high risk Diagnosis Code(s):    --- Professional ---                       K64.0, First degree hemorrhoids                       Z98.0, Intestinal bypass and anastomosis status                       Z86.010, Personal history of colonic polyps CPT copyright 2019 American Medical Association. All rights reserved. The codes documented in this report are preliminary and upon coder review may  be revised to meet current compliance  requirements. Efrain Sella MD, MD 01/31/2019 2:09:35 PM This report has been signed electronically. Number of Addenda: 0 Note Initiated On: 01/31/2019 1:41 PM Scope Withdrawal Time: 0 hours 6 minutes 35 seconds  Total Procedure Duration: 0 hours 17 minutes 1 second  Estimated Blood Loss: Estimated blood loss: none.      [facility] [place]

## 2019-01-31 NOTE — Transfer of Care (Signed)
Immediate Anesthesia Transfer of Care Note  Patient: Richard Rivas  Procedure(s) Performed: COLONOSCOPY WITH PROPOFOL (N/A )  Patient Location: PACU  Anesthesia Type:General  Level of Consciousness: sedated  Airway & Oxygen Therapy: Patient Spontanous Breathing  Post-op Assessment: Report given to RN and Post -op Vital signs reviewed and stable  Post vital signs: Reviewed and stable  Last Vitals:  Vitals Value Taken Time  BP 105/60 01/31/19 1409  Temp 36.4 C 01/31/19 1409  Pulse 64 01/31/19 1410  Resp 16 01/31/19 1410  SpO2 96 % 01/31/19 1410  Vitals shown include unvalidated device data.  Last Pain:  Vitals:   01/31/19 1409  TempSrc: Tympanic  PainSc: Asleep         Complications: No apparent anesthesia complications

## 2019-01-31 NOTE — Anesthesia Post-op Follow-up Note (Signed)
Anesthesia QCDR form completed.        

## 2019-01-31 NOTE — Anesthesia Postprocedure Evaluation (Signed)
Anesthesia Post Note  Patient: Richard Rivas  Procedure(s) Performed: COLONOSCOPY WITH PROPOFOL (N/A )  Patient location during evaluation: Endoscopy Anesthesia Type: General Level of consciousness: awake and alert Pain management: pain level controlled Vital Signs Assessment: post-procedure vital signs reviewed and stable Respiratory status: spontaneous breathing and respiratory function stable Cardiovascular status: stable Anesthetic complications: no     Last Vitals:  Vitals:   01/31/19 1327 01/31/19 1409  BP: (!) 145/70 105/60  Pulse: 63   Resp: 16   Temp: 36.7 C 36.4 C  SpO2: 97%     Last Pain:  Vitals:   01/31/19 1409  TempSrc: Tympanic  PainSc: Asleep                 Gissell Barra K

## 2019-01-31 NOTE — Anesthesia Preprocedure Evaluation (Signed)
Anesthesia Evaluation  Patient identified by MRN, date of birth, ID band Patient awake    Reviewed: Allergy & Precautions, NPO status , Patient's Chart, lab work & pertinent test results  History of Anesthesia Complications Negative for: history of anesthetic complications  Airway Mallampati: III       Dental   Pulmonary neg sleep apnea, neg COPD, former smoker,           Cardiovascular hypertension, Pt. on medications + Past MI and + CABG  (-) CHF (-) dysrhythmias (-) Valvular Problems/Murmurs     Neuro/Psych neg Seizures Depression    GI/Hepatic Neg liver ROS, neg GERD  ,  Endo/Other  neg diabetes  Renal/GU Renal disease (stones)     Musculoskeletal   Abdominal   Peds  Hematology   Anesthesia Other Findings   Reproductive/Obstetrics                             Anesthesia Physical Anesthesia Plan  ASA: III  Anesthesia Plan: General   Post-op Pain Management:    Induction: Intravenous  PONV Risk Score and Plan: Propofol infusion and TIVA  Airway Management Planned: Nasal Cannula  Additional Equipment:   Intra-op Plan:   Post-operative Plan:   Informed Consent: I have reviewed the patients History and Physical, chart, labs and discussed the procedure including the risks, benefits and alternatives for the proposed anesthesia with the patient or authorized representative who has indicated his/her understanding and acceptance.       Plan Discussed with:   Anesthesia Plan Comments:         Anesthesia Quick Evaluation

## 2019-03-25 ENCOUNTER — Other Ambulatory Visit: Payer: Self-pay

## 2019-03-25 DIAGNOSIS — E538 Deficiency of other specified B group vitamins: Secondary | ICD-10-CM | POA: Diagnosis not present

## 2019-03-25 DIAGNOSIS — Z20822 Contact with and (suspected) exposure to covid-19: Secondary | ICD-10-CM

## 2019-03-25 DIAGNOSIS — E782 Mixed hyperlipidemia: Secondary | ICD-10-CM | POA: Diagnosis not present

## 2019-03-27 LAB — NOVEL CORONAVIRUS, NAA: SARS-CoV-2, NAA: NOT DETECTED

## 2019-04-01 DIAGNOSIS — E538 Deficiency of other specified B group vitamins: Secondary | ICD-10-CM | POA: Diagnosis not present

## 2019-04-01 DIAGNOSIS — E782 Mixed hyperlipidemia: Secondary | ICD-10-CM | POA: Diagnosis not present

## 2019-04-01 DIAGNOSIS — I25810 Atherosclerosis of coronary artery bypass graft(s) without angina pectoris: Secondary | ICD-10-CM | POA: Diagnosis not present

## 2019-04-01 DIAGNOSIS — Z125 Encounter for screening for malignant neoplasm of prostate: Secondary | ICD-10-CM | POA: Diagnosis not present

## 2019-07-15 ENCOUNTER — Ambulatory Visit: Payer: PPO | Attending: Internal Medicine

## 2019-07-15 DIAGNOSIS — Z23 Encounter for immunization: Secondary | ICD-10-CM

## 2019-07-15 NOTE — Progress Notes (Signed)
   Covid-19 Vaccination Clinic  Name:  Richard Rivas    MRN: [id number] DOB: [birth year]  07/15/2019  [name] was observed post Covid-19 immunization for 15 minutes without incidence. He was provided with Vaccine Information Sheet and instruction to access the V-Safe system.   [name] was instructed to call 911 with any severe reactions post vaccine: Marland Kitchen Difficulty breathing  . Swelling of your face and throat  . A fast heartbeat  . A bad rash all over your body  . Dizziness and weakness    Immunizations Administered    Name Date Dose VIS Date Route   Pfizer COVID-19 Vaccine 07/15/2019  9:15 AM 0.3 mL 05/13/2019 Intramuscular   Manufacturer: [place]   Lot: [id number]   [place]: [id number]

## 2019-07-20 DIAGNOSIS — H40003 Preglaucoma, unspecified, bilateral: Secondary | ICD-10-CM | POA: Diagnosis not present

## 2019-07-20 DIAGNOSIS — H3554 Dystrophies primarily involving the retinal pigment epithelium: Secondary | ICD-10-CM | POA: Diagnosis not present

## 2019-07-25 DIAGNOSIS — H3554 Dystrophies primarily involving the retinal pigment epithelium: Secondary | ICD-10-CM | POA: Diagnosis not present

## 2019-08-10 ENCOUNTER — Ambulatory Visit: Payer: PPO | Attending: Internal Medicine

## 2019-08-10 DIAGNOSIS — Z23 Encounter for immunization: Secondary | ICD-10-CM | POA: Insufficient documentation

## 2019-08-10 NOTE — Progress Notes (Signed)
   Covid-19 Vaccination Clinic  Name:  Richard Rivas    MRN: [id number] DOB: 11-05-1940  08/10/2019  Mr. Richard Rivas was observed post Covid-19 immunization for 15 minutes without incident. He was provided with Vaccine Information Sheet and instruction to access the V-Safe system.   Mr. Richard Rivas was instructed to call 911 with any severe reactions post vaccine: Marland Kitchen Difficulty breathing  . Swelling of face and throat  . A fast heartbeat  . A bad rash all over body  . Dizziness and weakness   Immunizations Administered    Name Date Dose VIS Date Route   Pfizer COVID-19 Vaccine 08/10/2019  8:16 AM 0.3 mL 05/13/2019 Intramuscular   Manufacturer: [place]   Lot: [id number]   [place]: [id number]

## 2019-09-27 DIAGNOSIS — E538 Deficiency of other specified B group vitamins: Secondary | ICD-10-CM | POA: Diagnosis not present

## 2019-09-27 DIAGNOSIS — Z125 Encounter for screening for malignant neoplasm of prostate: Secondary | ICD-10-CM | POA: Diagnosis not present

## 2019-09-27 DIAGNOSIS — E782 Mixed hyperlipidemia: Secondary | ICD-10-CM | POA: Diagnosis not present

## 2019-10-04 DIAGNOSIS — E782 Mixed hyperlipidemia: Secondary | ICD-10-CM | POA: Diagnosis not present

## 2019-10-04 DIAGNOSIS — E538 Deficiency of other specified B group vitamins: Secondary | ICD-10-CM | POA: Diagnosis not present

## 2019-10-04 DIAGNOSIS — Z0000 Encounter for general adult medical examination without abnormal findings: Secondary | ICD-10-CM | POA: Diagnosis not present

## 2019-10-04 DIAGNOSIS — I700 Atherosclerosis of aorta: Secondary | ICD-10-CM | POA: Diagnosis not present

## 2019-10-18 DIAGNOSIS — H40003 Preglaucoma, unspecified, bilateral: Secondary | ICD-10-CM | POA: Diagnosis not present

## 2019-10-24 NOTE — Progress Notes (Signed)
Office Visit    Patient Name: Richard Rivas Date of Encounter: 10/26/2019  Primary Care Provider:  Rusty Aus, MD Primary Cardiologist:  Kathlyn Sacramento, MD Electrophysiologist:  None   Chief Complaint    Richard Rivas is a 79 y.o. 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   Past Medical History    Past Medical History:  Diagnosis Date  . BPPV (benign paroxysmal positional vertigo)   . Colon polyps   . Coronary artery disease    a.  Status post three-vessel CABG in 01/2017 with LIMA to LAD, SVG to LCx, SVG to PDA  . Depression   . GERD (gastroesophageal reflux disease)   . History of kidney stones   . Hypertension   . Kidney stones   . Myocardial infarction [facility])    Past Surgical History:  Procedure Laterality Date  . CARDIAC CATHETERIZATION    . CERVICAL DISC SURGERY    . COLON SURGERY    . COLONOSCOPY WITH PROPOFOL N/A 10/11/2015   Procedure: COLONOSCOPY WITH PROPOFOL;  Surgeon: Manya Silvas, MD;  Location: [facility] ENDOSCOPY;  Service: Endoscopy;  Laterality: N/A;  . COLONOSCOPY WITH PROPOFOL N/A 01/31/2019   Procedure: COLONOSCOPY WITH PROPOFOL;  Surgeon: Toledo, Benay Pike, MD;  Location: [facility] ENDOSCOPY;  Service: Gastroenterology;  Laterality: N/A;  . CORONARY ARTERY BYPASS GRAFT N/A 02/13/2017   Procedure: CORONARY ARTERY BYPASS GRAFTING (CABG) x3 (LIMA to LAD, SVG to CIRCUMFLEX , SVG to PDA )  , using left internal mammary artery and right leg greater saphenous vein harvested endoscopically;  Surgeon: [name], MD;  Location: [place];  Service: Open Heart Surgery;  Laterality: N/A;  . KIDNEY STONE SURGERY    . LAPAROSCOPIC RIGHT COLECTOMY Right 11/14/2014   Procedure: LAPAROSCOPIC RIGHT COLECTOMY;  Surgeon: Leonie Green, MD;  Location: [facility] ORS;  Service: General;  Laterality: Right;  . LEFT HEART CATH AND CORONARY ANGIOGRAPHY  N/A 02/12/2017   Procedure: LEFT HEART CATH AND CORONARY ANGIOGRAPHY and possible pci;  Surgeon: Yolonda Kida, MD;  Location: [address] CV LAB;  Service: Cardiovascular;  Laterality: N/A;  . SHOULDER ARTHROSCOPY W/ ROTATOR CUFF REPAIR Right   . TEE WITHOUT CARDIOVERSION N/A 02/13/2017   Procedure: TRANSESOPHAGEAL ECHOCARDIOGRAM (TEE);  Surgeon: [name], MD;  Location: [place];  Service: Open Heart Surgery;  Laterality: N/A;    Allergies  Allergies  Allergen Reactions  . Bee Venom Anaphylaxis    History of Present Illness    Richard Rivas is a 79 y.o. male with a hx of CAD with small NSTEMI 01/2017 s/p CABGx3, ectatic aorta, HTN, prior tobacco use, nephrolithiasis, BPPV, depression, GERD. He was last seen 12/09/18 by Christell Faith, PA.  Admitted 01/2017 with small NSTEMI. Cardiac cath showed severe distal LM stenosis at ostium of LAD and LCx with mild RCA disease. EF was normal. Underwent CABGx2 (LIMA-LAD, SVG-LCx, SVG-PDA). Pre bypass carotid duplex 01/2017 with bilateral 1-30% ICA stenosis with antegrade vertebral flow. Preoperative ABIs were normal with right side ABI 1.06 and left sided ABI 1.02. Underwent aortic ultrasound 11/12/18 with aortoiliac atherosclerosis with ectasia and maximally aortic diameter of 2.8cm and maximal left iliac diameter 1.9cm followed by PCP.   When seen in clinic 12/2018 noted intermitted chest pain that felt different than his anginal equivalent and was tender to palpation. He was recommended for [facility] and  Imdur 30mg  daily. Lexiscan Myoview 12/2018 was low risk with no significant ischemia and EF estimated at 70%.  Of note, not maintained on beta blocker due to heart rate typically in the 60s.   Present today with his wife. Reports feeling well. Stays active by exercising daily and working in the yard. Endorses eating a low sodium, heart healthy diet.   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  Reports no  shortness of breath nor dyspnea on exertion. Reports no chest pain, pressure, or tightness. No edema, orthopnea, PND. Reports no palpitations.   EKGs/Labs/Other Studies Reviewed:   The following studies were reviewed today:  Lexiscan Myoview 12/2018 Pharmacological myocardial perfusion imaging study with no significant  ischemia Normal wall motion, EF estimated at 70% No EKG changes concerning for ischemia at peak stress or in recovery. Low risk scan  EKG:  EKG is  ordered today.  The ekg ordered today demonstrates SR 64 bpm with sinus arrhythmia and no acute ST/T wave changes.  Recent Labs: No results found for requested labs within last 8760 hours.  Recent Lipid Panel    Component Value Date/Time   CHOL 150 02/12/2017 0527   TRIG 102 02/12/2017 0527   HDL 43 02/12/2017 0527   CHOLHDL 3.5 02/12/2017 0527   VLDL 20 02/12/2017 0527   LDLCALC 87 02/12/2017 0527    Home Medications   Current Meds  Medication Sig  . acetaminophen (TYLENOL) 325 MG tablet Take 2 tablets (650 mg total) by mouth every 6 (six) hours as needed for mild pain.  Marland Kitchen atorvastatin (LIPITOR) 80 MG tablet TAKE 1 TABLET BY MOUTH EVERY DAY  . isosorbide mononitrate (IMDUR) 30 MG 24 hr tablet Take 1 tablet (30 mg total) by mouth daily.  . vitamin B-12 (CYANOCOBALAMIN) 1000 MCG tablet Take 1,000 mcg by mouth daily as needed.   . [DISCONTINUED] aspirin 325 MG tablet Take 325 mg by mouth daily.    Review of Systems    Review of Systems  Constitution: Negative for chills, fever and malaise/fatigue.  Cardiovascular: Negative for chest pain, dyspnea on exertion, leg swelling, near-syncope, orthopnea, palpitations and syncope.  Respiratory: Negative for cough, shortness of breath and wheezing.   Gastrointestinal: Negative for nausea and vomiting.  Neurological: Negative for dizziness, light-headedness and weakness.   All other systems reviewed and are otherwise negative except as noted above.  Physical Exam      VS:  BP 136/62 (BP Location: Left Arm, Patient Position: Sitting, Cuff Size: Normal)   Pulse 64   Ht 5\' 8"  (1.727 m)   Wt 179 lb (81.2 kg)   SpO2 96%   BMI 27.22 kg/m  , BMI Body mass index is 27.22 kg/m. GEN: Well nourished, well developed, in no acute distress. HEENT: normal. Neck: Supple, no JVD, carotid bruits, or masses. Cardiac: RRR, no murmurs, rubs, or gallops. No clubbing, cyanosis, edema.  Radials/PT 2+ and equal bilaterally.  Respiratory:  Respirations regular and unlabored, clear to auscultation bilaterally. GI: Soft, nontender, nondistended. MS: No deformity or atrophy. Skin: Warm and dry, no rash. Neuro:  Strength and sensation are intact. Psych: Normal affect.  Assessment & Plan    1. CAD s/p CABG - CABGx3 2018. Low risk Lexiscan Myoview 12/2018. Has been taking Aspirin 325mg  daily since CABG. Reduce Aspirin 81mg  daily. Continue Atorvastatin 80mg  daily. No betablocker secondary to baseline HR in the 60s. EKG today SR with no acute ST/T wave changes. No anginal symptoms,  no indication for ischemia evaluation. He is deemed appropriate for renewal of CDL with no need for additional testing. Letter provided for him him clinic today.   2. HLD, LDL goal <70 - Lipid panel 09/27/19 with primary care with LDL 46 and HDL 47.2. Lipids at goal on current medical regimen. Continue Atorvastatin 80mg  daily.   3. Ectatic aorta - Follows with PCP. Duplex 11/2018 with recommendation for repeat ultrasound in 5 years. Continue optimal BP and lipid control.   4. HTN - BP well controlled.   Disposition: Follow up in 1 year(s) with Dr. Fletcher Anon.    Loel Dubonnet, NP 10/26/2019, 1:14 PM

## 2019-10-25 DIAGNOSIS — H3554 Dystrophies primarily involving the retinal pigment epithelium: Secondary | ICD-10-CM | POA: Diagnosis not present

## 2019-10-26 ENCOUNTER — Other Ambulatory Visit: Payer: Self-pay

## 2019-10-26 ENCOUNTER — Encounter: Payer: Self-pay | Admitting: Family

## 2019-10-26 ENCOUNTER — Ambulatory Visit: Payer: PPO | Admitting: Family

## 2019-10-26 VITALS — BP 136/62 | HR 64 | Ht 68.0 in | Wt 179.0 lb

## 2019-10-26 DIAGNOSIS — I77819 Aortic ectasia, unspecified site: Secondary | ICD-10-CM | POA: Diagnosis not present

## 2019-10-26 DIAGNOSIS — I2510 Atherosclerotic heart disease of native coronary artery without angina pectoris: Secondary | ICD-10-CM

## 2019-10-26 DIAGNOSIS — E785 Hyperlipidemia, unspecified: Secondary | ICD-10-CM

## 2019-10-26 DIAGNOSIS — J0100 Acute maxillary sinusitis, unspecified: Secondary | ICD-10-CM | POA: Diagnosis not present

## 2019-10-26 MED ORDER — ASPIRIN EC 81 MG PO TBEC: 81.0000 mg | DELAYED_RELEASE_TABLET | Freq: Every day | ORAL | 3 refills | Status: DC

## 2019-10-26 NOTE — Patient Instructions (Signed)
Medication Instructions:  Your physician has recommended you make the following change in your medication: DECREASE YOUR ASPIRIN TO 81 MG DAILY   *If you need a refill on your cardiac medications before your next appointment, please call your pharmacy*   Lab Work: None ordered this visit  If you have labs (blood work) drawn today and your tests are completely normal, you will receive your results only by: Marland Kitchen MyChart Message (if you have MyChart) OR . A paper copy in the mail If you have any lab test that is abnormal or we need to change your treatment, we will call you to review the results.   Testing/Procedures: None ordered this visit    Follow-Up: At [facility], you and your health needs are our priority.  As part of our continuing mission to provide you with exceptional heart care, we have created designated Provider Care Teams.  These Care Teams include your primary Cardiologist (physician) and Advanced Practice Providers (APPs -  Physician Assistants and Nurse Practitioners) who all work together to provide you with the care you need, when you need it.  We recommend signing up for the patient portal called "MyChart".  Sign up information is provided on this After Visit Summary.  MyChart is used to connect with patients for Virtual Visits (Telemedicine).  Patients are able to view lab/test results, encounter notes, upcoming appointments, etc.  Non-urgent messages can be sent to your provider as well.   To learn more about what you can do with MyChart, go to [email or web address].    Your next appointment:   1 year(s)  The format for your next appointment:   In Person  Provider:   Kathlyn Sacramento, MD   Other Instructions

## 2020-01-04 DIAGNOSIS — I25738 Atherosclerosis of nonautologous biological coronary artery bypass graft(s) with other forms of angina pectoris: Secondary | ICD-10-CM | POA: Diagnosis not present

## 2020-01-04 DIAGNOSIS — F330 Major depressive disorder, recurrent, mild: Secondary | ICD-10-CM | POA: Diagnosis not present

## 2020-01-04 DIAGNOSIS — F329 Major depressive disorder, single episode, unspecified: Secondary | ICD-10-CM | POA: Diagnosis not present

## 2020-01-04 DIAGNOSIS — R5383 Other fatigue: Secondary | ICD-10-CM | POA: Diagnosis not present

## 2020-04-02 DIAGNOSIS — E538 Deficiency of other specified B group vitamins: Secondary | ICD-10-CM | POA: Diagnosis not present

## 2020-04-02 DIAGNOSIS — E782 Mixed hyperlipidemia: Secondary | ICD-10-CM | POA: Diagnosis not present

## 2020-04-09 DIAGNOSIS — Z23 Encounter for immunization: Secondary | ICD-10-CM | POA: Diagnosis not present

## 2020-04-09 DIAGNOSIS — I700 Atherosclerosis of aorta: Secondary | ICD-10-CM | POA: Diagnosis not present

## 2020-04-09 DIAGNOSIS — E538 Deficiency of other specified B group vitamins: Secondary | ICD-10-CM | POA: Diagnosis not present

## 2020-04-09 DIAGNOSIS — E782 Mixed hyperlipidemia: Secondary | ICD-10-CM | POA: Diagnosis not present

## 2020-04-09 DIAGNOSIS — Z125 Encounter for screening for malignant neoplasm of prostate: Secondary | ICD-10-CM | POA: Diagnosis not present

## 2020-04-23 DIAGNOSIS — H353132 Nonexudative age-related macular degeneration, bilateral, intermediate dry stage: Secondary | ICD-10-CM | POA: Diagnosis not present

## 2020-07-27 ENCOUNTER — Other Ambulatory Visit: Payer: Self-pay | Admitting: Internal Medicine

## 2020-07-27 DIAGNOSIS — R1902 Left upper quadrant abdominal swelling, mass and lump: Secondary | ICD-10-CM | POA: Diagnosis not present

## 2020-07-27 DIAGNOSIS — H5712 Ocular pain, left eye: Secondary | ICD-10-CM | POA: Diagnosis not present

## 2020-07-27 DIAGNOSIS — H02055 Trichiasis without entropian left lower eyelid: Secondary | ICD-10-CM | POA: Diagnosis not present

## 2020-08-03 ENCOUNTER — Ambulatory Visit
Admission: RE | Admit: 2020-08-03 | Discharge: 2020-08-03 | Disposition: A | Discharge status: Home / Self Care | Payer: PPO | Source: Ambulatory Visit | Attending: Internal Medicine | Admitting: Internal Medicine

## 2020-08-03 ENCOUNTER — Other Ambulatory Visit: Payer: Self-pay

## 2020-08-03 DIAGNOSIS — R1902 Left upper quadrant abdominal swelling, mass and lump: Secondary | ICD-10-CM | POA: Diagnosis not present

## 2020-08-03 DIAGNOSIS — R109 Unspecified abdominal pain: Secondary | ICD-10-CM | POA: Diagnosis not present

## 2020-08-03 LAB — POCT I-STAT CREATININE: Creatinine, Ser: 1.1 mg/dL (ref 0.61–1.24)

## 2020-08-03 MED ORDER — IOHEXOL 300 MG/ML  SOLN
100.0000 mL | Freq: Once | INTRAMUSCULAR | Status: AC | PRN
  Administered 2020-08-03: 100 mL via INTRAVENOUS

## 2020-08-09 DIAGNOSIS — R10812 Left upper quadrant abdominal tenderness: Secondary | ICD-10-CM | POA: Diagnosis not present

## 2020-08-09 DIAGNOSIS — I739 Peripheral vascular disease, unspecified: Secondary | ICD-10-CM | POA: Diagnosis not present

## 2020-09-03 ENCOUNTER — Other Ambulatory Visit (INDEPENDENT_AMBULATORY_CARE_PROVIDER_SITE_OTHER): Payer: Self-pay | Admitting: Vascular Surgery

## 2020-09-03 DIAGNOSIS — I739 Peripheral vascular disease, unspecified: Secondary | ICD-10-CM

## 2020-09-04 ENCOUNTER — Ambulatory Visit (INDEPENDENT_AMBULATORY_CARE_PROVIDER_SITE_OTHER): Payer: PPO | Admitting: Vascular Surgery

## 2020-09-04 ENCOUNTER — Other Ambulatory Visit: Payer: Self-pay

## 2020-09-04 ENCOUNTER — Encounter (INDEPENDENT_AMBULATORY_CARE_PROVIDER_SITE_OTHER): Payer: Self-pay | Admitting: Vascular Surgery

## 2020-09-04 ENCOUNTER — Ambulatory Visit (INDEPENDENT_AMBULATORY_CARE_PROVIDER_SITE_OTHER): Payer: PPO

## 2020-09-04 VITALS — BP 122/67 | HR 68 | Resp 16 | Ht 68.0 in | Wt 176.6 lb

## 2020-09-04 DIAGNOSIS — E782 Mixed hyperlipidemia: Secondary | ICD-10-CM | POA: Diagnosis not present

## 2020-09-04 DIAGNOSIS — I739 Peripheral vascular disease, unspecified: Secondary | ICD-10-CM

## 2020-09-04 DIAGNOSIS — R29898 Other symptoms and signs involving the musculoskeletal system: Secondary | ICD-10-CM | POA: Insufficient documentation

## 2020-09-04 DIAGNOSIS — N200 Calculus of kidney: Secondary | ICD-10-CM | POA: Insufficient documentation

## 2020-09-04 NOTE — Progress Notes (Signed)
Patient ID: Richard Rivas, male   DOB: [birth year], 80 y.o.   MRN: [id number]  Chief Complaint  Patient presents with  . New Patient (Initial Visit)    Ref Sabra Heck claudication/pvd    HPI Richard Rivas is a 80 y.o. male.  I am asked to see the patient by Dr. Sabra Heck for evaluation of right leg numbness and weakness. He describes his right leg essentially giving out on him when he stands for long periods of time or walks.  He says it does not overtly hurt, but the leg essentially just becomes weak and gives way.  This is happened multiple times.  This has been over the last several months and seems to be getting worse.  No left leg symptoms.  No ulceration or infection. We did ABIs today to assess for arterial insufficiency.  His ABIs were 1.17 on the right and 1.11 on the left with brisk triphasic waveforms and normal digital pressures consistent with no arterial insufficiency.      Past Medical History:  Diagnosis Date  . BPPV (benign paroxysmal positional vertigo)   . Colon polyps   . Coronary artery disease    a.  Status post three-vessel CABG in 01/2017 with LIMA to LAD, SVG to LCx, SVG to PDA  . Depression   . GERD (gastroesophageal reflux disease)   . History of kidney stones   . Hypertension   . Kidney stones   . Myocardial infarction [facility])     Past Surgical History:  Procedure Laterality Date  . CARDIAC CATHETERIZATION    . CERVICAL DISC SURGERY    . COLON SURGERY    . COLONOSCOPY WITH PROPOFOL N/A 10/11/2015   Procedure: COLONOSCOPY WITH PROPOFOL;  Surgeon: Manya Silvas, MD;  Location: [facility] ENDOSCOPY;  Service: Endoscopy;  Laterality: N/A;  . COLONOSCOPY WITH PROPOFOL N/A 01/31/2019   Procedure: COLONOSCOPY WITH PROPOFOL;  Surgeon: Toledo, Benay Pike, MD;  Location: [facility] ENDOSCOPY;  Service: Gastroenterology;  Laterality: N/A;  . CORONARY ARTERY BYPASS GRAFT N/A 02/13/2017   Procedure: CORONARY ARTERY BYPASS GRAFTING (CABG) x3 (LIMA to LAD, SVG to CIRCUMFLEX , SVG to PDA )   , using left internal mammary artery and right leg greater saphenous vein harvested endoscopically;  Surgeon: [name], MD;  Location: [place];  Service: Open Heart Surgery;  Laterality: N/A;  . KIDNEY STONE SURGERY    . LAPAROSCOPIC RIGHT COLECTOMY Right 11/14/2014   Procedure: LAPAROSCOPIC RIGHT COLECTOMY;  Surgeon: Leonie Green, MD;  Location: [facility] ORS;  Service: General;  Laterality: Right;  . LEFT HEART CATH AND CORONARY ANGIOGRAPHY N/A 02/12/2017   Procedure: LEFT HEART CATH AND CORONARY ANGIOGRAPHY and possible pci;  Surgeon: Yolonda Kida, MD;  Location: [place] CV LAB;  Service: Cardiovascular;  Laterality: N/A;  . SHOULDER ARTHROSCOPY W/ ROTATOR CUFF REPAIR Right   . TEE WITHOUT CARDIOVERSION N/A 02/13/2017   Procedure: TRANSESOPHAGEAL ECHOCARDIOGRAM (TEE);  Surgeon: [name], MD;  Location: [place];  Service: Open Heart Surgery;  Laterality: N/A;     Family History  Problem Relation Age of Onset  . Heart attack Father   . Heart attack Brother      Social History   Tobacco Use  . Smoking status: Former Smoker    Years: 40.00    Types: Cigarettes    Quit date: 11/06/1994    Years since quitting: 25.8  . Smokeless tobacco: Former Systems developer    Quit date: 11/05/2012  Vaping Use  .  Vaping Use: Never used  Substance Use Topics  . Alcohol use: Yes    Comment: beer occasionally  . Drug use: No    Allergies  Allergen Reactions  . Bee Venom Anaphylaxis    Current Outpatient Medications  Medication Sig Dispense Refill  . acetaminophen (TYLENOL) 325 MG tablet Take 2 tablets (650 mg total) by mouth every 6 (six) hours as needed for mild pain.    Marland Kitchen aspirin EC 81 MG tablet Take 1 tablet (81 mg total) by mouth daily. 100 tablet 3  . atorvastatin (LIPITOR) 80 MG tablet TAKE 1 TABLET BY MOUTH EVERY DAY    . isosorbide mononitrate (IMDUR) 30 MG 24 hr tablet Take 1 tablet (30 mg total) by mouth daily. 90 tablet 3  . sertraline (ZOLOFT) 50 MG tablet Take 1 tablet  by mouth daily.    . vitamin B-12 (CYANOCOBALAMIN) 1000 MCG tablet Take 1,000 mcg by mouth daily as needed.      No current facility-administered medications for this visit.      REVIEW OF SYSTEMS (Negative unless checked)  Constitutional: [] Weight loss  [] Fever  [] Chills Cardiac: [] Chest pain   [] Chest pressure   [] Palpitations   [] Shortness of breath when laying flat   [] Shortness of breath at rest   [] Shortness of breath with exertion. Vascular:  [] Pain in legs with walking   [] Pain in legs at rest   [] Pain in legs when laying flat   [] Claudication   [] Pain in feet when walking  [] Pain in feet at rest  [] Pain in feet when laying flat   [] History of DVT   [] Phlebitis   [] Swelling in legs   [] Varicose veins   [] Non-healing ulcers Pulmonary:   [] Uses home oxygen   [] Productive cough   [] Hemoptysis   [] Wheeze  [] COPD   [] Asthma Neurologic:  [] Dizziness  [] Blackouts   [] Seizures   [] History of stroke   [] History of TIA  [] Aphasia   [] Temporary blindness   [] Dysphagia   [] Weakness or numbness in arms   [x] Weakness or numbness in legs Musculoskeletal:  [x] Arthritis   [] Joint swelling   [] Joint pain   [] Low back pain Hematologic:  [] Easy bruising  [] Easy bleeding   [] Hypercoagulable state   [] Anemic  [] Hepatitis Gastrointestinal:  [] Blood in stool   [] Vomiting blood  [x] Gastroesophageal reflux/heartburn   [] Abdominal pain Genitourinary:  [] Chronic kidney disease   [] Difficult urination  [] Frequent urination  [] Burning with urination   [] Hematuria Skin:  [] Rashes   [] Ulcers   [] Wounds Psychological:  [] History of anxiety   [x]  History of major depression.    Physical Exam BP 122/67 (BP Location: Right Arm)   Pulse 68   Resp 16   Ht 5\' 8"  (1.727 m)   Wt 176 lb 9.6 oz (80.1 kg)   BMI 26.85 kg/m  Gen:  WD/WN, NAD Head: [place]/AT, No temporalis wasting.  Ear/Nose/Throat: Hearing grossly intact, nares w/o erythema or drainage, oropharynx w/o Erythema/Exudate Eyes: Conjunctiva clear, sclera  non-icteric  Neck: trachea midline.  No JVD.  Pulmonary:  Good air movement, respirations not labored, no use of accessory muscles  Cardiac: RRR, no JVD Vascular:  Vessel Right Left  Radial Palpable Palpable                          DP palpable palpable  PT palpable palpable   Gastrointestinal:. No masses, surgical incisions, or scars. Musculoskeletal: M/S 5/5 throughout.  Extremities without ischemic changes.  No deformity or atrophy.  No edema. Neurologic: Sensation grossly intact in extremities.  Symmetrical.  Speech is fluent. Motor exam as listed above. Psychiatric: Judgment intact, Mood & affect appropriate for pt's clinical situation. Dermatologic: No rashes or ulcers noted.  No cellulitis or open wounds.    Radiology No results found.  Labs Recent Results (from the past 2160 hour(s))  I-STAT creatinine     Status: None   Collection Time: 08/03/20 10:28 AM  Result Value Ref Range   Creatinine, Ser 1.10 0.61 - 1.24 mg/dL    Assessment/Plan:  Hyperlipidemia, mixed lipid control important in reducing the progression of atherosclerotic disease. Continue statin therapy   Weakness of lower extremity The patient has weakness and numbness in the right leg with standing or activity.  We did ABIs today to assess for arterial insufficiency.  His ABIs were 1.17 on the right and 1.11 on the left with brisk triphasic waveforms and normal digital pressures consistent with no arterial insufficiency.  Perfusion does not appear to be the cause of his lower extremity symptoms.  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  I will defer further work-up to his primary care physician I will see him back as needed.      Leotis Pain 09/04/2020, 3:42 PM   This note was created with Dragon medical transcription system.  Any errors from dictation are unintentional.

## 2020-09-04 NOTE — Assessment & Plan Note (Signed)
The patient has weakness and numbness in the right leg with standing or activity.  We did ABIs today to assess for arterial insufficiency.  His ABIs were 1.17 on the right and 1.11 on the left with brisk triphasic waveforms and normal digital pressures consistent with no arterial insufficiency.  Perfusion does not appear to be the cause of his lower extremity symptoms.  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  I will defer further work-up to his primary care physician I will see him back as needed.

## 2020-09-04 NOTE — Assessment & Plan Note (Signed)
lipid control important in reducing the progression of atherosclerotic disease. Continue statin therapy  

## 2020-09-12 DIAGNOSIS — H02055 Trichiasis without entropian left lower eyelid: Secondary | ICD-10-CM | POA: Diagnosis not present

## 2020-10-01 DIAGNOSIS — Z125 Encounter for screening for malignant neoplasm of prostate: Secondary | ICD-10-CM | POA: Diagnosis not present

## 2020-10-01 DIAGNOSIS — E538 Deficiency of other specified B group vitamins: Secondary | ICD-10-CM | POA: Diagnosis not present

## 2020-10-01 DIAGNOSIS — E782 Mixed hyperlipidemia: Secondary | ICD-10-CM | POA: Diagnosis not present

## 2020-10-08 DIAGNOSIS — M5116 Intervertebral disc disorders with radiculopathy, lumbar region: Secondary | ICD-10-CM | POA: Diagnosis not present

## 2020-10-08 DIAGNOSIS — M533 Sacrococcygeal disorders, not elsewhere classified: Secondary | ICD-10-CM | POA: Diagnosis not present

## 2020-10-08 DIAGNOSIS — M47816 Spondylosis without myelopathy or radiculopathy, lumbar region: Secondary | ICD-10-CM | POA: Diagnosis not present

## 2020-10-08 DIAGNOSIS — M4697 Unspecified inflammatory spondylopathy, lumbosacral region: Secondary | ICD-10-CM | POA: Diagnosis not present

## 2020-10-08 DIAGNOSIS — E782 Mixed hyperlipidemia: Secondary | ICD-10-CM | POA: Diagnosis not present

## 2020-10-08 DIAGNOSIS — F330 Major depressive disorder, recurrent, mild: Secondary | ICD-10-CM | POA: Diagnosis not present

## 2020-10-08 DIAGNOSIS — E538 Deficiency of other specified B group vitamins: Secondary | ICD-10-CM | POA: Diagnosis not present

## 2020-10-08 DIAGNOSIS — I700 Atherosclerosis of aorta: Secondary | ICD-10-CM | POA: Diagnosis not present

## 2020-10-08 DIAGNOSIS — Z0000 Encounter for general adult medical examination without abnormal findings: Secondary | ICD-10-CM | POA: Diagnosis not present

## 2020-10-11 DIAGNOSIS — H02055 Trichiasis without entropian left lower eyelid: Secondary | ICD-10-CM | POA: Diagnosis not present

## 2020-10-11 DIAGNOSIS — H0102A Squamous blepharitis right eye, upper and lower eyelids: Secondary | ICD-10-CM | POA: Diagnosis not present

## 2020-10-22 DIAGNOSIS — I25738 Atherosclerosis of nonautologous biological coronary artery bypass graft(s) with other forms of angina pectoris: Secondary | ICD-10-CM | POA: Diagnosis not present

## 2020-10-22 DIAGNOSIS — E538 Deficiency of other specified B group vitamins: Secondary | ICD-10-CM | POA: Diagnosis not present

## 2020-10-22 DIAGNOSIS — M5116 Intervertebral disc disorders with radiculopathy, lumbar region: Secondary | ICD-10-CM | POA: Diagnosis not present

## 2020-10-25 DIAGNOSIS — H40003 Preglaucoma, unspecified, bilateral: Secondary | ICD-10-CM | POA: Diagnosis not present

## 2020-10-30 DIAGNOSIS — H40003 Preglaucoma, unspecified, bilateral: Secondary | ICD-10-CM | POA: Diagnosis not present

## 2020-10-31 DIAGNOSIS — I25730 Atherosclerosis of nonautologous biological coronary artery bypass graft(s) with unstable angina pectoris: Secondary | ICD-10-CM | POA: Diagnosis not present

## 2020-10-31 DIAGNOSIS — K4090 Unilateral inguinal hernia, without obstruction or gangrene, not specified as recurrent: Secondary | ICD-10-CM | POA: Diagnosis not present

## 2020-11-01 ENCOUNTER — Telehealth: Payer: Self-pay | Admitting: Cardiovascular Disease

## 2020-11-01 ENCOUNTER — Ambulatory Visit: Payer: Self-pay | Admitting: General Surgery

## 2020-11-01 DIAGNOSIS — K4090 Unilateral inguinal hernia, without obstruction or gangrene, not specified as recurrent: Secondary | ICD-10-CM | POA: Diagnosis not present

## 2020-11-01 NOTE — Telephone Encounter (Signed)
   Name: Richard Rivas  DOB: [birth year]  MRN: [id number]  Primary Cardiologist: Kathlyn Sacramento, MD  Chart reviewed as part of pre-operative protocol coverage. Because of Richard Rivas's past medical history and time since last visit, he will require a follow-up visit in order to better assess preoperative cardiovascular risk. He has not been seen in >1 year.   Hx of CAD s/p CABGx3 2018. Low risk myoview 12/2018. Will route to Dr. Fletcher Anon for recommendations regarding Aspirin.   Pre-op covering staff: - Please schedule appointment and call patient to inform them. If patient already had an upcoming appointment within acceptable timeframe, please add "pre-op clearance" to the appointment notes so provider is aware. - Please contact requesting surgeon's office via preferred method (i.e, phone, fax) to inform them of need for appointment prior to surgery.  If applicable, this message will also be routed to pharmacy pool and/or primary cardiologist for input on holding anticoagulant/antiplatelet agent as requested below so that this information is available to the clearing provider at time of patient's appointment.   Loel Dubonnet, NP  11/01/2020, 2:24 PM

## 2020-11-01 NOTE — H&P (Signed)
PATIENT PROFILE: Richard Rivas is a 79 y.o. male who presents to the Clinic for consultation at the request of Dr. Miller for evaluation of right inguinal hernia.  PCP:  Miller, Mark Frederic, MD  HISTORY OF PRESENT ILLNESS: Richard Rivas reports feeling a bulge in the right groin.  He has been happening in the last few months.  During the last week he has been getting more painful.  Patient reported that the inguinal hernia comes in and out.  Pain is intermittently.  Pain is aggravated by certain abdominal wall movement.  Alleviating factor is resting.  Patient denies any abdominal distention nausea or vomiting.  Patient denies any previous surgery in the groin.  Patient has previous right hemicolectomy due to unresectable polyps.   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 Cope          GENERAL REVIEW OF SYSTEMS:   General ROS: negative for - chills, fatigue, fever, weight gain or weight loss Allergy and Immunology ROS: negative for - hives  Hematological and Lymphatic ROS: negative for - bleeding problems or bruising, negative for  palpable nodes Endocrine ROS: negative for - heat or cold intolerance, hair changes Respiratory ROS: negative for - cough, shortness of breath or wheezing Cardiovascular ROS: no chest pain or palpitations GI ROS: negative for nausea, vomiting, abdominal pain, diarrhea, constipation Musculoskeletal ROS: negative for - joint swelling or muscle pain Neurological ROS: negative for - confusion, syncope Dermatological ROS: negative for pruritus and rash Psychiatric: negative for anxiety, depression, difficulty sleeping and memory loss  MEDICATIONS: Current Outpatient Medications  Medication Sig Dispense Refill  . aspirin 325 MG EC tablet Take 325 mg by mouth once daily    . atorvastatin (LIPITOR) 80 MG tablet Take 1 tablet (80 mg total) by mouth once daily 90 tablet 3  . etodolac (LODINE) 400 MG tablet Take 1 tablet (400 mg total) by mouth 2 (two) times daily (Patient taking differently: Take 400 mg by mouth once daily) 60 tablet 11  . isosorbide mononitrate (IMDUR) 30 MG ER tablet Take 1 tablet (30 mg total) by mouth once daily 90 tablet 3  . sertraline (ZOLOFT) 50 MG tablet Take 1 tablet (50 mg total) by mouth once daily 90 tablet 3   No current facility-administered medications for this visit.    ALLERGIES: Venom-honey bee  PAST MEDICAL HISTORY: Past Medical History:  Diagnosis Date  .   Atypical chest pain 12/08/2018  . Benign neoplasm of colon   . BPPV (benign paroxysmal positional vertigo)   . Colon polyp    abnormal with cancer cell  . Hx of adenomatous colonic polyps 12/08/2018  . Kidney stones    Dr. Cope    PAST SURGICAL HISTORY: Past Surgical History:  Procedure Laterality Date  . COLONOSCOPY  09/22/2011, 05/31/2008, 04/07/2005   Adenomatous Polyps: CBF 09/2014; Recall Ltr mailed 07/24/2014 (dw)  . COLONOSCOPY  09/25/2014   Adenomatous Polyps w/High Grade Dysplasia; Surgical Referral to Dr. Wilton Smith MD w/R Colectomy 11/14/2014 : CBF 09/2015; Recall Ltr mailed 08/07/2015 (dw);  OV made 09/10/2015 @ 3:30pm w/Kim Mills NP (dw)  . COLONOSCOPY  10/11/2015   Adenomatous Polyp: CBF 10/2018: Recall ltr mailed   . COLONOSCOPY  01/31/2019   anastomosis/internal hemorrhoids/No Repeat due to age/TKT  . LAPAROSCOPIC COLECTOMY PARTIAL W/ANASTAMOSIS  11/2014  . Neck surgery     Dr. Hanson 20 years ago  . neck surgery    . shoulder surgery       FAMILY HISTORY: Family History  Problem Relation Age of Onset  . Pneumonia Mother   . Myocardial Infarction (Heart attack) Father   . High blood pressure (Hypertension) Father   . Myocardial Infarction (Heart attack) Brother 38  . Ovarian cancer Sister   . Prostate cancer Paternal Grandfather   . Myocardial Infarction (Heart attack) Son   . Ovarian cancer Sister   . Ovarian cancer Sister      SOCIAL HISTORY: Social History   Socioeconomic History  . Marital status: Married  Tobacco Use  . Smoking status: Former Smoker    Quit date: 02/01/1996    Years since quitting: 24.7  . Smokeless tobacco: Former User    Quit date: 02/01/2012  Vaping Use  . Vaping Use: Never used  Substance and Sexual Activity  . Alcohol use: Yes    Alcohol/week: 1.0 standard drink    Types: 1 Cans of beer per week  . Drug use: No    PHYSICAL EXAM: Vitals:   11/01/20 0952  BP: 138/65  Pulse: 69   Body mass index is 28.25 kg/m. Weight: 79.4 kg (175 lb)   GENERAL: Alert, active, oriented x3  HEENT: Pupils equal reactive to light. Extraocular movements are intact. Sclera clear. Palpebral conjunctiva normal red color.Pharynx clear.  NECK: Supple with no palpable mass and no adenopathy.  LUNGS: Sound clear with no rales rhonchi or wheezes.  HEART: Regular rhythm S1 and S2 without murmur.  ABDOMEN: Soft and depressible, nontender with no palpable mass, no hepatomegaly.  Right groin bulging on coughing.  Reducible.  EXTREMITIES: Well-developed well-nourished symmetrical with no dependent edema.  NEUROLOGICAL: Awake alert oriented,  facial expression symmetrical, moving all extremities.  REVIEW OF DATA: I have reviewed the following data today: Office Visit on 10/15/2020  Component Date Value  . Color 10/15/2020 Yellow   . Clarity 10/15/2020 Clear   . Specific Gravity 10/15/2020 >=1.030   . pH, Urine 10/15/2020 6.0   . Protein, Urinalysis 10/15/2020 Trace   . Glucose, Urinalysis 10/15/2020 Negative   . Ketones, Urinalysis 10/15/2020 Trace (!)  . Blood, Urinalysis 10/15/2020 Trace (!)  . Nitrite, Urinalysis 10/15/2020 Negative   . Leukocyte Esterase, Urin* 10/15/2020 Negative   Appointment on 10/01/2020  Component Date Value  . WBC (White Blood Cell Co* 10/01/2020 8.8   . RBC (Red Blood Cell Coun* 10/01/2020 5.08   . Hemoglobin 10/01/2020 14.6   . Hematocrit 10/01/2020 45.9   .   MCV (Mean Corpuscular Vo* 10/01/2020 90.4   . MCH (Mean Corpuscular He* 10/01/2020 28.7   . MCHC (Mean Corpuscular H* 10/01/2020 31.8 (!)  . Platelet Count 10/01/2020 173   . RDW-CV (Red Cell Distrib* 10/01/2020 14.2   . MPV (Mean Platelet Volum* 10/01/2020 11.4   . Neutrophils 10/01/2020 5.39   . Lymphocytes 10/01/2020 2.12   . Monocytes 10/01/2020 0.93   . Eosinophils 10/01/2020 0.25   . Basophils 10/01/2020 0.06   . Neutrophil % 10/01/2020 61.5   . Lymphocyte % 10/01/2020 24.2   . Monocyte % 10/01/2020 10.6   . Eosinophil % 10/01/2020 2.9   . Basophil% 10/01/2020 0.7   . Immature Granulocyte % 10/01/2020 0.1   . Immature Granulocyte Cou* 10/01/2020 0.01   . Glucose 10/01/2020 94   . Sodium 10/01/2020 143   . Potassium 10/01/2020 4.3   . Chloride 10/01/2020 109   . Carbon Dioxide (CO2) 10/01/2020 26.4   . Urea Nitrogen (BUN) 10/01/2020 20   . Creatinine 10/01/2020 0.9   . Glomerular Filtration Ra* 10/01/2020 82   . Calcium 10/01/2020 9.2   . AST  10/01/2020 19   . ALT  10/01/2020 18   . Alk Phos (alkaline Phosp* 10/01/2020 45   . Albumin 10/01/2020 4.2   . Bilirubin, Total 10/01/2020 0.9   . Protein, Total 10/01/2020  6.5   . A/G Ratio 10/01/2020 1.8   . Cholesterol, Total 10/01/2020 124   . Triglyceride 10/01/2020 55   . HDL (High Density Lipopr* 10/01/2020 54.0   . LDL Calculated 10/01/2020 59   . VLDL Cholesterol 10/01/2020 11   . Cholesterol/HDL Ratio 10/01/2020 2.3   . Magnesium 10/01/2020 1.7 (!)  . PSA (Prostate Specific A* 10/01/2020 1.82   . Thyroid Stimulating Horm* 10/01/2020 1.969   . Color 10/01/2020 Yellow   . Clarity 10/01/2020 Clear   . Specific Gravity 10/01/2020 1.020   . pH, Urine 10/01/2020 5.0   . Protein, Urinalysis 10/01/2020 Negative   . Glucose, Urinalysis 10/01/2020 Negative   . Ketones, Urinalysis 10/01/2020 Negative   . Blood, Urinalysis 10/01/2020 Negative   . Nitrite, Urinalysis 10/01/2020 Negative   . Leukocyte Esterase, Urin* 10/01/2020 Negative   . White Blood Cells, Urina* 10/01/2020 None Seen   . Red Blood Cells, Urinaly* 10/01/2020 None Seen   . Bacteria, Urinalysis 10/01/2020 None Seen   . Squamous Epithelial Cell* 10/01/2020 None Seen   . Vitamin B12 10/01/2020 147 (!)     ASSESSMENT: Richard Rivas is a 80 y.o. male presenting for consultation for right inguinal hernia.    The patient presents with a symptomatic, reducible inguinal hernia. Patient was oriented about the diagnosis of inguinal hernia and its implication. The patient was oriented about the treatment alternatives (observation vs surgical repair). Due to patient symptoms, repair is recommended. Patient oriented about the surgical procedure, the use of mesh and its risk of complications such as: infection, bleeding, injury to vas deference, vasculature and testicle, injury to bowel or bladder, and chronic pain.   Patient with history of coronary artery disease s/p CABG.  Patient reports feeling great.  He denies any chest pain.  He is very active.  He is able to do 4 METS without getting short of breath or chest pain.  Patient currently on aspirin 325 mg daily.  We will contact cardiologist for medical  clearance.  Patient will need to stop aspirin 5 days before the surgery.  Non-recurrent unilateral inguinal hernia without obstruction or gangrene [K40.90]  PLAN:  1.  Robotic assisted laparoscopic right inguinal hernia repair with mesh ([id number]) 2.  CBC, CMP (done 10/01/20) 3.  Hold taking aspirin 5 days before procedure 4.  Cardiology Clearance 5.  Contact us if has any question or concern.   Patient verbalized understanding, all questions were answered, and were agreeable with the plan outlined above.    Janet Humphreys Cintron-Diaz, MD  Electronically signed by Azarian Starace Cintron-Diaz, MD  

## 2020-11-01 NOTE — Telephone Encounter (Signed)
   [facility] Pre-operative Risk Assessment    Patient Name: Richard Rivas  DOB: 24-Jul-1940  MRN: [id number]   HEARTCARE STAFF: - Please ensure there is not already an duplicate clearance open for this procedure. - Under Visit Info/Reason for Call, type in Other and utilize the format Clearance MM/DD/YY or Clearance TBD. Do not use dashes or single digits. - If request is for dental extraction, please clarify the # of teeth to be extracted. - If the patient is currently at the dentist's office, call Pre-Op APP to address. If the patient is not currently in the dentist office, please route to the Pre-Op pool  Request for surgical clearance:  1. What type of surgery is being performed? Robotic RT Inguinal hernia repair   2. When is this surgery scheduled? 11/12/20  3. What type of clearance is required (medical clearance vs. Pharmacy clearance to hold med vs. Both)? both  4. Are there any medications that need to be held prior to surgery and how long? ASA instuctions  5. Practice name and name of physician performing surgery? [facility] - Dr Peyton Najjar  6. What is the office phone number? [phone]   7.   What is the office fax number? [phone]  8.   Anesthesia type (None, local, MAC, general) ? Not listed    Richard Rivas 11/01/2020, 1:09 PM  _________________________________________________________________   (provider comments below)

## 2020-11-01 NOTE — Telephone Encounter (Signed)
Clearance will be addressed at upcoming appt tomorrow. Requesting party aware. Will remove from preop pool. Loel Dubonnet, NP

## 2020-11-01 NOTE — H&P (View-Only) (Signed)
PATIENT PROFILE: Nobuo T Vanpatten is a 80 y.o. male who presents to the Clinic for consultation at the request of Dr. Miller for evaluation of right inguinal hernia.  PCP:  Miller, Mark Frederic, MD  HISTORY OF PRESENT ILLNESS: Mr. Benassi reports feeling a bulge in the right groin.  He has been happening in the last few months.  During the last week he has been getting more painful.  Patient reported that the inguinal hernia comes in and out.  Pain is intermittently.  Pain is aggravated by certain abdominal wall movement.  Alleviating factor is resting.  Patient denies any abdominal distention nausea or vomiting.  Patient denies any previous surgery in the groin.  Patient has previous right hemicolectomy due to unresectable polyps.   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 Cope          GENERAL REVIEW OF SYSTEMS:   General ROS: negative for - chills, fatigue, fever, weight gain or weight loss Allergy and Immunology ROS: negative for - hives  Hematological and Lymphatic ROS: negative for - bleeding problems or bruising, negative for  palpable nodes Endocrine ROS: negative for - heat or cold intolerance, hair changes Respiratory ROS: negative for - cough, shortness of breath or wheezing Cardiovascular ROS: no chest pain or palpitations GI ROS: negative for nausea, vomiting, abdominal pain, diarrhea, constipation Musculoskeletal ROS: negative for - joint swelling or muscle pain Neurological ROS: negative for - confusion, syncope Dermatological ROS: negative for pruritus and rash Psychiatric: negative for anxiety, depression, difficulty sleeping and memory loss  MEDICATIONS: Current Outpatient Medications  Medication Sig Dispense Refill  . aspirin 325 MG EC tablet Take 325 mg by mouth once daily    . atorvastatin (LIPITOR) 80 MG tablet Take 1 tablet (80 mg total) by mouth once daily 90 tablet 3  . etodolac (LODINE) 400 MG tablet Take 1 tablet (400 mg total) by mouth 2 (two) times daily (Patient taking differently: Take 400 mg by mouth once daily) 60 tablet 11  . isosorbide mononitrate (IMDUR) 30 MG ER tablet Take 1 tablet (30 mg total) by mouth once daily 90 tablet 3  . sertraline (ZOLOFT) 50 MG tablet Take 1 tablet (50 mg total) by mouth once daily 90 tablet 3   No current facility-administered medications for this visit.    ALLERGIES: Venom-honey bee  PAST MEDICAL HISTORY: Past Medical History:  Diagnosis Date  .   Atypical chest pain 12/08/2018  . Benign neoplasm of colon   . BPPV (benign paroxysmal positional vertigo)   . Colon polyp    abnormal with cancer cell  . Hx of adenomatous colonic polyps 12/08/2018  . Kidney stones    Dr. Cope    PAST SURGICAL HISTORY: Past Surgical History:  Procedure Laterality Date  . COLONOSCOPY  09/22/2011, 05/31/2008, 04/07/2005   Adenomatous Polyps: CBF 09/2014; Recall Ltr mailed 07/24/2014 (dw)  . COLONOSCOPY  09/25/2014   Adenomatous Polyps w/High Grade Dysplasia; Surgical Referral to Dr. Wilton Smith MD w/R Colectomy 11/14/2014 : CBF 09/2015; Recall Ltr mailed 08/07/2015 (dw);  OV made 09/10/2015 @ 3:30pm w/Kim Mills NP (dw)  . COLONOSCOPY  10/11/2015   Adenomatous Polyp: CBF 10/2018: Recall ltr mailed   . COLONOSCOPY  01/31/2019   anastomosis/internal hemorrhoids/No Repeat due to age/TKT  . LAPAROSCOPIC COLECTOMY PARTIAL W/ANASTAMOSIS  11/2014  . Neck surgery     Dr. Hanson 20 years ago  . neck surgery    . shoulder surgery       FAMILY HISTORY: Family History  Problem Relation Age of Onset  . Pneumonia Mother   . Myocardial Infarction (Heart attack) Father   . High blood pressure (Hypertension) Father   . Myocardial Infarction (Heart attack) Brother 38  . Ovarian cancer Sister   . Prostate cancer Paternal Grandfather   . Myocardial Infarction (Heart attack) Son   . Ovarian cancer Sister   . Ovarian cancer Sister      SOCIAL HISTORY: Social History   Socioeconomic History  . Marital status: Married  Tobacco Use  . Smoking status: Former Smoker    Quit date: 02/01/1996    Years since quitting: 24.7  . Smokeless tobacco: Former User    Quit date: 02/01/2012  Vaping Use  . Vaping Use: Never used  Substance and Sexual Activity  . Alcohol use: Yes    Alcohol/week: 1.0 standard drink    Types: 1 Cans of beer per week  . Drug use: No    PHYSICAL EXAM: Vitals:   11/01/20 0952  BP: 138/65  Pulse: 69   Body mass index is 28.25 kg/m. Weight: 79.4 kg (175 lb)   GENERAL: Alert, active, oriented x3  HEENT: Pupils equal reactive to light. Extraocular movements are intact. Sclera clear. Palpebral conjunctiva normal red color.Pharynx clear.  NECK: Supple with no palpable mass and no adenopathy.  LUNGS: Sound clear with no rales rhonchi or wheezes.  HEART: Regular rhythm S1 and S2 without murmur.  ABDOMEN: Soft and depressible, nontender with no palpable mass, no hepatomegaly.  Right groin bulging on coughing.  Reducible.  EXTREMITIES: Well-developed well-nourished symmetrical with no dependent edema.  NEUROLOGICAL: Awake alert oriented,  facial expression symmetrical, moving all extremities.  REVIEW OF DATA: I have reviewed the following data today: Office Visit on 10/15/2020  Component Date Value  . Color 10/15/2020 Yellow   . Clarity 10/15/2020 Clear   . Specific Gravity 10/15/2020 >=1.030   . pH, Urine 10/15/2020 6.0   . Protein, Urinalysis 10/15/2020 Trace   . Glucose, Urinalysis 10/15/2020 Negative   . Ketones, Urinalysis 10/15/2020 Trace (!)  . Blood, Urinalysis 10/15/2020 Trace (!)  . Nitrite, Urinalysis 10/15/2020 Negative   . Leukocyte Esterase, Urin* 10/15/2020 Negative   Appointment on 10/01/2020  Component Date Value  . WBC (White Blood Cell Co* 10/01/2020 8.8   . RBC (Red Blood Cell Coun* 10/01/2020 5.08   . Hemoglobin 10/01/2020 14.6   . Hematocrit 10/01/2020 45.9   .   MCV (Mean Corpuscular Vo* 10/01/2020 90.4   . MCH (Mean Corpuscular He* 10/01/2020 28.7   . MCHC (Mean Corpuscular H* 10/01/2020 31.8 (!)  . Platelet Count 10/01/2020 173   . RDW-CV (Red Cell Distrib* 10/01/2020 14.2   . MPV (Mean Platelet Volum* 10/01/2020 11.4   . Neutrophils 10/01/2020 5.39   . Lymphocytes 10/01/2020 2.12   . Monocytes 10/01/2020 0.93   . Eosinophils 10/01/2020 0.25   . Basophils 10/01/2020 0.06   . Neutrophil % 10/01/2020 61.5   . Lymphocyte % 10/01/2020 24.2   . Monocyte % 10/01/2020 10.6   . Eosinophil % 10/01/2020 2.9   . Basophil% 10/01/2020 0.7   . Immature Granulocyte % 10/01/2020 0.1   . Immature Granulocyte Cou* 10/01/2020 0.01   . Glucose 10/01/2020 94   . Sodium 10/01/2020 143   . Potassium 10/01/2020 4.3   . Chloride 10/01/2020 109   . Carbon Dioxide (CO2) 10/01/2020 26.4   . Urea Nitrogen (BUN) 10/01/2020 20   . Creatinine 10/01/2020 0.9   . Glomerular Filtration Ra* 10/01/2020 82   . Calcium 10/01/2020 9.2   . AST  10/01/2020 19   . ALT  10/01/2020 18   . Alk Phos (alkaline Phosp* 10/01/2020 45   . Albumin 10/01/2020 4.2   . Bilirubin, Total 10/01/2020 0.9   . Protein, Total 10/01/2020  6.5   . A/G Ratio 10/01/2020 1.8   . Cholesterol, Total 10/01/2020 124   . Triglyceride 10/01/2020 55   . HDL (High Density Lipopr* 10/01/2020 54.0   . LDL Calculated 10/01/2020 59   . VLDL Cholesterol 10/01/2020 11   . Cholesterol/HDL Ratio 10/01/2020 2.3   . Magnesium 10/01/2020 1.7 (!)  . PSA (Prostate Specific A* 10/01/2020 1.82   . Thyroid Stimulating Horm* 10/01/2020 1.969   . Color 10/01/2020 Yellow   . Clarity 10/01/2020 Clear   . Specific Gravity 10/01/2020 1.020   . pH, Urine 10/01/2020 5.0   . Protein, Urinalysis 10/01/2020 Negative   . Glucose, Urinalysis 10/01/2020 Negative   . Ketones, Urinalysis 10/01/2020 Negative   . Blood, Urinalysis 10/01/2020 Negative   . Nitrite, Urinalysis 10/01/2020 Negative   . Leukocyte Esterase, Urin* 10/01/2020 Negative   . White Blood Cells, Urina* 10/01/2020 None Seen   . Red Blood Cells, Urinaly* 10/01/2020 None Seen   . Bacteria, Urinalysis 10/01/2020 None Seen   . Squamous Epithelial Cell* 10/01/2020 None Seen   . Vitamin B12 10/01/2020 147 (!)     ASSESSMENT: Mr. Uhrich is a 80 y.o. male presenting for consultation for right inguinal hernia.    The patient presents with a symptomatic, reducible inguinal hernia. Patient was oriented about the diagnosis of inguinal hernia and its implication. The patient was oriented about the treatment alternatives (observation vs surgical repair). Due to patient symptoms, repair is recommended. Patient oriented about the surgical procedure, the use of mesh and its risk of complications such as: infection, bleeding, injury to vas deference, vasculature and testicle, injury to bowel or bladder, and chronic pain.   Patient with history of coronary artery disease s/p CABG.  Patient reports feeling great.  He denies any chest pain.  He is very active.  He is able to do 4 METS without getting short of breath or chest pain.  Patient currently on aspirin 325 mg daily.  We will contact cardiologist for medical  clearance.  Patient will need to stop aspirin 5 days before the surgery.  Non-recurrent unilateral inguinal hernia without obstruction or gangrene [K40.90]  PLAN:  1.  Robotic assisted laparoscopic right inguinal hernia repair with mesh ([id number]) 2.  CBC, CMP (done 10/01/20) 3.  Hold taking aspirin 5 days before procedure 4.  Cardiology Clearance 5.  Contact us if has any question or concern.   Patient verbalized understanding, all questions were answered, and were agreeable with the plan outlined above.    Herbert Pun, MD  Electronically signed by Herbert Pun, MD

## 2020-11-01 NOTE — Telephone Encounter (Signed)
Pt made aware he will need an appt for pre op clearance. Pt agreeable to appt tomorrow with Marrianne Mood, [place] 11/02/20 @ 9:30. Pt is grateful for the appt. I will send clearance notes to [facility] for upcoming appt. Will send FYI to requesting office pt ha appt tomorrow.

## 2020-11-02 ENCOUNTER — Ambulatory Visit: Payer: PPO | Admitting: Medical

## 2020-11-02 ENCOUNTER — Other Ambulatory Visit: Payer: Self-pay

## 2020-11-02 ENCOUNTER — Encounter: Payer: Self-pay | Admitting: Medical

## 2020-11-02 VITALS — BP 130/70 | HR 72 | Ht 68.0 in | Wt 176.0 lb

## 2020-11-02 DIAGNOSIS — I10 Essential (primary) hypertension: Secondary | ICD-10-CM | POA: Diagnosis not present

## 2020-11-02 DIAGNOSIS — I493 Ventricular premature depolarization: Secondary | ICD-10-CM | POA: Diagnosis not present

## 2020-11-02 DIAGNOSIS — E785 Hyperlipidemia, unspecified: Secondary | ICD-10-CM

## 2020-11-02 DIAGNOSIS — I491 Atrial premature depolarization: Secondary | ICD-10-CM

## 2020-11-02 DIAGNOSIS — Z01810 Encounter for preprocedural cardiovascular examination: Secondary | ICD-10-CM

## 2020-11-02 DIAGNOSIS — I2510 Atherosclerotic heart disease of native coronary artery without angina pectoris: Secondary | ICD-10-CM

## 2020-11-02 DIAGNOSIS — I77819 Aortic ectasia, unspecified site: Secondary | ICD-10-CM

## 2020-11-02 MED ORDER — ASPIRIN EC 81 MG PO TBEC: 81.0000 mg | DELAYED_RELEASE_TABLET | Freq: Every day | ORAL | Status: DC

## 2020-11-02 NOTE — Progress Notes (Signed)
Cardiology Office Note:    Date:  11/02/2020   ID:  Richard Rivas, DOB December 08, 1940, MRN [id number]  PCP:  Rusty Aus, MD  [facility] Cardiologist:  Kathlyn Sacramento, MD  [facility] Electrophysiologist:  None   Referring MD: Rusty Aus, MD   Chief Complaint: Pre-op cardiac evaluation  History of Present Illness:    Richard Rivas is a 80 y.o. male with a hx of CAD with small non-STEMI 01/2017 status post CABG x3, ectatic aorta, hypertension, prior tobacco use, nephrolithiasis, BPPV, depression, GERD resents for preop cardiac evaluation. for robotic assisted inguinal hernia repair scheduled for 11/12/2020.  Patient was admitted September 2018 with small non-STEMI.  Cardiac cath showed severe distal LM stenosis at ostium of LAD and left circumflex with mild RCA disease.  EF was normal.  He ultimately underwent CABG x3 (IMA to LAD, SVG to left circumflex, SVG to PDA).  3 bypass carotid duplex 01/2017 showed bilateral 1 to 30% ICA stenosis with antegrade vertebral flow.  Preop ABIs were normal with right-sided ABI 1.06 and left-sided ABI 1.02.  Aortic ultrasound 11/20/2018 showed aortic iliac atherosclerosis with ectasia and maximally aortic diameter 2.8 cm in maximal left iliac diameter 1.9 cm followed by PCP.  Patient was seen in clinic 12/20/2018 for chest pain.  CT scan Myoview was ordered and he was started on Imdur 30 mg daily.  Lexiscan Myoview was low risk with no significant ischemia with EF of 70%.  On beta-blocker due to baseline bradycardia with a heart rate of 60.  Patient was last seen 10/26/2019 doing well from a cardiac perspective.  Aspirin was reduced to 81 mg daily.  He was recently referred to [facility] for lower extremity weakness. ABIs were normal with no significant disease.   Today, the patient reports he has been doing well from a cardiac perspective. He has an upcoming eye surgery next week. He started noticing the hernia 1-2 weeks ago, can be painful. More painful with a  lot of walking. He walks his dog 2-3 times a day for 10-15 minutes. He denies shortness of breath of chest pain. EKG shows SR with PAC and PVC.  He denies lightheadedness, dizziness, lower leg edema, orthopnea, pnd, palpitations. BP today 130/70, heart rate 72bpm, which is pretty standard for patient. HE has been taking Aspirin 325mg  daily. No bleeding issues on Aspirin.   Past Medical History:  Diagnosis Date  . BPPV (benign paroxysmal positional vertigo)   . Colon polyps   . Coronary artery disease    a.  Status post three-vessel CABG in 01/2017 with LIMA to LAD, SVG to LCx, SVG to PDA  . Depression   . GERD (gastroesophageal reflux disease)   . History of kidney stones   . Hypertension   . Kidney stones   . Myocardial infarction [facility])     Past Surgical History:  Procedure Laterality Date  . CARDIAC CATHETERIZATION    . CERVICAL DISC SURGERY    . COLON SURGERY    . COLONOSCOPY WITH PROPOFOL N/A 10/11/2015   Procedure: COLONOSCOPY WITH PROPOFOL;  Surgeon: Manya Silvas, MD;  Location: [facility] ENDOSCOPY;  Service: Endoscopy;  Laterality: N/A;  . COLONOSCOPY WITH PROPOFOL N/A 01/31/2019   Procedure: COLONOSCOPY WITH PROPOFOL;  Surgeon: Toledo, Benay Pike, MD;  Location: [facility] ENDOSCOPY;  Service: Gastroenterology;  Laterality: N/A;  . CORONARY ARTERY BYPASS GRAFT N/A 02/13/2017   Procedure: CORONARY ARTERY BYPASS GRAFTING (CABG) x3 (LIMA to LAD, SVG to CIRCUMFLEX , SVG to PDA )  ,  using left internal mammary artery and right leg greater saphenous vein harvested endoscopically;  Surgeon: [name], MD;  Location: [place] OR;  Service: Open Heart Surgery;  Laterality: N/A;  . KIDNEY STONE SURGERY    . LAPAROSCOPIC RIGHT COLECTOMY Right 11/14/2014   Procedure: LAPAROSCOPIC RIGHT COLECTOMY;  Surgeon: Leonie Green, MD;  Location: [facility] ORS;  Service: General;  Laterality: Right;  . LEFT HEART CATH AND CORONARY ANGIOGRAPHY N/A 02/12/2017   Procedure: LEFT HEART CATH AND CORONARY ANGIOGRAPHY  and possible pci;  Surgeon: Yolonda Kida, MD;  Location: [place] CV LAB;  Service: Cardiovascular;  Laterality: N/A;  . SHOULDER ARTHROSCOPY W/ ROTATOR CUFF REPAIR Right   . TEE WITHOUT CARDIOVERSION N/A 02/13/2017   Procedure: TRANSESOPHAGEAL ECHOCARDIOGRAM (TEE);  Surgeon: [name], MD;  Location: [place];  Service: Open Heart Surgery;  Laterality: N/A;    Current Medications: Current Meds  Medication Sig  . aspirin EC 81 MG tablet Take 1 tablet (81 mg total) by mouth daily. Swallow whole.  Marland Kitchen atorvastatin (LIPITOR) 80 MG tablet Take 80 mg by mouth daily.  Marland Kitchen etodolac (LODINE) 400 MG tablet Take 400 mg by mouth daily.  . isosorbide mononitrate (IMDUR) 30 MG 24 hr tablet Take 1 tablet (30 mg total) by mouth daily.  . sertraline (ZOLOFT) 50 MG tablet Take 50 mg by mouth daily.  . [DISCONTINUED] aspirin EC 325 MG tablet Take 325 mg by mouth daily.     Allergies:   Bee venom   Social History   Socioeconomic History  . Marital status: Married    Spouse name: Not on file  . Number of children: Not on file  . Years of education: Not on file  . Highest education level: Not on file  Occupational History  . Not on file  Tobacco Use  . Smoking status: Former Smoker    Years: 40.00    Types: Cigarettes    Quit date: 11/06/1994    Years since quitting: 26.0  . Smokeless tobacco: Former Systems developer    Quit date: 11/05/2012  Vaping Use  . Vaping Use: Never used  Substance and Sexual Activity  . Alcohol use: Yes    Comment: beer occasionally  . Drug use: No  . Sexual activity: Not on file  Other Topics Concern  . Not on file  Social History Narrative  . Not on file   Social Determinants of Health   Financial Resource Strain: Not on file  Food Insecurity: Not on file  Transportation Needs: Not on file  Physical Activity: Not on file  Stress: Not on file  Social Connections: Not on file     Family History: The patient's family history includes Heart attack in his  brother and father.  ROS:   Please see the history of present illness.     All other systems reviewed and are negative.  EKGs/Labs/Other Studies Reviewed:    The following studies were reviewed today:  Echo TEE 2018 Study Conclusions   - Left ventricle: Systolic function was normal. The estimated  ejection fraction was in the range of 55% to 65%. Wall motion was  normal; there were no regional wall motion abnormalities.  - Staged echo: Limited Post-CPB exam: Good vigorous LV function.  Contractility is hyperdynamic, with no wall motion abnormalities.  Overall EF 65-70%.   Cardiac cath 01/2017   Mid RCA lesion, 50 %stenosed.  Prox RCA lesion, 50 %stenosed.  LM lesion, 99 %stenosed.  Ost Cx lesion, 99 %stenosed.  Dist LAD lesion, 50 %stenosed.  The left ventricular systolic function is normal.  LV end diastolic pressure is normal.  The left ventricular ejection fraction is 55-65% by visual estimate.   Conclusion Diagnostic cardiac catheter with normal left ventricular function Severe distal left main disease 99% Severe ostial circumflex disease 99% Moderate LAD disease Moderate RCA disease Recommend transferred [facility] coronary bypass [facility] at [facility]   EKG:  EKG is  ordered today.  The ekg ordered today demonstrates NSR, 72bpm, PAC/PVC, no ST/T wave changes  Recent Labs: 08/03/2020: Creatinine, Ser 1.10  Recent Lipid Panel    Component Value Date/Time   CHOL 150 02/12/2017 0527   TRIG 102 02/12/2017 0527   HDL 43 02/12/2017 0527   CHOLHDL 3.5 02/12/2017 0527   VLDL 20 02/12/2017 0527   LDLCALC 87 02/12/2017 0527    Physical Exam:    VS:  BP 130/70 (BP Location: Left Arm, Patient Position: Sitting, Cuff Size: Normal)   Pulse 72   Ht 5\' 8"  (1.727 m)   Wt 176 lb (79.8 kg)   SpO2 96%   BMI 26.76 kg/m     Wt Readings from Last 3 Encounters:  11/02/20 176 lb (79.8 kg)  09/04/20 176 lb 9.6 oz (80.1 kg)  10/26/19 179 lb (81.2 kg)     GEN:   Well nourished, well developed in no acute distress HEENT: Normal NECK: No JVD; No carotid bruits LYMPHATICS: No lymphadenopathy CARDIAC: RRR, no murmurs, rubs, gallops RESPIRATORY:  Clear to auscultation without rales, wheezing or rhonchi  ABDOMEN: Soft, non-tender, non-distended MUSCULOSKELETAL:  No edema; No deformity  SKIN: Warm and dry NEUROLOGIC:  Alert and oriented x 3 PSYCHIATRIC:  Normal affect   ASSESSMENT:    1. Coronary artery disease involving native coronary artery of native heart without angina pectoris   2. Hyperlipidemia LDL goal <70   3. Aortic ectasia (HCC)   4. Essential hypertension   5. Pre-operative cardiovascular examination   6. PAC (premature atrial contraction)   7. PVC (premature ventricular contraction)    PLAN:    In order of problems listed above:  CAD status post CABG x3 in 2018 Patient denies anginal symptoms. I will decrease Aspirin to 81 mg daily. He has not had bleeding issues so far. Lexiscan Myoview 12/2018 was normal with no significant ischemia, EF estimated at 70%, overall low risk. Continue statin. No BB with h/o baseline bradycardia.   Hyperlipidemia Continue Atorvastatin 80 mg daily. Labs showed LDL 59 in 09/2020.  Ectatic aorta Followed by PCP/ Duplex 11/2018 recommended repeat US in 5 years.   Hypertension BP has been well controlled. Continue Imdur 30mg  daily.   PAC/PVC EKG with SR and PAV/PVC. Patient is asymptomatic. Can consider BB in the future if heart rate can tolerate.   Preop cardiac evaluation prior to inguinal hernia repair Patient noted inguinal hernia 1-2 weeks ago, has some pain with walking however still able to function normally. Able to walk his dog 2-3 times a day 10-20 minutes. Denies anginal symptoms. No LLE, orthopnea, pnd. EKG shows SR with PAC/PVC however patient without palpitations/symptoms, no ST/T wave changes. Can consider BB in the future as above. Also has upcoming eye surgery. METS>4. According to  Revised cardiac risk index he is Class 2 risk, 6% 30 day risk of death, MI, or cardiac arrest. No further cardiac testing required prior to upcoming surgery.  Regarding ASA therapy, we recommend continuation of ASA throughout the perioperative period.  However, if the surgeon feels that  cessation of ASA is required in the perioperative period, it may be stopped 5-7 days prior to surgery with a plan to resume it as soon as felt to be feasible from a surgical standpoint in the post-operative period.  Disposition: Follow up in 1 year(s) with MD/APP    Signed, Antoria Lanza Ninfa Meeker, PA-C  11/02/2020 9:59 AM    [facility]

## 2020-11-02 NOTE — Patient Instructions (Signed)
Medication Instructions:  - Your physician has recommended you make the following change in your medication:   1) DECREASE aspirin to 81 mg- take 1 tablet by mouth once daily   *If you need a refill on your cardiac medications before your next appointment, please call your pharmacy*   Lab Work: - none ordered  If you have labs (blood work) drawn today and your tests are completely normal, you will receive your results only by: Marland Kitchen MyChart Message (if you have MyChart) OR . A paper copy in the mail If you have any lab test that is abnormal or we need to change your treatment, we will call you to review the results.   Testing/Procedures: - none ordered   Follow-Up: At [facility], you and your health needs are our priority.  As part of our continuing mission to provide you with exceptional heart care, we have created designated Provider Care Teams.  These Care Teams include your primary Cardiologist (physician) and Advanced Practice Providers (APPs -  Physician Assistants and Nurse Practitioners) who all work together to provide you with the care you need, when you need it.  We recommend signing up for the patient portal called "MyChart".  Sign up information is provided on this After Visit Summary.  MyChart is used to connect with patients for Virtual Visits (Telemedicine).  Patients are able to view lab/test results, encounter notes, upcoming appointments, etc.  Non-urgent messages can be sent to your provider as well.   To learn more about what you can do with MyChart, go to [email or web address].    Your next appointment:   1 year(s)  The format for your next appointment:   In Person  Provider:   You may see Kathlyn Sacramento, MD or one of the following Advanced Practice Providers on your designated Care Team:    Murray Hodgkins, NP  Christell Faith, PA-C  Marrianne Mood, PA-C  Cadence Barnhart, [place]  Laurann Montana, NP    Other Instructions n/a

## 2020-11-08 ENCOUNTER — Other Ambulatory Visit
Admission: RE | Admit: 2020-11-08 | Discharge: 2020-11-08 | Disposition: A | Discharge status: Home / Self Care | Payer: PPO | Source: Ambulatory Visit | Attending: General Surgery | Admitting: General Surgery

## 2020-11-08 ENCOUNTER — Encounter: Payer: Self-pay | Admitting: General Surgery

## 2020-11-08 ENCOUNTER — Other Ambulatory Visit: Payer: Self-pay

## 2020-11-08 DIAGNOSIS — H02055 Trichiasis without entropian left lower eyelid: Secondary | ICD-10-CM | POA: Diagnosis not present

## 2020-11-08 NOTE — Patient Instructions (Addendum)
Your procedure is scheduled on: 11/12/20 Report to the Registration Desk on the 1st floor of the [facility]. To find out your arrival time, please call [phone] between 1PM - 3PM on: 11/09/20  REMEMBER: Instructions that are not followed completely may result in serious medical risk, up to and including death; or upon the discretion of your surgeon and anesthesiologist your surgery may need to be rescheduled.  Do not eat food or drink any fluids after midnight the night before surgery.  No gum chewing, lozengers or hard candies.  TAKE THESE MEDICATIONS THE MORNING OF SURGERY WITH A SIP OF WATER:  - isosorbide mononitrate (IMDUR) 30 MG 24 hr tablet - atorvastatin (LIPITOR) 80 MG tablet - sertraline (ZOLOFT) 50 MG tablet  Follow recommendations from Cardiologist, Pulmonologist or PCP regarding stopping Aspirin, Coumadin, Plavix, Eliquis, Pradaxa, or Pletal. Stop Aspirin 81 mg beginning 11/07/20.   One week prior to surgery: Stop taking begging 11/07/20 - etodolac (LODINE) 400 MG tablet Stop Anti-inflammatories (NSAIDS) such as Advil, Aleve, Ibuprofen, Motrin, Naproxen, Naprosyn and Aspirin based products such as Excedrin, Goodys Powder, BC Powder.  Stop ANY OVER THE COUNTER supplements until after surgery.  You may take Tylenol if needed for pain up until the day of surgery.  No Alcohol for 24 hours before or after surgery.  No Smoking including e-cigarettes for 24 hours prior to surgery.  No chewable tobacco products for at least 6 hours prior to surgery.  No nicotine patches on the day of surgery.  Do not use any "recreational" drugs for at least a week prior to your surgery.  Please be advised that the combination of cocaine and anesthesia may have negative outcomes, up to and including death. If you test positive for cocaine, your surgery will be cancelled.  On the morning of surgery brush your teeth with toothpaste and water, you may rinse your mouth with mouthwash if  you wish. Do not swallow any toothpaste or mouthwash.  Do not wear jewelry, make-up, hairpins, clips or nail polish.  Do not wear lotions, powders, or perfumes.   Do not shave body from the neck down 48 hours prior to surgery just in case you cut yourself which could leave a site for infection.  Also, freshly shaved skin may become irritated if using the CHG soap.  Contact lenses, hearing aids and dentures may not be worn into surgery.  Do not bring valuables to the hospital. [facility] is not responsible for any missing/lost belongings or valuables.   Notify your doctor if there is any change in your medical condition (cold, fever, infection).  Wear comfortable clothing (specific to your surgery type) to the hospital.  Plan for stool softeners for home use; pain medications have a tendency to cause constipation. You can also help prevent constipation by eating foods high in fiber such as fruits and vegetables and drinking plenty of fluids as your diet allows.  After surgery, you can help prevent lung complications by doing breathing exercises.  Take deep breaths and cough every 1-2 hours. Your doctor may order a device called an Incentive Spirometer to help you take deep breaths. When coughing or sneezing, hold a pillow firmly against your incision with both hands. This is called "splinting." Doing this helps protect your incision. It also decreases belly discomfort.  If you are being admitted to the hospital overnight, leave your suitcase in the car. After surgery it may be brought to your room.  If you are being discharged the day of  surgery, you will not be allowed to drive home. You will need a responsible adult (18 years or older) to drive you home and stay with you that night.   If you are taking public transportation, you will need to have a responsible adult (18 years or older) with you. Please confirm with your physician that it is acceptable to use public transportation.    Please call the [facility] at [phone] if you have any questions about these instructions.  Surgery Visitation Policy:  Patients undergoing a surgery or procedure may have one family member or support person with them as long as that person is not COVID-19 positive or experiencing its symptoms.  That person may remain in the waiting area during the procedure.  Inpatient Visitation:    Visiting hours are 7 a.m. to 8 p.m. Inpatients will be allowed two visitors daily. The visitors may change each day during the patient's stay. No visitors under the age of 4. Any visitor under the age of 62 must be accompanied by an adult. The visitor must pass COVID-19 screenings, use hand sanitizer when entering and exiting the patient's room and wear a mask at all times, including in the patient's room. Patients must also wear a mask when staff or their visitor are in the room. Masking is required regardless of vaccination status.

## 2020-11-09 ENCOUNTER — Encounter: Payer: Self-pay | Admitting: General Surgery

## 2020-11-09 NOTE — Progress Notes (Signed)
Perioperative Services  Pre-Admission/Anesthesia Testing Clinical Review  Date: 11/09/20  Patient Demographics:  Name: Richard Rivas DOB:   [birth year] MRN:   [id number]  Planned Surgical Procedure(s):    Case: [id number] Date/Time: 11/12/20 0730   Procedure: XI ROBOTIC ASSISTED INGUINAL HERNIA REPAIR WITH MESH (Right: Groin)   Anesthesia type: General   Pre-op diagnosis: K40.90 non recurrent unilateral inguinal hernia w/o obstruction or gangrene   Location: [facility] OR ROOM 04 / [facility]   Surgeons: Herbert Pun, MD     NOTE: Available PAT nursing documentation and vital signs have been reviewed. Clinical nursing staff has updated patient's PMH/PSHx, current medication list, and drug allergies/intolerances to ensure comprehensive history available to assist in medical decision making as it pertains to the aforementioned surgical procedure and anticipated anesthetic course. Extensive review of available clinical information performed. [facility] and PSHx updated with any diagnoses/procedures that  may have been inadvertently omitted during his intake with the pre-admission testing department's nursing staff.  Clinical Discussion:  Richard Rivas is a 80 y.o. male who is submitted for pre-surgical anesthesia review and clearance prior to him undergoing the above procedure. Patient is a Former Smoker (quit 11/1994). Pertinent PMH includes: CAD (s/p CABG), MI, AAA, LEFT common iliac artery aneurysm, aortic atherosclerosis, HTN, HLD, GERD (no daily Tx), depression.  Patient is followed by cardiology Fletcher Anon, MD). He was last seen in the cardiology clinic on 11/02/2020; notes reviewed.  At the time of his clinic visit, patient doing well from a cardiovascular perspective.  He denied any chest pain, shortness of breath, PND, orthopnea, palpitations, significant peripheral edema, vertiginous symptoms, and presyncope/syncope.  Patient with a significant cardiovascular  history.  Patient suffered an NSTEMI on 02/12/2017.  Cardiac catheterization at that time revealed significant multivessel CAD; 99% distal LM, 99% ostial LCx, 50% distal LAD, 50% mid RCA, and 50% proximal RCA.  LVEF was normal at 55 to 65%.  Patient was transferred to [facility] for CABG procedure.  Patient underwent three-vessel CABG procedure on 02/13/2017.  LIMA-LAD, SVG-LCx, and SVG-PDA bypass grafts were placed.  Intraoperative TEE revealed normal left ventricular systolic function with a hyperdynamic LVEF of 65-70% post bypass.  There was no evidence of significant valvular insufficiency or stenosis.  Myocardial perfusion imaging study performed on 12/10/2018 revealed no evidence of stress-induced myocardial ischemia or arrhythmia; LVEF 70%.  Patient with known AAA and LEFT common iliac artery aneurysm.  Aneurysms last measured 3.0 cm (AAA) and 2.0 cm (LEFT common iliac artery) on 08/03/2020 (see full interpretation of cardiovascular testing and intervention below).  Patient on GDMT for his HTN and HLD diagnoses.  Blood pressure well controlled at 130/70 on prescribed nitrate therapy.  Patient is on a statin for ASCVD risk reduction in the setting of known HLD. Functional capacity, as defined by DASI, is documented as being >/= 4 METS.  No changes were made to patient's medication regimen.  Patient to follow-up with outpatient cardiology in 12 months or sooner if needed.  Patient is scheduled for an elective inguinal hernia repair on 11/12/2020 with Dr. Herbert Pun.  Given patient's past medical history significant for cardiovascular diagnoses, presurgical cardiac clearance was sought by the performing surgeon's office and PAT team.  Per cardiology, "RCRI places patient at class II risk, which corresponds to a 6% 30-day risk of death, MI, or cardiac arrest.  Based on ACC/AHA guidelines, patient would be at an ACCEPTABLE risk for planned procedure with no further cardiac testing  required". This patient is on daily antiplatelet therapy. He has been instructed on recommendations for holding his daily low-dose ASA for 5 days prior to his procedure with plans to restart as soon as postoperative bleeding risk felt to be minimized by his attending surgeon. The patient has been instructed that his last dose of his anticoagulant will be on 11/06/2020.  Patient denies previous perioperative complications with anesthesia in the past. In review of the available records, it is noted that patient underwent a general anesthetic course here (ASA III) in 01/2019 without documented complications.   Vitals with BMI 11/02/2020 09/04/2020 10/26/2019  Height '5\' 8"'  '5\' 8"'  '5\' 8"'   Weight 176 lbs 176 lbs 10 oz 179 lbs  BMI 26.77 93.81 82.99  Systolic 371 696 789  Diastolic 70 67 62  Pulse 72 68 64    Providers/Specialists:   NOTE: Primary physician provider listed below. Patient may have been seen by APP or partner within same practice.   PROVIDER ROLE / SPECIALTY LAST Tanna Savoy, MD  General Surgery  11/01/2020  Rusty Aus, MD  Primary Care Provider  10/31/2020  Marlyne Beards, MD  Cardiology  11/02/2020   Allergies:  Bee venom  Current Home Medications:   No current facility-administered medications for this encounter.    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)    a.) measured 2.0 cm on 08/03/2020   Aortic atherosclerosis (HCC)    BPPV (benign paroxysmal positional vertigo)    Colon polyps    Coronary artery disease    a.) s/p 3v CABG in 01/2017; LIMA-LAD, SVG-LCx, SVG-PDA   Depression    GERD (gastroesophageal reflux disease)     History of kidney stones    Hx of CABG 02/13/2017   3v; LIMA-LAD, SVG-LCx, SVG-PDA   Hypertension    Kidney stones    Myocardial infarction ([place]) 01/2017   Past Surgical History:  Procedure Laterality Date   CERVICAL DISC SURGERY     COLONOSCOPY WITH PROPOFOL N/A 10/11/2015   Procedure: COLONOSCOPY WITH PROPOFOL;  Surgeon: Manya Silvas, MD;  Location: [facility] ENDOSCOPY;  Service: Endoscopy;  Laterality: N/A;   COLONOSCOPY WITH PROPOFOL N/A 01/31/2019   Procedure: COLONOSCOPY WITH PROPOFOL;  Surgeon: Toledo, Benay Pike, MD;  Location: [facility] ENDOSCOPY;  Service: Gastroenterology;  Laterality: N/A;   CORONARY ARTERY BYPASS GRAFT N/A 02/13/2017   Procedure: CORONARY ARTERY BYPASS GRAFTING (CABG) x3 (LIMA to LAD, SVG to CIRCUMFLEX , SVG to PDA )  , using left internal mammary artery and right leg greater saphenous vein harvested endoscopically;  Surgeon: [name], MD;  Location: [place];  Service: Open Heart Surgery;  Laterality: N/A;   KIDNEY STONE SURGERY     LAPAROSCOPIC RIGHT COLECTOMY Right 11/14/2014   Procedure: LAPAROSCOPIC RIGHT COLECTOMY;  Surgeon: Leonie Green, MD;  Location: [facility] ORS;  Service: General;  Laterality: Right;   LEFT HEART CATH AND CORONARY ANGIOGRAPHY N/A 02/12/2017   Procedure: LEFT HEART CATH AND CORONARY ANGIOGRAPHY and possible pci;  Surgeon: Yolonda Kida, MD;  Location: [place] [facility];  Service: Cardiovascular;  Laterality: N/A;  SHOULDER ARTHROSCOPY W/ ROTATOR CUFF REPAIR Right    TEE WITHOUT CARDIOVERSION N/A 02/13/2017   Procedure: TRANSESOPHAGEAL ECHOCARDIOGRAM (TEE);  Surgeon: [name], MD;  Location: [place];  Service: Open Heart Surgery;  Laterality: N/A;   Family History  Problem Relation Age of Onset   Heart attack Father    Heart attack Brother    Social History   Tobacco Use   Smoking status: Former    Years: 40.00    Pack years: 0.00    Types: Cigarettes    Quit date: 11/06/1994    Years since quitting: 26.0    Smokeless tobacco: Former    Quit date: 11/05/2012  Vaping Use   Vaping Use: Never used  Substance Use Topics   Alcohol use: Yes    Comment: beer occasionally   Drug use: No    Pertinent Clinical Results:  LABS: Labs reviewed: Acceptable for surgery.   Ref Range & Units 1 mo ago  WBC (White Blood Cell Count) 4.1 - 10.2 10^3/uL 8.8   RBC (Red Blood Cell Count) 4.69 - 6.13 10^6/uL 5.08   Hemoglobin 14.1 - 18.1 gm/dL 14.6   Hematocrit 40.0 - 52.0 % 45.9   MCV (Mean Corpuscular Volume) 80.0 - 100.0 fl 90.4   MCH (Mean Corpuscular Hemoglobin) 27.0 - 31.2 pg 28.7   MCHC (Mean Corpuscular Hemoglobin Concentration) 32.0 - 36.0 gm/dL 31.8 Low    Platelet Count 150 - 450 10^3/uL 173   RDW-CV (Red Cell Distribution Width) 11.6 - 14.8 % 14.2   MPV (Mean Platelet Volume) 9.4 - 12.4 fl 11.4   Neutrophils 1.50 - 7.80 10^3/uL 5.39   Lymphocytes 1.00 - 3.60 10^3/uL 2.12   Monocytes 0.00 - 1.50 10^3/uL 0.93   Eosinophils 0.00 - 0.55 10^3/uL 0.25   Basophils 0.00 - 0.09 10^3/uL 0.06   Neutrophil % 32.0 - 70.0 % 61.5   Lymphocyte % 10.0 - 50.0 % 24.2   Monocyte % 4.0 - 13.0 % 10.6   Eosinophil % 1.0 - 5.0 % 2.9   Basophil% 0.0 - 2.0 % 0.7   Immature Granulocyte % <=0.7 % 0.1   Immature Granulocyte Count <=0.06 10^3/L 0.01   Resulting Agency  [facility] - LAB  Specimen Collected: 10/01/20 07:12 Last Resulted: 10/01/20 08:20  Received From: [facility]  Result Received: 11/01/20 09:46     Ref Range & Units 1 mo ago  Glucose 70 - 110 mg/dL 94   Sodium 136 - 145 mmol/L 143   Potassium 3.6 - 5.1 mmol/L 4.3   Chloride 97 - 109 mmol/L 109   Carbon Dioxide (CO2) 22.0 - 32.0 mmol/L 26.4   Urea Nitrogen (BUN) 7 - 25 mg/dL 20   Creatinine 0.7 - 1.3 mg/dL 0.9   Glomerular Filtration Rate (eGFR), MDRD Estimate >60 mL/min/1.73sq m 82   Calcium 8.7 - 10.3 mg/dL 9.2   AST  8 - 39 U/L 19   ALT  6 - 57 U/L 18   Alk Phos (alkaline Phosphatase) 34 - 104 U/L 45   Albumin 3.5 -  4.8 g/dL 4.2   Bilirubin, Total 0.3 - 1.2 mg/dL 0.9   Protein, Total 6.1 - 7.9 g/dL 6.5   A/G Ratio 1.0 - 5.0 gm/dL 1.8   Resulting Agency  [place] - [facility]  Specimen Collected: 10/01/20 07:12 Last Resulted: 10/01/20 11:19  Received From: [facility]  Result Received: 11/01/20 09:46     ECG: Date: 11/02/2020 Time ECG obtained:  0923 AM Rate: 72 bpm Rhythm:  Sinus rhythm with PACs and PVCs Axis (leads I and aVF): Normal Intervals: PR 160 ms. QRS 82 ms. QTc 427 ms. ST segment and T wave changes: No evidence of acute ST segment elevation or depression Comparison: Similar to previous tracing obtained on 10/26/2019   IMAGING / PROCEDURES: VASCULAR ULTRASOUND ABI WITH/WITHOUT TBI performed on 09/04/2020 Right:  Resting right ankle-brachial index is within normal range.  No evidence of significant right lower extremity arterial disease.  The right toe-brachial index is normal.  Left:  Resting left ankle-brachial index is within normal range.  No evidence of significant left lower extremity arterial disease.  The left toe-brachial index is normal.   AORTIC DUPLEX LIMITED performed on 11/12/2018 Aortoiliac atherosclerosis with ectasia. Maximal aortic diameter 2.8 cm. Maximal left iliac diameter 1.9 cm. Ectatic abdominal aorta at risk for aneurysm development. Recommend follow up by ultrasound in 5 years.  TRANSESOPHAGEAL ECHOCARDIOGRAM performed on 02/13/2017 LVEF 55-65% prior to bypass with no evidence of regional wall motion abnormalities Post CPB exam revealed vigorous LV function with hyperdynamic contractility (LVEF 65-70%) and no regional wall motion abnormalities. Trivial MR TR and TR No AR No valvular stenosis No evidence of a pericardial effusion  CORONARY ARTERY BYPASS GRAFTING performed on 02/13/2017 Three-vessel CABG procedure LIMA-LAD SVG-LCx SVG-PDA  LEFT HEART CATHETERIZATION AND CORONARY ANGIOGRAPHY performed on 02/12/2017 LVEF 55  to 65% LVEDP normal Moderate to severe multivessel CAD 50% stenosis of the mid RCA 50% stenosis of the proximal RCA 99% stenosis of LM 99% stenosis of ostial LCx 50% stenosis of the distal LAD Recommendation: Transfer to [facility] for CABG procedure    Impression and Plan:  ANHAD SHEELEY has been referred for pre-anesthesia review and clearance prior to him undergoing the planned anesthetic and procedural courses. Available labs, pertinent testing, and imaging results were personally reviewed by me. This patient has been appropriately cleared by cardiology with an overall ACCEPTABLE risk of significant perioperative cardiovascular complications.  Based on clinical review performed today (11/09/20), barring any significant acute changes in the patient's overall condition, it is anticipated that he will be able to proceed with the planned surgical intervention. Any acute changes in clinical condition may necessitate his procedure being postponed and/or cancelled. Patient will meet with anesthesia team (MD and/or CRNA) on the day of his procedure for preoperative evaluation/assessment. Questions regarding anesthetic course will be fielded at that time.   Pre-surgical instructions were reviewed with the patient during his PAT appointment and questions were fielded by PAT clinical staff. Patient was advised that if any questions or concerns arise prior to his procedure then he should return a call to PAT and/or his surgeon's office to discuss.  Honor Loh, MSN, APRN, FNP-C, CEN [facility]  Peri-operative Services Nurse Practitioner Phone: [phone] 11/09/20 9:10 AM  NOTE: This note has been prepared using Dragon dictation software. Despite my best ability to proofread, there is always the potential that unintentional transcriptional errors may still occur from this process.

## 2020-11-11 MED ORDER — ORAL CARE MOUTH RINSE: 15.0000 mL | Freq: Once | OROMUCOSAL | Status: AC

## 2020-11-11 MED ORDER — CEFAZOLIN SODIUM-DEXTROSE 2-4 GM/100ML-% IV SOLN
2.0000 g | INTRAVENOUS | Status: AC
  Administered 2020-11-12: 2 g via INTRAVENOUS

## 2020-11-11 MED ORDER — CHLORHEXIDINE GLUCONATE 0.12 % MT SOLN
15.0000 mL | Freq: Once | OROMUCOSAL | Status: AC
  Administered 2020-11-12: 15 mL via OROMUCOSAL

## 2020-11-11 MED ORDER — LACTATED RINGERS IV SOLN: INTRAVENOUS | Status: DC

## 2020-11-11 MED ORDER — FAMOTIDINE 20 MG PO TABS
20.0000 mg | ORAL_TABLET | Freq: Once | ORAL | Status: AC
  Administered 2020-11-12: 20 mg via ORAL

## 2020-11-12 ENCOUNTER — Encounter: Payer: Self-pay | Admitting: General Surgery

## 2020-11-12 ENCOUNTER — Ambulatory Visit: Payer: PPO | Admitting: Urgent Care

## 2020-11-12 ENCOUNTER — Encounter
Admission: RE | Disposition: A | Discharge status: Home / Self Care | Payer: Self-pay | Source: Home / Self Care | Attending: General Surgery

## 2020-11-12 ENCOUNTER — Ambulatory Visit
Admission: RE | Admit: 2020-11-12 | Discharge: 2020-11-12 | Disposition: A | Discharge status: Home / Self Care | Payer: PPO | Attending: General Surgery | Admitting: General Surgery

## 2020-11-12 ENCOUNTER — Other Ambulatory Visit: Payer: Self-pay

## 2020-11-12 DIAGNOSIS — Z9049 Acquired absence of other specified parts of digestive tract: Secondary | ICD-10-CM | POA: Diagnosis not present

## 2020-11-12 DIAGNOSIS — Z79899 Other long term (current) drug therapy: Secondary | ICD-10-CM | POA: Insufficient documentation

## 2020-11-12 DIAGNOSIS — Z7982 Long term (current) use of aspirin: Secondary | ICD-10-CM | POA: Insufficient documentation

## 2020-11-12 DIAGNOSIS — K4090 Unilateral inguinal hernia, without obstruction or gangrene, not specified as recurrent: Secondary | ICD-10-CM | POA: Insufficient documentation

## 2020-11-12 DIAGNOSIS — Z87891 Personal history of nicotine dependence: Secondary | ICD-10-CM | POA: Diagnosis not present

## 2020-11-12 DIAGNOSIS — Z91030 Bee allergy status: Secondary | ICD-10-CM | POA: Diagnosis not present

## 2020-11-12 DIAGNOSIS — I2510 Atherosclerotic heart disease of native coronary artery without angina pectoris: Secondary | ICD-10-CM | POA: Diagnosis not present

## 2020-11-12 DIAGNOSIS — K219 Gastro-esophageal reflux disease without esophagitis: Secondary | ICD-10-CM | POA: Diagnosis not present

## 2020-11-12 DIAGNOSIS — Z951 Presence of aortocoronary bypass graft: Secondary | ICD-10-CM | POA: Diagnosis not present

## 2020-11-12 HISTORY — DX: Abdominal aortic aneurysm, without rupture: I71.4

## 2020-11-12 HISTORY — DX: Hyperlipidemia, unspecified: E78.5

## 2020-11-12 HISTORY — DX: Aneurysm of iliac artery: I72.3

## 2020-11-12 HISTORY — DX: Atherosclerosis of aorta: I70.0

## 2020-11-12 HISTORY — PX: XI ROBOTIC ASSISTED INGUINAL HERNIA REPAIR WITH MESH: SHX6706

## 2020-11-12 HISTORY — DX: Abdominal aortic aneurysm, without rupture, unspecified: I71.40

## 2020-11-12 SURGERY — REPAIR, HERNIA, INGUINAL, ROBOT-ASSISTED, LAPAROSCOPIC, USING MESH
Anesthesia: General | Site: Groin | Laterality: Right

## 2020-11-12 MED ORDER — FAMOTIDINE 20 MG PO TABS
ORAL_TABLET | ORAL | Status: AC
  Filled 2020-11-12: qty 1

## 2020-11-12 MED ORDER — ACETAMINOPHEN 10 MG/ML IV SOLN
INTRAVENOUS | Status: AC
  Filled 2020-11-12: qty 100

## 2020-11-12 MED ORDER — FENTANYL CITRATE (PF) 100 MCG/2ML IJ SOLN
INTRAMUSCULAR | Status: DC | PRN
  Administered 2020-11-12: 50 ug via INTRAVENOUS
  Administered 2020-11-12 (×2): 25 ug via INTRAVENOUS

## 2020-11-12 MED ORDER — OXYCODONE HCL 5 MG PO TABS: 5.0000 mg | ORAL_TABLET | Freq: Once | ORAL | Status: AC

## 2020-11-12 MED ORDER — ONDANSETRON HCL 4 MG/2ML IJ SOLN
INTRAMUSCULAR | Status: AC
  Filled 2020-11-12: qty 2

## 2020-11-12 MED ORDER — OXYCODONE HCL 5 MG PO TABS
ORAL_TABLET | ORAL | Status: AC
  Administered 2020-11-12: 5 mg via ORAL
  Filled 2020-11-12: qty 1

## 2020-11-12 MED ORDER — DEXAMETHASONE SODIUM PHOSPHATE 10 MG/ML IJ SOLN
INTRAMUSCULAR | Status: AC
  Filled 2020-11-12: qty 1

## 2020-11-12 MED ORDER — FENTANYL CITRATE (PF) 100 MCG/2ML IJ SOLN
25.0000 ug | INTRAMUSCULAR | Status: DC | PRN
  Administered 2020-11-12 (×3): 25 ug via INTRAVENOUS

## 2020-11-12 MED ORDER — HYDROCODONE-ACETAMINOPHEN 5-325 MG PO TABS: 1.0000 | ORAL_TABLET | ORAL | 0 refills | Status: AC | PRN

## 2020-11-12 MED ORDER — BUPIVACAINE-EPINEPHRINE 0.25% -1:200000 IJ SOLN
INTRAMUSCULAR | Status: DC | PRN
  Administered 2020-11-12: 30 mL

## 2020-11-12 MED ORDER — PHENYLEPHRINE HCL (PRESSORS) 10 MG/ML IV SOLN
INTRAVENOUS | Status: AC
  Filled 2020-11-12: qty 1

## 2020-11-12 MED ORDER — ROCURONIUM BROMIDE 10 MG/ML (PF) SYRINGE
PREFILLED_SYRINGE | INTRAVENOUS | Status: AC
  Filled 2020-11-12: qty 10

## 2020-11-12 MED ORDER — LACTATED RINGERS IV SOLN: INTRAVENOUS | Status: DC | PRN

## 2020-11-12 MED ORDER — LIDOCAINE HCL (CARDIAC) PF 100 MG/5ML IV SOSY
PREFILLED_SYRINGE | INTRAVENOUS | Status: DC | PRN
  Administered 2020-11-12: 100 mg via INTRAVENOUS

## 2020-11-12 MED ORDER — ROCURONIUM BROMIDE 100 MG/10ML IV SOLN
INTRAVENOUS | Status: DC | PRN
  Administered 2020-11-12: 20 mg via INTRAVENOUS
  Administered 2020-11-12: 40 mg via INTRAVENOUS

## 2020-11-12 MED ORDER — GLYCOPYRROLATE 0.2 MG/ML IJ SOLN
INTRAMUSCULAR | Status: DC | PRN
  Administered 2020-11-12: .2 mg via INTRAVENOUS

## 2020-11-12 MED ORDER — ACETAMINOPHEN 10 MG/ML IV SOLN
INTRAVENOUS | Status: DC | PRN
  Administered 2020-11-12: 1000 mg via INTRAVENOUS

## 2020-11-12 MED ORDER — GLYCOPYRROLATE 0.2 MG/ML IJ SOLN
INTRAMUSCULAR | Status: AC
  Filled 2020-11-12: qty 1

## 2020-11-12 MED ORDER — SUGAMMADEX SODIUM 500 MG/5ML IV SOLN
INTRAVENOUS | Status: DC | PRN
  Administered 2020-11-12: 350 mg via INTRAVENOUS

## 2020-11-12 MED ORDER — FENTANYL CITRATE (PF) 100 MCG/2ML IJ SOLN
INTRAMUSCULAR | Status: AC
  Administered 2020-11-12: 25 ug via INTRAVENOUS
  Filled 2020-11-12: qty 2

## 2020-11-12 MED ORDER — ONDANSETRON HCL 4 MG/2ML IJ SOLN: 4.0000 mg | Freq: Once | INTRAMUSCULAR | Status: DC | PRN

## 2020-11-12 MED ORDER — LIDOCAINE HCL (PF) 2 % IJ SOLN
INTRAMUSCULAR | Status: AC
  Filled 2020-11-12: qty 5

## 2020-11-12 MED ORDER — PROPOFOL 10 MG/ML IV BOLUS
INTRAVENOUS | Status: DC | PRN
  Administered 2020-11-12: 140 mg via INTRAVENOUS

## 2020-11-12 MED ORDER — DEXAMETHASONE SODIUM PHOSPHATE 10 MG/ML IJ SOLN
INTRAMUSCULAR | Status: DC | PRN
  Administered 2020-11-12: 10 mg via INTRAVENOUS

## 2020-11-12 MED ORDER — CEFAZOLIN SODIUM-DEXTROSE 2-4 GM/100ML-% IV SOLN
INTRAVENOUS | Status: AC
  Filled 2020-11-12: qty 100

## 2020-11-12 MED ORDER — CHLORHEXIDINE GLUCONATE 0.12 % MT SOLN
OROMUCOSAL | Status: AC
  Filled 2020-11-12: qty 15

## 2020-11-12 MED ORDER — SUGAMMADEX SODIUM 500 MG/5ML IV SOLN
INTRAVENOUS | Status: AC
  Filled 2020-11-12: qty 5

## 2020-11-12 MED ORDER — BUPIVACAINE-EPINEPHRINE (PF) 0.25% -1:200000 IJ SOLN
INTRAMUSCULAR | Status: AC
  Filled 2020-11-12: qty 30

## 2020-11-12 MED ORDER — FENTANYL CITRATE (PF) 100 MCG/2ML IJ SOLN
INTRAMUSCULAR | Status: AC
  Filled 2020-11-12: qty 2

## 2020-11-12 MED ORDER — ONDANSETRON HCL 4 MG/2ML IJ SOLN
INTRAMUSCULAR | Status: DC | PRN
  Administered 2020-11-12: 4 mg via INTRAVENOUS

## 2020-11-12 SURGICAL SUPPLY — 57 items
ADH SKN CLS APL DERMABOND .7 (GAUZE/BANDAGES/DRESSINGS) ×1
APL PRP STRL LF DISP 70% ISPRP (MISCELLANEOUS) ×1
BAG INFUSER PRESSURE 100CC (MISCELLANEOUS) IMPLANT
BLADE SURG SZ11 CARB STEEL (BLADE) ×3 IMPLANT
CANISTER SUCT 1200ML W/VALVE (MISCELLANEOUS) IMPLANT
CANNULA REDUC XI 12-8 STAPL (CANNULA) ×1
CANNULA REDUC XI 12-8MM STAPL (CANNULA) ×1
CANNULA REDUCER 12-8 DVNC XI (CANNULA) ×1 IMPLANT
CHLORAPREP W/TINT 26 (MISCELLANEOUS) ×3 IMPLANT
COVER TIP SHEARS 8 DVNC (MISCELLANEOUS) ×1 IMPLANT
COVER TIP SHEARS 8MM DA VINCI (MISCELLANEOUS) ×2
COVER WAND RF STERILE (DRAPES) ×6 IMPLANT
DEFOGGER SCOPE WARMER CLEARIFY (MISCELLANEOUS) ×3 IMPLANT
DERMABOND ADVANCED (GAUZE/BANDAGES/DRESSINGS) ×2
DERMABOND ADVANCED .7 DNX12 (GAUZE/BANDAGES/DRESSINGS) ×1 IMPLANT
DRAPE ARM DVNC X/XI (DISPOSABLE) ×3 IMPLANT
DRAPE COLUMN DVNC XI (DISPOSABLE) ×1 IMPLANT
DRAPE DA VINCI XI ARM (DISPOSABLE) ×6
DRAPE DA VINCI XI COLUMN (DISPOSABLE) ×2
ELECT REM PT RETURN 9FT ADLT (ELECTROSURGICAL) ×3
ELECTRODE REM PT RTRN 9FT ADLT (ELECTROSURGICAL) ×1 IMPLANT
GLOVE SURG ENC MOIS LTX SZ6.5 (GLOVE) ×9 IMPLANT
GLOVE SURG UNDER POLY LF SZ6.5 (GLOVE) ×9 IMPLANT
GOWN STRL REUS W/ TWL LRG LVL3 (GOWN DISPOSABLE) ×3 IMPLANT
GOWN STRL REUS W/TWL LRG LVL3 (GOWN DISPOSABLE) ×9
GRASPER SUT TROCAR 14GX15 (MISCELLANEOUS) ×3 IMPLANT
IRRIGATOR SUCT 8 DISP DVNC XI (IRRIGATION / IRRIGATOR) IMPLANT
IRRIGATOR SUCTION 8MM XI DISP (IRRIGATION / IRRIGATOR)
IV CATH ANGIO 12GX3 LT BLUE (NEEDLE) IMPLANT
IV NS 1000ML (IV SOLUTION)
IV NS 1000ML BAXH (IV SOLUTION) IMPLANT
KIT PINK PAD W/HEAD ARE REST (MISCELLANEOUS) ×3
KIT PINK PAD W/HEAD ARM REST (MISCELLANEOUS) ×1 IMPLANT
LABEL OR SOLS (LABEL) IMPLANT
MANIFOLD NEPTUNE II (INSTRUMENTS) ×3 IMPLANT
MESH 3DMAX 5X7 RT XLRG (Mesh General) ×3 IMPLANT
NEEDLE HYPO 22GX1.5 SAFETY (NEEDLE) ×3 IMPLANT
NEEDLE INSUFFLATION 14GA 120MM (NEEDLE) ×3 IMPLANT
OBTURATOR OPTICAL STANDARD 8MM (TROCAR) ×2
OBTURATOR OPTICAL STND 8 DVNC (TROCAR) ×1
OBTURATOR OPTICALSTD 8 DVNC (TROCAR) ×1 IMPLANT
PACK LAP CHOLECYSTECTOMY (MISCELLANEOUS) ×3 IMPLANT
SEAL CANN UNIV 5-8 DVNC XI (MISCELLANEOUS) ×3 IMPLANT
SEAL XI 5MM-8MM UNIVERSAL (MISCELLANEOUS) ×6
SET TUBE SMOKE EVAC HIGH FLOW (TUBING) ×3 IMPLANT
SOLUTION ELECTROLUBE (MISCELLANEOUS) ×3 IMPLANT
STAPLER CANNULA SEAL DVNC XI (STAPLE) ×1 IMPLANT
STAPLER CANNULA SEAL XI (STAPLE) ×2
SUT MNCRL 4-0 (SUTURE) ×3
SUT MNCRL 4-0 27XMFL (SUTURE) ×1
SUT VIC AB 2-0 SH 27 (SUTURE) ×3
SUT VIC AB 2-0 SH 27XBRD (SUTURE) ×1 IMPLANT
SUT VICRYL 0 AB UR-6 (SUTURE) ×3 IMPLANT
SUT VLOC 90 S/L VL9 GS22 (SUTURE) ×3 IMPLANT
SUTURE MNCRL 4-0 27XMF (SUTURE) ×1 IMPLANT
TAPE TRANSPORE STRL 2 31045 (GAUZE/BANDAGES/DRESSINGS) IMPLANT
TRAY FOLEY MTR SLVR 16FR STAT (SET/KITS/TRAYS/PACK) ×3 IMPLANT

## 2020-11-12 NOTE — Anesthesia Postprocedure Evaluation (Signed)
Anesthesia Post Note  Patient: Richard Rivas  Procedure(s) Performed: XI ROBOTIC ASSISTED INGUINAL HERNIA REPAIR WITH MESH (Right: Groin)  Patient location during evaluation: PACU Anesthesia Type: General Level of consciousness: awake and alert Pain management: pain level controlled Vital Signs Assessment: post-procedure vital signs reviewed and stable Respiratory status: spontaneous breathing and respiratory function stable Cardiovascular status: stable Anesthetic complications: no   No notable events documented.   Last Vitals:  Vitals:   11/12/20 1030 11/12/20 1045  BP: 131/69 (!) 148/47  Pulse: (!) 55 (!) 55  Resp: 16 16  Temp: (!) 36.1 C (!) 36.2 C  SpO2: 95% 98%    Last Pain:  Vitals:   11/12/20 1045  TempSrc: Temporal  PainSc: 3                  Roman Dubuc K

## 2020-11-12 NOTE — Transfer of Care (Signed)
Immediate Anesthesia Transfer of Care Note  Patient: Richard Rivas  Procedure(s) Performed: XI ROBOTIC ASSISTED INGUINAL HERNIA REPAIR WITH MESH (Right: Groin)  Patient Location: PACU  Anesthesia Type:General  Level of Consciousness: sedated  Airway & Oxygen Therapy: Patient Spontanous Breathing and Patient connected to face mask oxygen  Post-op Assessment: Report given to RN and Post -op Vital signs reviewed and stable  Post vital signs: Reviewed and stable  Last Vitals:  Vitals Value Taken Time  BP 156/69 11/12/20 0930  Temp 36.6 C 11/12/20 0930  Pulse 56 11/12/20 0935  Resp 18 11/12/20 0935  SpO2 100 % 11/12/20 0935  Vitals shown include unvalidated device data.  Last Pain:  Vitals:   11/12/20 0658  TempSrc:   PainSc: 0-No pain         Complications: No notable events documented.

## 2020-11-12 NOTE — Interval H&P Note (Signed)
History and Physical Interval Note:  11/12/2020 6:53 AM  Richard Rivas  has presented today for surgery, with the diagnosis of K40.90 non recurrent unilateral inguinal hernia w/o obstruction or gangrene.  The various methods of treatment have been discussed with the patient and family. After consideration of risks, benefits and other options for treatment, the patient has consented to  Procedure(s): XI ROBOTIC [place] (Right) as a surgical intervention.  The patient's history has been reviewed, patient examined, no change in status, stable for surgery.  I have reviewed the patient's chart and labs.  Right groin marked in the pre procedure room. Questions were answered to the patient's satisfaction.     Herbert Pun

## 2020-11-12 NOTE — Anesthesia Procedure Notes (Signed)
Anesthesia Procedure Note     

## 2020-11-12 NOTE — Anesthesia Preprocedure Evaluation (Signed)
Anesthesia Evaluation  Patient identified by MRN, date of birth, ID band Patient awake    Reviewed: Allergy & Precautions, NPO status , Patient's Chart, lab work & pertinent test results  History of Anesthesia Complications Negative for: history of anesthetic complications  Airway Mallampati: II       Dental   Pulmonary neg sleep apnea, neg COPD, Not current smoker, former smoker,           Cardiovascular (-) hypertension+ Past MI and + CABG  (-) CHF (-) pacemaker(-) Valvular Problems/Murmurs     Neuro/Psych neg Seizures Depression    GI/Hepatic Neg liver ROS, neg GERD  ,  Endo/Other  neg diabetes  Renal/GU Renal disease (stones)     Musculoskeletal   Abdominal   Peds  Hematology   Anesthesia Other Findings   Reproductive/Obstetrics                             Anesthesia Physical Anesthesia Plan  ASA: 3  Anesthesia Plan: General   Post-op Pain Management:    Induction: Intravenous  PONV Risk Score and Plan: 2 and Ondansetron and Dexamethasone  Airway Management Planned: Oral ETT  Additional Equipment:   Intra-op Plan:   Post-operative Plan:   Informed Consent: I have reviewed the patients History and Physical, chart, labs and discussed the procedure including the risks, benefits and alternatives for the proposed anesthesia with the patient or authorized representative who has indicated his/her understanding and acceptance.       Plan Discussed with:   Anesthesia Plan Comments:         Anesthesia Quick Evaluation

## 2020-11-12 NOTE — Discharge Instructions (Addendum)
  Diet: Resume home heart healthy regular diet.   Activity: No heavy lifting >20 pounds (children, pets, laundry, garbage) or strenuous activity until follow-up, but light activity and walking are encouraged. Do not drive or drink alcohol if taking narcotic pain medications.  Wound care: May shower with soapy water and pat dry (do not rub incisions), but no baths or submerging incision underwater until follow-up. (no swimming)   Medications: Resume all home medications. For mild to moderate pain: acetaminophen (Tylenol) ***or ibuprofen (if no kidney disease). Combining Tylenol with alcohol can substantially increase your risk of causing liver disease. Narcotic pain medications, if prescribed, can be used for severe pain, though may cause nausea, constipation, and drowsiness. Do not combine Tylenol and Norco within a 6 hour period as Norco contains Tylenol. If you do not need the narcotic pain medication, you do not need to fill the prescription.  Call office ([phone]) at any time if any questions, worsening pain, fevers/chills, bleeding, drainage from incision site, or other concerns.   AMBULATORY SURGERY  DISCHARGE INSTRUCTIONS   The drugs that you were given will stay in your system until tomorrow so for the next 24 hours you should not:  Drive an automobile Make any legal decisions Drink any alcoholic beverage   You may resume regular meals tomorrow.  Today it is better to start with liquids and gradually work up to solid foods.  You may eat anything you prefer, but it is better to start with liquids, then soup and crackers, and gradually work up to solid foods.   Please notify your doctor immediately if you have any unusual bleeding, trouble breathing, redness and pain at the surgery site, drainage, fever, or pain not relieved by medication.    Additional Instructions:        Please contact your physician with any problems or Same Day Surgery at [phone], Monday  through Friday 6 am to 4 pm, or [place] at [place] Main number at [phone].  

## 2020-11-12 NOTE — Anesthesia Procedure Notes (Signed)
Procedure Name: Intubation Date/Time: 11/12/2020 7:46 AM Performed by: Justus Memory, CRNA Pre-anesthesia Checklist: Patient identified, Patient being monitored, Timeout performed, Emergency Drugs available and Suction available Patient Re-evaluated:Patient Re-evaluated prior to induction Oxygen Delivery Method: Circle system utilized Preoxygenation: Pre-oxygenation with 100% oxygen Induction Type: IV induction Ventilation: Mask ventilation with difficulty and Oral airway inserted - appropriate to patient size Laryngoscope Size: Mac, 3 and McGraph Grade View: Grade I Tube type: Oral Tube size: 7.0 mm Number of attempts: 1 Airway Equipment and Method: Stylet and Video-laryngoscopy Placement Confirmation: ETT inserted through vocal cords under direct vision, positive ETCO2 and breath sounds checked- equal and bilateral Secured at: 21 cm Tube secured with: Tape Dental Injury: Teeth and Oropharynx as per pre-operative assessment  Difficulty Due To: Difficulty was unanticipated

## 2020-11-12 NOTE — Op Note (Signed)
Preoperative diagnosis: Right inguinal hernia.   Postoperative diagnosis: Right inguinal hernia.  Procedure: Robotic assisted Laparoscopic Transabdominal preperitoneal laparoscopic (TAPP) repair of right inguinal hernia.  Anesthesia: GETA  Surgeon: Dr. Windell Moment  Wound Classification: Clean  Indications:  Patient is a 80 y.o. male developed a symptomatic right inguinal hernia. Repair was indicated.  Findings: 1. Right indirect Inguinal hernia identified   2. Vas deferens and cord structures identified and preserved 3. Bard 3D Max mesh used for repair 4. Adequate hemostasis.   Description of procedure: The patient was taken to the operating room and the correct side of surgery was verified. The patient was placed supine with arms tucked at the sides. After obtaining adequate anesthesia, the patient's abdomen was prepped and draped in standard sterile fashion. The patient was placed in the Trendelenburg position. A time-out was completed verifying correct patient, procedure, site, positioning, and implant(s) and/or special equipment prior to beginning this procedure. A Veress needle was placed at the umbilicus and pneumoperitoneum created with insufflation of carbon dioxide to 15 mmHg. After the Veress needle was removed, an 8-mm trocar was placed on epigastric area and the 30 angled laparoscope inserted. Two 8-mm trocars were then placed lateral to the rectus sheath under direct visualization. Both inguinal regions were inspected and the median umbilical ligament, medial umbilical ligament, and lateral umbilical fold were identified.  The robotic arms were docked. The robotic scope was inserted and the pelvic area anatomy targeted.  The peritoneum was incised with scissors along a line 5 cm above the superior edge of the hernia defect, extending from the median umbilical ligament to the anterior superior iliac spine. The peritoneal flap was mobilized inferiorly using blunt and sharp  dissection. The inferior epigastric vessels were exposed and the pubic symphysis was identified. Cooper's ligament was dissected to its junction with the iliac vein. The dissection was continued inferiorly to the iliopubic tract, with care taken to avoid injury to the femoral branch of the genitofemoral nerve and the lateral femoral cutaneous nerve. The cord structures were parietalized. The hernia was identified and reduced by gentle traction.  The indirect hernia sac was noted mobilized from the cord structures and reduced into the peritoneal cavity.  A large piece of mesh was rolled longitudinally into a compact cylinder and passed through a trocar. The cylinder was placed along the inferior aspect of the working space and unrolled into place to completely cover the direct, indirect, and femoral spaces. The mesh was secured into place superiorly to the anterior abdominal wall and inferiorly and medially to Cooper's ligament with absorbable sutures. Care was taken to avoid the inferolateral triangles containing the iliac vessels and genital nerves. The peritoneal flap was closed over the mesh and secured with suture in similar positions of safety. After ensuring adequate hemostasis, the trocars were removed and the pneumoperitoneum allowed to escape. The trocar incisions were closed using monocryl and skin adhesive dressings applied.  The patient tolerated the procedure well and was taken to the postanesthesia care unit in stable condition.   Specimen: None  Complications: None  Estimated Blood Loss: 10 mL

## 2020-11-19 DIAGNOSIS — M3501 Sicca syndrome with keratoconjunctivitis: Secondary | ICD-10-CM | POA: Diagnosis not present

## 2020-12-27 DIAGNOSIS — H02055 Trichiasis without entropian left lower eyelid: Secondary | ICD-10-CM | POA: Diagnosis not present

## 2021-04-08 DIAGNOSIS — E538 Deficiency of other specified B group vitamins: Secondary | ICD-10-CM | POA: Diagnosis not present

## 2021-04-08 DIAGNOSIS — E782 Mixed hyperlipidemia: Secondary | ICD-10-CM | POA: Diagnosis not present

## 2021-04-10 DIAGNOSIS — R739 Hyperglycemia, unspecified: Secondary | ICD-10-CM | POA: Diagnosis not present

## 2021-04-10 DIAGNOSIS — M5116 Intervertebral disc disorders with radiculopathy, lumbar region: Secondary | ICD-10-CM | POA: Diagnosis not present

## 2021-04-10 DIAGNOSIS — E782 Mixed hyperlipidemia: Secondary | ICD-10-CM | POA: Diagnosis not present

## 2021-04-10 DIAGNOSIS — I25810 Atherosclerosis of coronary artery bypass graft(s) without angina pectoris: Secondary | ICD-10-CM | POA: Diagnosis not present

## 2021-04-10 DIAGNOSIS — M5136 Other intervertebral disc degeneration, lumbar region: Secondary | ICD-10-CM | POA: Diagnosis not present

## 2021-04-10 DIAGNOSIS — Z125 Encounter for screening for malignant neoplasm of prostate: Secondary | ICD-10-CM | POA: Diagnosis not present

## 2021-04-10 DIAGNOSIS — E538 Deficiency of other specified B group vitamins: Secondary | ICD-10-CM | POA: Diagnosis not present

## 2021-05-06 DIAGNOSIS — R200 Anesthesia of skin: Secondary | ICD-10-CM | POA: Diagnosis not present

## 2021-05-06 DIAGNOSIS — H353132 Nonexudative age-related macular degeneration, bilateral, intermediate dry stage: Secondary | ICD-10-CM | POA: Diagnosis not present

## 2021-05-06 DIAGNOSIS — E538 Deficiency of other specified B group vitamins: Secondary | ICD-10-CM | POA: Diagnosis not present

## 2021-05-06 DIAGNOSIS — M5416 Radiculopathy, lumbar region: Secondary | ICD-10-CM | POA: Diagnosis not present

## 2021-05-07 ENCOUNTER — Other Ambulatory Visit: Payer: Self-pay | Admitting: Internal Medicine

## 2021-05-07 DIAGNOSIS — M5416 Radiculopathy, lumbar region: Secondary | ICD-10-CM

## 2021-05-07 DIAGNOSIS — R200 Anesthesia of skin: Secondary | ICD-10-CM

## 2021-06-04 ENCOUNTER — Ambulatory Visit
Admission: RE | Admit: 2021-06-04 | Discharge: 2021-06-04 | Disposition: A | Discharge status: Home / Self Care | Payer: PPO | Source: Ambulatory Visit | Attending: Internal Medicine | Admitting: Internal Medicine

## 2021-06-04 DIAGNOSIS — M5416 Radiculopathy, lumbar region: Secondary | ICD-10-CM | POA: Insufficient documentation

## 2021-06-04 DIAGNOSIS — R200 Anesthesia of skin: Secondary | ICD-10-CM | POA: Diagnosis not present

## 2021-06-04 DIAGNOSIS — M5136 Other intervertebral disc degeneration, lumbar region: Secondary | ICD-10-CM | POA: Diagnosis not present

## 2021-06-04 DIAGNOSIS — M5116 Intervertebral disc disorders with radiculopathy, lumbar region: Secondary | ICD-10-CM | POA: Diagnosis not present

## 2021-06-18 DIAGNOSIS — M48062 Spinal stenosis, lumbar region with neurogenic claudication: Secondary | ICD-10-CM | POA: Diagnosis not present

## 2021-06-18 DIAGNOSIS — I10 Essential (primary) hypertension: Secondary | ICD-10-CM | POA: Diagnosis not present

## 2021-06-18 DIAGNOSIS — Z6827 Body mass index (BMI) 27.0-27.9, adult: Secondary | ICD-10-CM | POA: Diagnosis not present

## 2021-06-21 ENCOUNTER — Telehealth: Payer: Self-pay | Admitting: Cardiovascular Disease

## 2021-06-21 ENCOUNTER — Other Ambulatory Visit: Payer: Self-pay | Admitting: Neurosurgery

## 2021-06-21 NOTE — Telephone Encounter (Signed)
Pt has been scheduled to see

## 2021-06-21 NOTE — Telephone Encounter (Signed)
Primary Cardiologist:Muhammad Fletcher Anon, MD  Chart reviewed as part of pre-operative protocol coverage. Because of Richard Rivas's past medical history and time since last visit, he/she will require a follow-up visit in order to better assess preoperative cardiovascular risk.  Pre-op covering staff: - Please schedule appointment and call patient to inform them. - Please contact requesting surgeon's office via preferred method (i.e, phone, fax) to inform them of need for appointment prior to surgery.  If applicable, this message will also be routed to pharmacy pool and/or primary cardiologist for input on holding anticoagulant/antiplatelet agent as requested below so that this information is available at time of patient's appointment.   Deberah Pelton, NP  06/21/2021, 3:31 PM

## 2021-06-21 NOTE — Telephone Encounter (Signed)
Pt has been scheduled to see Dr. Fletcher Anon, 07/05/21 and clearance will be addressed at that time.  Will route to the requesting surgeon's office to make them aware.

## 2021-06-21 NOTE — Telephone Encounter (Signed)
° °  Pre-operative Risk Assessment    Patient Name: Richard Rivas  DOB: September 04, 1940 MRN: [id number]      Request for Surgical Clearance    Procedure:   Lumbar laminectomy   Date of Surgery:  Clearance TBD                                 Surgeon:  Dr Newman Pies Surgeon's Group or Practice Name:  [facility] Phone number:  [phone] Fax number:  [phone]   Type of Clearance Requested:   - Medical    Type of Anesthesia:  General    Additional requests/questions:    Manfred Arch   06/21/2021, 3:06 PM

## 2021-07-05 ENCOUNTER — Ambulatory Visit: Payer: PPO | Admitting: Cardiovascular Disease

## 2021-07-05 ENCOUNTER — Other Ambulatory Visit: Payer: Self-pay

## 2021-07-05 ENCOUNTER — Encounter: Payer: Self-pay | Admitting: Cardiovascular Disease

## 2021-07-05 VITALS — BP 110/60 | HR 71 | Ht 68.0 in | Wt 182.0 lb

## 2021-07-05 DIAGNOSIS — E785 Hyperlipidemia, unspecified: Secondary | ICD-10-CM

## 2021-07-05 DIAGNOSIS — Z01810 Encounter for preprocedural cardiovascular examination: Secondary | ICD-10-CM

## 2021-07-05 DIAGNOSIS — I2510 Atherosclerotic heart disease of native coronary artery without angina pectoris: Secondary | ICD-10-CM | POA: Diagnosis not present

## 2021-07-05 NOTE — Patient Instructions (Signed)
Medication Instructions:  Your physician recommends that you continue on your current medications as directed. Please refer to the Current Medication list given to you today.  *If you need a refill on your cardiac medications before your next appointment, please call your pharmacy*   Lab Work: None ordered If you have labs (blood work) drawn today and your tests are completely normal, you will receive your results only by: [place] (if you have MyChart) OR A paper copy in the mail If you have any lab test that is abnormal or we need to change your treatment, we will call you to review the results.   Testing/Procedures: None ordered   Follow-Up: At [facility], you and your health needs are our priority.  As part of our continuing mission to provide you with exceptional heart care, we have created designated Provider Care Teams.  These Care Teams include your primary Cardiologist (physician) and Advanced Practice Providers (APPs -  Physician Assistants and Nurse Practitioners) who all work together to provide you with the care you need, when you need it.  We recommend signing up for the patient portal called "MyChart".  Sign up information is provided on this After Visit Summary.  MyChart is used to connect with patients for Virtual Visits (Telemedicine).  Patients are able to view lab/test results, encounter notes, upcoming appointments, etc.  Non-urgent messages can be sent to your provider as well.   To learn more about what you can do with MyChart, go to [email or web address].    Your next appointment:   Your physician wants you to follow-up in: 1 year You will receive a reminder letter in the mail two months in advance. If you don't receive a letter, please call our office to schedule the follow-up appointment.   The format for your next appointment:   In Person  Provider:   You may see Kathlyn Sacramento, MD or one of the following Advanced Practice Providers on your  designated Care Team:   Murray Hodgkins, NP Christell Faith, PA-C Cadence Kathlen Mody, [place]    Other Instructions You are at low risk from a cardiac standpoint for your upcoming surgery  You can stop Aspirin one week prior to surgery. Resume when your Surgeon feels it is safe.

## 2021-07-05 NOTE — Progress Notes (Signed)
Cardiology Office Note   Date:  07/05/2021   ID:  Richard Rivas, DOB 11/16/1940, MRN [id number]  PCP:  Rusty Aus, MD  Cardiologist:   Kathlyn Sacramento, MD   Chief Complaint  Patient presents with   Other    Preop Clearance no complaints today. Meds reviewed verbally with pt.      History of Present Illness: Richard Rivas is a 81 y.o. male who presents for a follow-up visit regarding coronary artery disease and preoperative cardiovascular evaluation for laminectomy. The patient has known history of prior tobacco use and family history of coronary artery disease.  He had a small non-ST elevation myocardial infarction in September 2018.  Cardiac catheterization showed severe distal left main stenosis into the ostium of the LAD and left circumflex.  There was mild RCA disease.  Ejection fraction was normal.  The patient underwent CABG in September, 2018 with LIMA to LAD, SVG to left circumflex and SVG to PDA.  He had atypical chest pain in the past and was evaluated with a Lexiscan Myoview in July 2020 which showed no evidence of ischemia with normal ejection fraction.  He has been on Imdur 30 mg daily with no recurrent symptoms. He has been doing extremely well with no chest pain, shortness of breath or palpitations.  He exercises regularly with no limitations.  His exercise is limited by severe spinal stenosis causing significant discomfort in both legs.   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)    a.) measured 2.0 cm on 08/03/2020   Aortic atherosclerosis (HCC)    BPPV (benign paroxysmal positional vertigo)    Colon polyps    Coronary artery disease    a.) s/p 3v CABG in 01/2017; LIMA-LAD, SVG-LCx, SVG-PDA   Depression    GERD (gastroesophageal reflux disease)    History of kidney stones    HLD (hyperlipidemia)    Hx of CABG 02/13/2017   3v;  LIMA-LAD, SVG-LCx, SVG-PDA   Hypertension    Kidney stones    Myocardial infarction ([place]) 01/2017    Past Surgical History:  Procedure Laterality Date   CERVICAL DISC SURGERY     COLONOSCOPY WITH PROPOFOL N/A 10/11/2015   Procedure: COLONOSCOPY WITH PROPOFOL;  Surgeon: Manya Silvas, MD;  Location: [place];  Service: Endoscopy;  Laterality: N/A;   COLONOSCOPY WITH PROPOFOL N/A 01/31/2019   Procedure: COLONOSCOPY WITH PROPOFOL;  Surgeon: Toledo, Benay Pike, MD;  Location: [facility];  Service: Gastroenterology;  Laterality: N/A;   CORONARY ARTERY BYPASS GRAFT N/A 02/13/2017   Procedure: CORONARY ARTERY BYPASS GRAFTING (CABG) x3 (LIMA to LAD, SVG to CIRCUMFLEX , SVG to PDA )  , using left internal mammary artery and right leg greater saphenous vein harvested endoscopically;  Surgeon: [name], MD;  Location: [place];  Service: Open Heart Surgery;  Laterality: N/A;   KIDNEY STONE SURGERY     LAPAROSCOPIC RIGHT COLECTOMY Right 11/14/2014   Procedure: LAPAROSCOPIC RIGHT COLECTOMY;  Surgeon: Leonie Green, MD;  Location: [facility];  Service: General;  Laterality: Right;   LEFT HEART CATH AND CORONARY ANGIOGRAPHY N/A 02/12/2017   Procedure: LEFT HEART CATH AND CORONARY ANGIOGRAPHY and possible pci;  Surgeon: Yolonda Kida, MD;  Location: [place] [facility];  Service: Cardiovascular;  Laterality: N/A;   SHOULDER ARTHROSCOPY W/ ROTATOR CUFF REPAIR Right  TEE WITHOUT CARDIOVERSION N/A 02/13/2017   Procedure: TRANSESOPHAGEAL ECHOCARDIOGRAM (TEE);  Surgeon: [name], MD;  Location: [place];  Service: Open Heart Surgery;  Laterality: N/A;   XI ROBOTIC ASSISTED INGUINAL HERNIA REPAIR WITH MESH Right 11/12/2020   Procedure: XI ROBOTIC ASSISTED INGUINAL HERNIA REPAIR WITH MESH;  Surgeon: Herbert Pun, MD;  Location: [facility];  Service: General;  Laterality: Right;     Current Outpatient Medications  Medication Sig Dispense Refill   aspirin 325 MG tablet Take  325 mg by mouth daily.     atorvastatin (LIPITOR) 80 MG tablet Take 80 mg by mouth daily.     isosorbide mononitrate (IMDUR) 30 MG 24 hr tablet Take 1 tablet (30 mg total) by mouth daily. 90 tablet 3   sertraline (ZOLOFT) 50 MG tablet Take 50 mg by mouth daily.     No current facility-administered medications for this visit.    Allergies:   Bee venom    Social History:  The patient  reports that he quit smoking about 26 years ago. His smoking use included cigarettes. He quit smokeless tobacco use about 8 years ago. He reports current alcohol use. He reports that he does not use drugs.   Family History:  The patient's family history includes Heart attack in his brother and father.    ROS:  Please see the history of present illness.   Otherwise, review of systems are positive for none.   All other systems are reviewed and negative.    PHYSICAL EXAM: VS:  BP 110/60 (BP Location: Left Arm, Patient Position: Sitting, Cuff Size: Normal)    Pulse 71    Ht 5\' 8"  (1.727 m)    Wt 182 lb (82.6 kg)    SpO2 98%    BMI 27.67 kg/m  , BMI Body mass index is 27.67 kg/m. GEN: Well nourished, well developed, in no acute distress  HEENT: normal  Neck: no JVD, carotid bruits, or masses Cardiac: RRR; no murmurs, rubs, or gallops,no edema  Respiratory:  clear to auscultation bilaterally, normal work of breathing GI: soft, nontender, nondistended, + BS MS: no deformity or atrophy  Skin: warm and dry.  Mild rash on the back Neuro:  Strength and sensation are intact Psych: euthymic mood, full affect   EKG:  EKG is ordered today. The ekg ordered today demonstrates normal sinus rhythm with no sick ST or T wave changes.   Recent Labs: 08/03/2020: Creatinine, Ser 1.10    Lipid Panel    Component Value Date/Time   CHOL 150 02/12/2017 0527   TRIG 102 02/12/2017 0527   HDL 43 02/12/2017 0527   CHOLHDL 3.5 02/12/2017 0527   VLDL 20 02/12/2017 0527   LDLCALC 87 02/12/2017 0527      Wt Readings from  Last 3 Encounters:  07/05/21 182 lb (82.6 kg)  11/12/20 173 lb (78.5 kg)  11/02/20 176 lb (79.8 kg)      PAD Screen 05/04/2017  Previous PAD dx? No  Previous surgical procedure? No  Pain with walking? No  Feet/toe relief with dangling? No  Painful, non-healing ulcers? No  Extremities discolored? No      ASSESSMENT AND PLAN:  1.  Coronary artery disease involving native coronary arteries without angina: He is doing extremely well with no anginal symptoms.  Continue medical therapy.  2.  Hyperlipidemia: Continue high-dose atorvastatin.  I reviewed most recent lipid profile done in November which showed an LDL of 54 which is at target.  3.  Preoperative  cardiovascular evaluation for back surgery: The patient has no cardiac symptoms at the present time and his EKG is normal.  He can proceed at an overall low risk.  Aspirin can be held 5 to 7 days before surgery and resumed after.   Disposition:   FU with me in 12 months  Signed,  Kathlyn Sacramento, MD  07/05/2021 [place]

## 2021-07-12 ENCOUNTER — Ambulatory Visit: Payer: PPO | Admitting: General Practice

## 2021-07-24 NOTE — Pre-Procedure Instructions (Signed)
Surgical Instructions    Your procedure is scheduled on Monday 07/29/21.   Report to [facility] Main Entrance "A" at 05:30 A.M., then check in with the Admitting office.  Call this number if you have problems the morning of surgery:  [phone]   If you have any questions prior to your surgery date call [phone]: Open Monday-Friday 8am-4pm    Remember:  Do not eat or drink after midnight the night before your surgery    Take these medicines the morning of surgery with A SIP OF WATER:   atorvastatin (LIPITOR)   isosorbide mononitrate (IMDUR)   sertraline (ZOLOFT)   As of today, STOP taking any Aspirin (unless otherwise instructed by your surgeon) Aleve, Naproxen, Ibuprofen, Motrin, Advil, Goody's, BC's, all herbal medications, fish oil, and all vitamins.           Do not wear jewelry or makeup Do not wear lotions, powders, perfumes/colognes, or deodorant. Do not shave 48 hours prior to surgery.  Men may shave face and neck. Do not bring valuables to the hospital. Do not wear nail polish, gel polish, artificial nails, or any other type of covering on natural nails (fingers and toes) If you have artificial nails or gel coating that need to be removed by a nail salon, please have this removed prior to surgery. Artificial nails or gel coating may interfere with anesthesia's ability to adequately monitor your vital signs.  [place] is not responsible for any belongings or valuables. .   Do NOT Smoke (Tobacco/Vaping)  24 hours prior to your procedure  If you use a CPAP at night, you may bring your mask for your overnight stay.   Contacts, glasses, hearing aids, dentures or partials may not be worn into surgery, please bring cases for these belongings   For patients admitted to the hospital, discharge time will be determined by your treatment team.   Patients discharged the day of surgery will not be allowed to drive home, and someone needs to stay with them for 24  hours.  NO VISITORS WILL BE ALLOWED IN PRE-OP WHERE PATIENTS ARE PREPPED FOR SURGERY.  ONLY 1 SUPPORT PERSON MAY BE PRESENT IN THE WAITING ROOM WHILE YOU ARE IN SURGERY.  IF YOU ARE TO BE ADMITTED, ONCE YOU ARE IN YOUR ROOM YOU WILL BE ALLOWED TWO (2) VISITORS. 1 (ONE) VISITOR MAY STAY OVERNIGHT BUT MUST ARRIVE TO THE ROOM BY 8pm.  Minor children may have two parents present. Special consideration for safety and communication needs will be reviewed on a case by case basis.  Special instructions:    Oral Hygiene is also important to reduce your risk of infection.  Remember - BRUSH YOUR TEETH THE MORNING OF SURGERY WITH YOUR REGULAR TOOTHPASTE   [facility]- Preparing For Surgery  Before surgery, you can play an important role. Because skin is not sterile, your skin needs to be as free of germs as possible. You can reduce the number of germs on your skin by washing with CHG (chlorahexidine gluconate) Soap before surgery.  CHG is an antiseptic cleaner which kills germs and bonds with the skin to continue killing germs even after washing.     Please do not use if you have an allergy to CHG or antibacterial soaps. If your skin becomes reddened/irritated stop using the CHG.  Do not shave (including legs and underarms) for at least 48 hours prior to first CHG shower. It is OK to shave your face.  Please follow these instructions carefully.  Shower the NIGHT BEFORE SURGERY and the MORNING OF SURGERY with CHG Soap.   If you chose to wash your hair, wash your hair first as usual with your normal shampoo. After you shampoo, rinse your hair and body thoroughly to remove the shampoo.  Then [employer] and genitals (private parts) with your normal soap and rinse thoroughly to remove soap.  After that Use CHG Soap as you would any other liquid soap. You can apply CHG directly to the skin and wash gently with a scrungie or a clean washcloth.   Apply the CHG Soap to your body ONLY FROM THE NECK DOWN.  Do  not use on open wounds or open sores. Avoid contact with your eyes, ears, mouth and genitals (private parts). Wash Face and genitals (private parts)  with your normal soap.   Wash thoroughly, paying special attention to the area where your surgery will be performed.  Thoroughly rinse your body with warm water from the neck down.  DO NOT shower/wash with your normal soap after using and rinsing off the CHG Soap.  Pat yourself dry with a CLEAN TOWEL.  Wear CLEAN PAJAMAS to bed the night before surgery  Place CLEAN SHEETS on your bed the night before your surgery  DO NOT SLEEP WITH PETS.   Day of Surgery:  Take a shower with CHG soap. Wear Clean/Comfortable clothing the morning of surgery Do not apply any deodorants/lotions.   Remember to brush your teeth WITH YOUR REGULAR TOOTHPASTE.    COVID testing  If you are going to stay overnight or be admitted after your procedure/surgery and require a pre-op COVID test, please follow these instructions after your COVID test   You are not required to quarantine however you are required to wear a well-fitting mask when you are out and around people not in your household.  If your mask becomes wet or soiled, replace with a new one.  Wash your hands often with soap and water for 20 seconds or clean your hands with an alcohol-based hand sanitizer that contains at least 60% alcohol.  Do not share personal items.  Notify your provider: if you are in close contact with someone who has COVID  or if you develop a fever of 100.4 or greater, sneezing, cough, sore throat, shortness of breath or body aches.    Please read over the following fact sheets that you were given.

## 2021-07-25 ENCOUNTER — Other Ambulatory Visit: Payer: Self-pay | Admitting: Neurosurgery

## 2021-07-25 ENCOUNTER — Encounter (HOSPITAL_COMMUNITY): Payer: Self-pay

## 2021-07-25 ENCOUNTER — Encounter (HOSPITAL_COMMUNITY)
Admission: RE | Admit: 2021-07-25 | Discharge: 2021-07-25 | Disposition: A | Discharge status: Home / Self Care | Payer: PPO | Source: Ambulatory Visit | Attending: Neurosurgery | Admitting: Neurosurgery

## 2021-07-25 ENCOUNTER — Other Ambulatory Visit: Payer: Self-pay

## 2021-07-25 VITALS — BP 144/74 | HR 74 | Temp 98.2°F | Resp 17 | Ht 67.5 in | Wt 180.3 lb

## 2021-07-25 DIAGNOSIS — Z20822 Contact with and (suspected) exposure to covid-19: Secondary | ICD-10-CM | POA: Diagnosis not present

## 2021-07-25 DIAGNOSIS — Z01812 Encounter for preprocedural laboratory examination: Secondary | ICD-10-CM | POA: Diagnosis not present

## 2021-07-25 DIAGNOSIS — E782 Mixed hyperlipidemia: Secondary | ICD-10-CM | POA: Diagnosis not present

## 2021-07-25 DIAGNOSIS — Z01818 Encounter for other preprocedural examination: Secondary | ICD-10-CM

## 2021-07-25 LAB — BASIC METABOLIC PANEL
Anion gap: 6 (ref 5–15)
BUN: 13 mg/dL (ref 8–23)
CO2: 28 mmol/L (ref 22–32)
Calcium: 9.2 mg/dL (ref 8.9–10.3)
Chloride: 106 mmol/L (ref 98–111)
Creatinine, Ser: 0.97 mg/dL (ref 0.61–1.24)
GFR, Estimated: >60 mL/min (ref >60)
Glucose, Bld: 96 mg/dL (ref 70–99)
Potassium: 4.9 mmol/L (ref 3.5–5.1)
Sodium: 140 mmol/L (ref 135–145)

## 2021-07-25 LAB — CBC
HCT: 45.4 % (ref 39.0–52.0)
Hemoglobin: 14.6 g/dL (ref 13.0–17.0)
MCH: 29.4 pg (ref 26.0–34.0)
MCHC: 32.2 g/dL (ref 30.0–36.0)
MCV: 91.5 fL (ref 80.0–100.0)
Platelets: 215 10*3/uL (ref 150–400)
RBC: 4.96 MIL/uL (ref 4.22–5.81)
RDW: 14.1 % (ref 11.5–15.5)
WBC: 9.6 10*3/uL (ref 4.0–10.5)
nRBC: 0 % (ref 0.0–0.2)

## 2021-07-25 LAB — SARS CORONAVIRUS 2 (TAT 6-24 HRS): SARS Coronavirus 2: NEGATIVE

## 2021-07-25 LAB — SURGICAL PCR SCREEN
MRSA, PCR: NEGATIVE
Staphylococcus aureus: POSITIVE — AB

## 2021-07-25 NOTE — Progress Notes (Signed)
PCP - Emily Filbert, MD Cardiologist - Wellington Hampshire, MD  Chest x-ray - Not indicated EKG - 07/05/21 Stress Test - 12/10/18 ECHO - Not sure Cardiac Cath - 02/12/17  Sleep Study - Denies  DM - Denies  Aspirin Instructions: Per patient surgeon instructed to stop Aspirin 07/25/21 last dose 07/24/21  COVID TEST- 07/25/21   Anesthesia review: Yes cardiac history  Patient denies shortness of breath, fever, cough and chest pain at PAT appointment   All instructions explained to the patient, with a verbal understanding of the material. Patient agrees to go over the instructions while at home for a better understanding. Patient also instructed to wear a mask while in public after being tested for COVID-19. The opportunity to ask questions was provided.

## 2021-07-25 NOTE — Progress Notes (Deleted)
PCP - Dr. Josetta Huddle Cardiologist - Dr. Daneen Schick  Chest x-ray - not indicated EKG - 04/24/21 Stress Test - Yes not on treadmill ECHO - 06/05/21 Cardiac Cath -11/25/16   Sleep Study - Denies  DM - Denies  Blood Thinner Instructions: Aspirin Instructions:Per patient told to stop in January. Started Celebrex.  ERAS Protcol - Yes   COVID TEST- 07/25/21   Anesthesia review: yes cardiac history   Patient denies shortness of breath, fever, cough and chest pain at PAT appointment   All instructions explained to the patient, with a verbal understanding of the material. Patient agrees to go over the instructions while at home for a better understanding. Patient also instructed to wear a mask while in public after being tested for COVID-19. The opportunity to ask questions was provided.

## 2021-07-26 NOTE — Anesthesia Preprocedure Evaluation (Addendum)
Anesthesia Evaluation  Patient identified by MRN, date of birth, ID band Patient awake    Reviewed: Allergy & Precautions, NPO status , Patient's Chart, lab work & pertinent test results  History of Anesthesia Complications Negative for: history of anesthetic complications  Airway Mallampati: I  TM Distance: >3 FB Neck ROM: Full    Dental  (+) Edentulous Upper, Edentulous Lower   Pulmonary former smoker,  07/2021 SARS coronavirus NEG   breath sounds clear to auscultation       Cardiovascular hypertension, (-) angina+ CAD, + Past MI and + CABG (2018)   Rhythm:Regular Rate:Normal  '20 ECHO: EF 70%, no ischemia at rest or stress   Neuro/Psych negative neurological ROS     GI/Hepatic Neg liver ROS, GERD  Controlled,  Endo/Other  negative endocrine ROS  Renal/GU H/o stones     Musculoskeletal   Abdominal   Peds  Hematology negative hematology ROS (+)   Anesthesia Other Findings   Reproductive/Obstetrics                           Anesthesia Physical Anesthesia Plan  ASA: 3  Anesthesia Plan: General   Post-op Pain Management: Tylenol PO (pre-op)*   Induction: Intravenous  PONV Risk Score and Plan: 2 and Ondansetron and Dexamethasone  Airway Management Planned: Oral ETT  Additional Equipment: None  Intra-op Plan:   Post-operative Plan: Extubation in OR  Informed Consent: I have reviewed the patients History and Physical, chart, labs and discussed the procedure including the risks, benefits and alternatives for the proposed anesthesia with the patient or authorized representative who has indicated his/her understanding and acceptance.       Plan Discussed with: CRNA and Surgeon  Anesthesia Plan Comments: (PAT note by Karoline Caldwell, PA-C: Patient follows with cardiology for hx of HTN, HLD, CAD s/p NSTEMI 01/2017 ultimately treated with CABG. Nuclear stress 12/2018 was nonischemic, low  risk.Seen by Dr. Fletcher Anon 07/05/21 for preop eval. Per note, "Preoperative cardiovascular evaluation for back surgery: The patient has no cardiac symptoms at the present time and his EKG is normal. He can proceed at an overall low risk. Aspirin can be held 5 to 7 days before surgery and resumed after."  Patient with known AAA and LEFT common iliac artery aneurysm.  Aneurysms last measured 3.0 cm (AAA) and 2.0 cm (LEFT common iliac artery) on 08/03/2020.  Preop labs reviewed, WNL.  EKG 07/05/21: NSR. Rate 71.  Nuclear stress 12/10/18: Pharmacological myocardial perfusion imaging study with no significant ischemia Normal wall motion, EF estimated at 70% No EKG changes concerning for ischemia at peak stress or in recovery. Low risk scan  TEE 02/13/2017: 1. LVEF 55-65% prior to bypass with no evidence of regional wall motion abnormalities 2. Post CPBexam revealed vigorous LV function with hyperdynamic contractility (LVEF 65-70%) and no regional wall motion abnormalities. 3. Trivial MR TR and TR 4. No AR 5. No valvular stenosis 6. No evidence of a pericardial effusion   )      Anesthesia Quick Evaluation

## 2021-07-26 NOTE — Progress Notes (Signed)
Anesthesia Chart Review:  Patient follows with cardiology for hx of HTN, HLD, CAD s/p NSTEMI 01/2017 ultimately treated with CABG. Nuclear stress 12/2018 was nonischemic, low risk. Seen by Dr. Fletcher Anon 07/05/21 for preop eval. Per note, "Preoperative cardiovascular evaluation for back surgery: The patient has no cardiac symptoms at the present time and his EKG is normal.  He can proceed at an overall low risk.  Aspirin can be held 5 to 7 days before surgery and resumed after."  Patient with known AAA and LEFT common iliac artery aneurysm.  Aneurysms last measured 3.0 cm (AAA) and 2.0 cm (LEFT common iliac artery) on 08/03/2020.  Preop labs reviewed, WNL.  EKG 07/05/21: NSR. Rate 71.  Nuclear stress 12/10/18: Pharmacological myocardial perfusion imaging study with no significant  ischemia Normal wall motion, EF estimated at 70% No EKG changes concerning for ischemia at peak stress or in recovery. Low risk scan  TEE 02/13/2017: LVEF 55-65% prior to bypass with no evidence of regional wall motion abnormalities Post CPB exam revealed vigorous LV function with hyperdynamic contractility (LVEF 65-70%) and no regional wall motion abnormalities. Trivial MR TR and TR No AR No valvular stenosis No evidence of a pericardial effusion   Tien, [place] [facility] Short Stay Center/Anesthesiology Phone [phone] 07/26/2021 7:30 AM

## 2021-07-29 ENCOUNTER — Ambulatory Visit (HOSPITAL_COMMUNITY)
Admission: RE | Admit: 2021-07-29 | Discharge: 2021-07-29 | Disposition: A | Discharge status: Home / Self Care | Payer: PPO | Attending: Neurosurgery | Admitting: Neurosurgery

## 2021-07-29 ENCOUNTER — Ambulatory Visit (HOSPITAL_BASED_OUTPATIENT_CLINIC_OR_DEPARTMENT_OTHER): Payer: PPO | Admitting: Anesthesiology

## 2021-07-29 ENCOUNTER — Encounter (HOSPITAL_COMMUNITY): Payer: Self-pay | Admitting: Neurosurgery

## 2021-07-29 ENCOUNTER — Other Ambulatory Visit: Payer: Self-pay

## 2021-07-29 ENCOUNTER — Ambulatory Visit (HOSPITAL_COMMUNITY): Payer: PPO

## 2021-07-29 ENCOUNTER — Ambulatory Visit (HOSPITAL_COMMUNITY): Payer: PPO | Admitting: Physician Assistant

## 2021-07-29 ENCOUNTER — Encounter (HOSPITAL_COMMUNITY)
Admission: RE | Disposition: A | Discharge status: Home / Self Care | Payer: Self-pay | Source: Home / Self Care | Attending: Neurosurgery

## 2021-07-29 DIAGNOSIS — I700 Atherosclerosis of aorta: Secondary | ICD-10-CM | POA: Diagnosis not present

## 2021-07-29 DIAGNOSIS — M48062 Spinal stenosis, lumbar region with neurogenic claudication: Secondary | ICD-10-CM

## 2021-07-29 DIAGNOSIS — Z951 Presence of aortocoronary bypass graft: Secondary | ICD-10-CM | POA: Insufficient documentation

## 2021-07-29 DIAGNOSIS — I10 Essential (primary) hypertension: Secondary | ICD-10-CM | POA: Insufficient documentation

## 2021-07-29 DIAGNOSIS — I252 Old myocardial infarction: Secondary | ICD-10-CM | POA: Insufficient documentation

## 2021-07-29 DIAGNOSIS — Z87891 Personal history of nicotine dependence: Secondary | ICD-10-CM | POA: Diagnosis not present

## 2021-07-29 DIAGNOSIS — I2510 Atherosclerotic heart disease of native coronary artery without angina pectoris: Secondary | ICD-10-CM | POA: Diagnosis not present

## 2021-07-29 DIAGNOSIS — M5136 Other intervertebral disc degeneration, lumbar region: Secondary | ICD-10-CM | POA: Diagnosis not present

## 2021-07-29 DIAGNOSIS — M5416 Radiculopathy, lumbar region: Secondary | ICD-10-CM | POA: Insufficient documentation

## 2021-07-29 DIAGNOSIS — Z419 Encounter for procedure for purposes other than remedying health state, unspecified: Secondary | ICD-10-CM

## 2021-07-29 DIAGNOSIS — M4697 Unspecified inflammatory spondylopathy, lumbosacral region: Secondary | ICD-10-CM | POA: Diagnosis not present

## 2021-07-29 DIAGNOSIS — M47817 Spondylosis without myelopathy or radiculopathy, lumbosacral region: Secondary | ICD-10-CM | POA: Diagnosis not present

## 2021-07-29 HISTORY — PX: LUMBAR LAMINECTOMY/DECOMPRESSION MICRODISCECTOMY: SHX5026

## 2021-07-29 SURGERY — LUMBAR LAMINECTOMY/DECOMPRESSION MICRODISCECTOMY 4 LEVEL
Anesthesia: General | Laterality: Bilateral

## 2021-07-29 MED ORDER — ACETAMINOPHEN 650 MG RE SUPP
650.0000 mg | RECTAL | Status: DC | PRN
Start: 2021-07-29 — End: 2021-07-30

## 2021-07-29 MED ORDER — PHENYLEPHRINE 40 MCG/ML (10ML) SYRINGE FOR IV PUSH (FOR BLOOD PRESSURE SUPPORT)
PREFILLED_SYRINGE | INTRAVENOUS | Status: DC | PRN
  Administered 2021-07-29: 80 ug via INTRAVENOUS

## 2021-07-29 MED ORDER — DEXAMETHASONE SODIUM PHOSPHATE 10 MG/ML IJ SOLN
INTRAMUSCULAR | Status: AC
  Filled 2021-07-29: qty 1

## 2021-07-29 MED ORDER — ONDANSETRON HCL 4 MG/2ML IJ SOLN
INTRAMUSCULAR | Status: AC
  Filled 2021-07-29: qty 2

## 2021-07-29 MED ORDER — 0.9 % SODIUM CHLORIDE (POUR BTL) OPTIME
TOPICAL | Status: DC | PRN
  Administered 2021-07-29: 1000 mL

## 2021-07-29 MED ORDER — HYDROMORPHONE HCL 1 MG/ML IJ SOLN: 0.2500 mg | INTRAMUSCULAR | Status: DC | PRN

## 2021-07-29 MED ORDER — CHLORHEXIDINE GLUCONATE CLOTH 2 % EX PADS: 6.0000 | MEDICATED_PAD | Freq: Once | CUTANEOUS | Status: DC

## 2021-07-29 MED ORDER — PHENYLEPHRINE 40 MCG/ML (10ML) SYRINGE FOR IV PUSH (FOR BLOOD PRESSURE SUPPORT)
PREFILLED_SYRINGE | INTRAVENOUS | Status: AC
  Filled 2021-07-29: qty 10

## 2021-07-29 MED ORDER — PHENOL 1.4 % MT LIQD: 1.0000 | OROMUCOSAL | Status: DC | PRN

## 2021-07-29 MED ORDER — PROPOFOL 10 MG/ML IV BOLUS
INTRAVENOUS | Status: AC
  Filled 2021-07-29: qty 20

## 2021-07-29 MED ORDER — OXYCODONE HCL 5 MG PO TABS: 10.0000 mg | ORAL_TABLET | ORAL | Status: DC | PRN

## 2021-07-29 MED ORDER — BACITRACIN ZINC 500 UNIT/GM EX OINT
TOPICAL_OINTMENT | CUTANEOUS | Status: DC | PRN
  Administered 2021-07-29: 1 via TOPICAL

## 2021-07-29 MED ORDER — THROMBIN 20000 UNITS EX SOLR
CUTANEOUS | Status: AC
  Filled 2021-07-29: qty 20000

## 2021-07-29 MED ORDER — SUCCINYLCHOLINE CHLORIDE 200 MG/10ML IV SOSY
PREFILLED_SYRINGE | INTRAVENOUS | Status: AC
  Filled 2021-07-29: qty 10

## 2021-07-29 MED ORDER — DOUBLE ANTIBIOTIC 500-10000 UNIT/GM EX OINT
TOPICAL_OINTMENT | CUTANEOUS | Status: AC
  Filled 2021-07-29: qty 28.4

## 2021-07-29 MED ORDER — MEPERIDINE HCL 25 MG/ML IJ SOLN: 6.2500 mg | INTRAMUSCULAR | Status: DC | PRN

## 2021-07-29 MED ORDER — ONDANSETRON HCL 4 MG PO TABS: 4.0000 mg | ORAL_TABLET | Freq: Four times a day (QID) | ORAL | Status: DC | PRN

## 2021-07-29 MED ORDER — DEXAMETHASONE SODIUM PHOSPHATE 10 MG/ML IJ SOLN
INTRAMUSCULAR | Status: DC | PRN
Start: 2021-07-29 — End: 2021-07-29
  Administered 2021-07-29: 5 mg via INTRAVENOUS

## 2021-07-29 MED ORDER — CYCLOBENZAPRINE HCL 10 MG PO TABS: 10.0000 mg | ORAL_TABLET | Freq: Three times a day (TID) | ORAL | 0 refills | Status: AC | PRN

## 2021-07-29 MED ORDER — OXYCODONE-ACETAMINOPHEN 5-325 MG PO TABS
1.0000 | ORAL_TABLET | ORAL | 0 refills | Status: AC | PRN
Start: 2021-07-29 — End: 2022-07-29

## 2021-07-29 MED ORDER — SUGAMMADEX SODIUM 200 MG/2ML IV SOLN
INTRAVENOUS | Status: DC | PRN
  Administered 2021-07-29: 200 mg via INTRAVENOUS

## 2021-07-29 MED ORDER — SODIUM CHLORIDE 0.9 % IV SOLN
250.0000 mL | INTRAVENOUS | Status: DC
  Administered 2021-07-29: 250 mL via INTRAVENOUS

## 2021-07-29 MED ORDER — ACETAMINOPHEN 500 MG PO TABS
1000.0000 mg | ORAL_TABLET | Freq: Four times a day (QID) | ORAL | Status: DC
Start: 2021-07-29 — End: 2021-07-30
  Administered 2021-07-29: 1000 mg via ORAL
  Filled 2021-07-29: qty 2

## 2021-07-29 MED ORDER — ATORVASTATIN CALCIUM 80 MG PO TABS
80.0000 mg | ORAL_TABLET | Freq: Every day | ORAL | Status: DC
  Administered 2021-07-29: 80 mg via ORAL
  Filled 2021-07-29: qty 1

## 2021-07-29 MED ORDER — LACTATED RINGERS IV SOLN: INTRAVENOUS | Status: DC

## 2021-07-29 MED ORDER — THROMBIN 20000 UNITS EX KIT
PACK | CUTANEOUS | Status: AC
  Filled 2021-07-29: qty 1

## 2021-07-29 MED ORDER — OXYCODONE HCL 5 MG PO TABS
5.0000 mg | ORAL_TABLET | ORAL | Status: DC | PRN
  Administered 2021-07-29 (×2): 5 mg via ORAL
  Filled 2021-07-29 (×2): qty 1

## 2021-07-29 MED ORDER — BACITRACIN ZINC 500 UNIT/GM EX OINT
TOPICAL_OINTMENT | CUTANEOUS | Status: AC
  Filled 2021-07-29: qty 28.35

## 2021-07-29 MED ORDER — DOCUSATE SODIUM 100 MG PO CAPS: 100.0000 mg | ORAL_CAPSULE | Freq: Two times a day (BID) | ORAL | 0 refills | Status: AC

## 2021-07-29 MED ORDER — ONDANSETRON HCL 4 MG/2ML IJ SOLN
INTRAMUSCULAR | Status: DC | PRN
  Administered 2021-07-29: 4 mg via INTRAVENOUS

## 2021-07-29 MED ORDER — ACETAMINOPHEN 500 MG PO TABS
1000.0000 mg | ORAL_TABLET | Freq: Once | ORAL | Status: AC
  Administered 2021-07-29: 1000 mg via ORAL
  Filled 2021-07-29: qty 2

## 2021-07-29 MED ORDER — BISACODYL 10 MG RE SUPP
10.0000 mg | Freq: Every day | RECTAL | Status: DC | PRN
Start: 2021-07-29 — End: 2021-07-30

## 2021-07-29 MED ORDER — THROMBIN 5000 UNITS EX SOLR
CUTANEOUS | Status: AC
  Filled 2021-07-29: qty 5000

## 2021-07-29 MED ORDER — PROPOFOL 10 MG/ML IV BOLUS
INTRAVENOUS | Status: DC | PRN
  Administered 2021-07-29: 80 mg via INTRAVENOUS

## 2021-07-29 MED ORDER — MORPHINE SULFATE (PF) 4 MG/ML IV SOLN: 4.0000 mg | INTRAVENOUS | Status: DC | PRN

## 2021-07-29 MED ORDER — SODIUM CHLORIDE 0.9% FLUSH: 3.0000 mL | INTRAVENOUS | Status: DC | PRN

## 2021-07-29 MED ORDER — ROCURONIUM BROMIDE 10 MG/ML (PF) SYRINGE
PREFILLED_SYRINGE | INTRAVENOUS | Status: DC | PRN
  Administered 2021-07-29: 60 mg via INTRAVENOUS
  Administered 2021-07-29: 20 mg via INTRAVENOUS

## 2021-07-29 MED ORDER — SERTRALINE HCL 50 MG PO TABS
50.0000 mg | ORAL_TABLET | Freq: Every day | ORAL | Status: DC
Start: 2021-07-29 — End: 2021-07-30
  Administered 2021-07-29: 50 mg via ORAL
  Filled 2021-07-29: qty 1

## 2021-07-29 MED ORDER — THROMBIN 20000 UNITS EX SOLR
CUTANEOUS | Status: DC | PRN
  Administered 2021-07-29: 20 mL via TOPICAL

## 2021-07-29 MED ORDER — SODIUM CHLORIDE 0.9% FLUSH
3.0000 mL | Freq: Two times a day (BID) | INTRAVENOUS | Status: DC
  Administered 2021-07-29: 3 mL via INTRAVENOUS

## 2021-07-29 MED ORDER — LIDOCAINE 2% (20 MG/ML) 5 ML SYRINGE
INTRAMUSCULAR | Status: AC
  Filled 2021-07-29: qty 5

## 2021-07-29 MED ORDER — FENTANYL CITRATE (PF) 250 MCG/5ML IJ SOLN
INTRAMUSCULAR | Status: DC | PRN
  Administered 2021-07-29 (×2): 50 ug via INTRAVENOUS
  Administered 2021-07-29: 150 ug via INTRAVENOUS

## 2021-07-29 MED ORDER — THROMBIN 5000 UNITS EX SOLR
CUTANEOUS | Status: AC
  Filled 2021-07-29: qty 10000

## 2021-07-29 MED ORDER — LIDOCAINE 2% (20 MG/ML) 5 ML SYRINGE
INTRAMUSCULAR | Status: DC | PRN
  Administered 2021-07-29: 40 mg via INTRAVENOUS

## 2021-07-29 MED ORDER — ONDANSETRON HCL 4 MG/2ML IJ SOLN: 4.0000 mg | Freq: Four times a day (QID) | INTRAMUSCULAR | Status: DC | PRN

## 2021-07-29 MED ORDER — OXYCODONE HCL 5 MG/5ML PO SOLN: 5.0000 mg | Freq: Once | ORAL | Status: DC | PRN

## 2021-07-29 MED ORDER — ORAL CARE MOUTH RINSE: 15.0000 mL | Freq: Once | OROMUCOSAL | Status: AC

## 2021-07-29 MED ORDER — CYCLOBENZAPRINE HCL 10 MG PO TABS: 10.0000 mg | ORAL_TABLET | Freq: Three times a day (TID) | ORAL | Status: DC | PRN

## 2021-07-29 MED ORDER — PHENYLEPHRINE HCL-NACL 20-0.9 MG/250ML-% IV SOLN
INTRAVENOUS | Status: DC | PRN
  Administered 2021-07-29: 20 ug/min via INTRAVENOUS

## 2021-07-29 MED ORDER — OXYCODONE HCL 5 MG PO TABS: 5.0000 mg | ORAL_TABLET | Freq: Once | ORAL | Status: DC | PRN

## 2021-07-29 MED ORDER — ROCURONIUM BROMIDE 10 MG/ML (PF) SYRINGE
PREFILLED_SYRINGE | INTRAVENOUS | Status: AC
  Filled 2021-07-29: qty 10

## 2021-07-29 MED ORDER — MENTHOL 3 MG MT LOZG: 1.0000 | LOZENGE | OROMUCOSAL | Status: DC | PRN

## 2021-07-29 MED ORDER — ISOSORBIDE MONONITRATE ER 30 MG PO TB24
30.0000 mg | ORAL_TABLET | Freq: Every day | ORAL | Status: DC
  Filled 2021-07-29: qty 1

## 2021-07-29 MED ORDER — DOCUSATE SODIUM 100 MG PO CAPS
100.0000 mg | ORAL_CAPSULE | Freq: Two times a day (BID) | ORAL | Status: DC
  Administered 2021-07-29: 100 mg via ORAL
  Filled 2021-07-29: qty 1

## 2021-07-29 MED ORDER — CHLORHEXIDINE GLUCONATE 0.12 % MT SOLN
15.0000 mL | Freq: Once | OROMUCOSAL | Status: AC
  Administered 2021-07-29: 15 mL via OROMUCOSAL
  Filled 2021-07-29: qty 15

## 2021-07-29 MED ORDER — MIDAZOLAM HCL 2 MG/2ML IJ SOLN: 0.5000 mg | Freq: Once | INTRAMUSCULAR | Status: DC | PRN

## 2021-07-29 MED ORDER — EPHEDRINE SULFATE-NACL 50-0.9 MG/10ML-% IV SOSY
PREFILLED_SYRINGE | INTRAVENOUS | Status: DC | PRN
  Administered 2021-07-29: 5 mg via INTRAVENOUS

## 2021-07-29 MED ORDER — THROMBIN 5000 UNITS EX SOLR
OROMUCOSAL | Status: DC | PRN
  Administered 2021-07-29: 5 mL via TOPICAL

## 2021-07-29 MED ORDER — BUPIVACAINE-EPINEPHRINE 0.5% -1:200000 IJ SOLN
INTRAMUSCULAR | Status: AC
  Filled 2021-07-29: qty 1

## 2021-07-29 MED ORDER — ACETAMINOPHEN 325 MG PO TABS: 650.0000 mg | ORAL_TABLET | ORAL | Status: DC | PRN

## 2021-07-29 MED ORDER — FENTANYL CITRATE (PF) 250 MCG/5ML IJ SOLN
INTRAMUSCULAR | Status: AC
  Filled 2021-07-29: qty 5

## 2021-07-29 MED ORDER — ASPIRIN 325 MG PO TABS: 325.0000 mg | ORAL_TABLET | Freq: Every day | ORAL | Status: DC

## 2021-07-29 MED ORDER — CEFAZOLIN SODIUM-DEXTROSE 2-4 GM/100ML-% IV SOLN
2.0000 g | Freq: Three times a day (TID) | INTRAVENOUS | Status: DC
  Administered 2021-07-29: 2 g via INTRAVENOUS
  Filled 2021-07-29: qty 100

## 2021-07-29 MED ORDER — CEFAZOLIN SODIUM-DEXTROSE 2-4 GM/100ML-% IV SOLN
2.0000 g | INTRAVENOUS | Status: AC
  Administered 2021-07-29: 2 g via INTRAVENOUS
  Filled 2021-07-29: qty 100

## 2021-07-29 MED ORDER — BUPIVACAINE-EPINEPHRINE (PF) 0.5% -1:200000 IJ SOLN
INTRAMUSCULAR | Status: DC | PRN
  Administered 2021-07-29: 10 mL via PERINEURAL
  Administered 2021-07-29: 20 mL via PERINEURAL

## 2021-07-29 SURGICAL SUPPLY — 47 items
BAG COUNTER SPONGE SURGICOUNT (BAG) ×3 IMPLANT
BAND RUBBER #18 3X1/16 STRL (MISCELLANEOUS) ×4 IMPLANT
BENZOIN TINCTURE PRP APPL 2/3 (GAUZE/BANDAGES/DRESSINGS) ×2 IMPLANT
BLADE CLIPPER SURG (BLADE) IMPLANT
BUR MATCHSTICK NEURO 3.0 LAGG (BURR) ×2 IMPLANT
BUR PRECISION FLUTE 6.0 (BURR) ×2 IMPLANT
CANISTER SUCT 3000ML PPV (MISCELLANEOUS) ×2 IMPLANT
CARTRIDGE OIL MAESTRO DRILL (MISCELLANEOUS) ×1 IMPLANT
DIFFUSER DRILL AIR PNEUMATIC (MISCELLANEOUS) ×2 IMPLANT
DRAPE LAPAROTOMY 100X72X124 (DRAPES) ×2 IMPLANT
DRAPE MICROSCOPE LEICA (MISCELLANEOUS) ×2 IMPLANT
DRAPE SURG 17X23 STRL (DRAPES) ×4 IMPLANT
DRSG OPSITE POSTOP 4X6 (GAUZE/BANDAGES/DRESSINGS) ×1 IMPLANT
ELECT BLADE 4.0 EZ CLEAN MEGAD (MISCELLANEOUS) ×2
ELECT REM PT RETURN 9FT ADLT (ELECTROSURGICAL) ×2
ELECTRODE BLDE 4.0 EZ CLN MEGD (MISCELLANEOUS) ×1 IMPLANT
ELECTRODE REM PT RTRN 9FT ADLT (ELECTROSURGICAL) ×1 IMPLANT
GAUZE 4X4 16PLY ~~LOC~~+RFID DBL (SPONGE) ×1 IMPLANT
GAUZE SPONGE 4X4 12PLY STRL (GAUZE/BANDAGES/DRESSINGS) ×2 IMPLANT
GLOVE EXAM NITRILE XL STR (GLOVE) IMPLANT
GLOVE SURG ENC MOIS LTX SZ8 (GLOVE) ×2 IMPLANT
GLOVE SURG ENC MOIS LTX SZ8.5 (GLOVE) ×2 IMPLANT
GLOVE SURG UNDER POLY LF SZ7 (GLOVE) ×4 IMPLANT
GOWN STRL REUS W/ TWL LRG LVL3 (GOWN DISPOSABLE) IMPLANT
GOWN STRL REUS W/ TWL XL LVL3 (GOWN DISPOSABLE) ×1 IMPLANT
GOWN STRL REUS W/TWL 2XL LVL3 (GOWN DISPOSABLE) IMPLANT
GOWN STRL REUS W/TWL LRG LVL3 (GOWN DISPOSABLE) ×2
GOWN STRL REUS W/TWL XL LVL3 (GOWN DISPOSABLE) ×3
KIT BASIN OR (CUSTOM PROCEDURE TRAY) ×2 IMPLANT
KIT TURNOVER KIT B (KITS) ×2 IMPLANT
NDL HYPO 21X1.5 SAFETY (NEEDLE) IMPLANT
NEEDLE HYPO 21X1.5 SAFETY (NEEDLE) ×2 IMPLANT
NEEDLE HYPO 22GX1.5 SAFETY (NEEDLE) ×2 IMPLANT
NS IRRIG 1000ML POUR BTL (IV SOLUTION) ×2 IMPLANT
OIL CARTRIDGE MAESTRO DRILL (MISCELLANEOUS) ×2
PACK LAMINECTOMY NEURO (CUSTOM PROCEDURE TRAY) ×2 IMPLANT
PAD ARMBOARD 7.5X6 YLW CONV (MISCELLANEOUS) ×5 IMPLANT
PATTIES SURGICAL .5 X1 (DISPOSABLE) IMPLANT
SPONGE SURGIFOAM ABS GEL 100 (HEMOSTASIS) ×2 IMPLANT
SPONGE SURGIFOAM ABS GEL SZ50 (HEMOSTASIS) IMPLANT
STRIP CLOSURE SKIN 1/2X4 (GAUZE/BANDAGES/DRESSINGS) ×2 IMPLANT
SUT VIC AB 1 CT1 18XBRD ANBCTR (SUTURE) ×2 IMPLANT
SUT VIC AB 1 CT1 8-18 (SUTURE) ×2
SUT VIC AB 2-0 CP2 18 (SUTURE) ×4 IMPLANT
TOWEL GREEN STERILE (TOWEL DISPOSABLE) ×2 IMPLANT
TOWEL GREEN STERILE FF (TOWEL DISPOSABLE) ×2 IMPLANT
WATER STERILE IRR 1000ML POUR (IV SOLUTION) ×2 IMPLANT

## 2021-07-29 NOTE — Anesthesia Procedure Notes (Signed)
Procedure Name: Intubation Date/Time: 07/29/2021 7:47 AM Performed by: Lowella Dell, CRNA Pre-anesthesia Checklist: Patient identified, Emergency Drugs available, Suction available and Patient being monitored Patient Re-evaluated:Patient Re-evaluated prior to induction Oxygen Delivery Method: Circle System Utilized Preoxygenation: Pre-oxygenation with 100% oxygen Induction Type: IV induction Ventilation: Mask ventilation without difficulty Laryngoscope Size: Mac and 4 Grade View: Grade I Tube type: Oral Tube size: 7.5 mm Number of attempts: 1 Airway Equipment and Method: Stylet Placement Confirmation: ETT inserted through vocal cords under direct vision, positive ETCO2 and breath sounds checked- equal and bilateral Secured at: 22 cm Tube secured with: Tape Dental Injury: Teeth and Oropharynx as per pre-operative assessment

## 2021-07-29 NOTE — Discharge Summary (Signed)
Physician Discharge Summary     Providing Compassionate, Quality Care - Together   Patient ID: Richard Rivas MRN: [id number] DOB/AGE: 79/05/1941 79 y.o.  Admit date: 07/29/2021 Discharge date: 07/29/2021  Admission Diagnoses: Spinal stenosis of lumbar region with neurogenic claudication  Discharge Diagnoses:  Principal Problem:   Spinal stenosis of lumbar region with neurogenic claudication   Discharged Condition: good  Hospital Course: Patient underwent an L1-2, L2-3, L3-4 and L4-5 laminotomy/foraminotomies by Dr. Arnoldo Morale on 07/29/2021. He was admitted to 3C02  following recovery from anesthesia in the PACU. His postoperative course has been uncomplicated. He has worked with both physical and occupational therapies who feel the patient is ready for discharge home. He is ambulating independently and without difficulty. He is tolerating a normal diet. He is not having any bowel or bladder dysfunction. His pain is well-controlled with oral pain medication. He is ready for discharge home.   Consults: PT/OT  Significant Diagnostic Studies: radiology: DG Lumbar Spine 1 View  Result Date: 07/29/2021 CLINICAL DATA:  Intraoperative localization for bilateral L1 through L5 laminotomy, foraminotomy EXAM: LUMBAR SPINE - 1 VIEW COMPARISON:  Lumbar spine MRI 06/04/2021, lumbar spine radiographs 06/18/2021 FINDINGS: Surgical hardware is noted adjacent to the L3-L4 facet joint. Curvilinear material superior to the hardware likely reflects a sponge. Vertebral body heights are preserved. Alignment is stable. There is multilevel degenerative endplate change. Facet arthropathy is most advanced at L5-S1. There are dense calcifications of the abdominal aorta. IMPRESSION: Surgical hardware adjacent to the L3-L4 facet joint. Electronically Signed   By: Valetta Mole M.D.   On: 07/29/2021 12:13     Treatments: surgery:  Bilateral L1-2, L2-3, L3-4 and L4-5 laminotomy/foraminotomies using micro-dissection to  decompress the bilateral L2, L3, L4 and L5 nerve roots  Discharge Exam: Blood pressure 139/78, pulse 61, temperature 98.3 F (36.8 C), temperature source Oral, resp. rate 16, height 5' 7.5" (1.715 m), weight 81.7 kg, SpO2 95 %.  Alert and oriented x 4 PERRLA CN II-XII grossly intact MAE, Strength and sensation intact Incision is covered with Honeycomb dressing and Steri Strips; Dressing is clean, dry, and intact   Disposition: Discharge disposition: 01-Home or Self Care        Allergies as of 07/29/2021       Reactions   Bee Venom Anaphylaxis        Medication List     TAKE these medications    aspirin 325 MG tablet Take 1 tablet (325 mg total) by mouth daily. **Restart 08/04/2019** What changed: additional instructions   atorvastatin 80 MG tablet Commonly known as: LIPITOR Take 80 mg by mouth daily.   cyclobenzaprine 10 MG tablet Commonly known as: FLEXERIL Take 1 tablet (10 mg total) by mouth 3 (three) times daily as needed for muscle spasms.   docusate sodium 100 MG capsule Commonly known as: COLACE Take 1 capsule (100 mg total) by mouth 2 (two) times daily.   Dodex 1000 MCG/ML injection Generic drug: cyanocobalamin Inject 1,000 mcg into the muscle every 14 (fourteen) days.   isosorbide mononitrate 30 MG 24 hr tablet Commonly known as: IMDUR Take 1 tablet (30 mg total) by mouth daily.   oxyCODONE-acetaminophen 5-325 MG tablet Commonly known as: Percocet Take 1 tablet by mouth every 4 (four) hours as needed for severe pain.   sertraline 50 MG tablet Commonly known as: ZOLOFT Take 50 mg by mouth daily.        Follow-up Information     Newman Pies, MD. Go on 08/27/2021.  Specialty: Neurosurgery Why: First post op appointment is 08/27/2021 at 2:00 PM. Contact information: [address] [place] [address] [phone]                 Signed: Viona Gilmore, DNP, AGNP-C Nurse Practitioner  [facility]  [facility] [address], [place], [place] [address] P: [phone]     F: [phone]  07/29/2021, 3:22 PM

## 2021-07-29 NOTE — H&P (Signed)
Subjective: The patient is a 81 year old white male who has complained of bilateral lower extremity numbness and weakness when he ambulates.  He was worked up with a lumbar MRI which demonstrated severe spinal stenosis at multiple levels.  I discussed the various treatment options with him.  He has decided proceed with surgery.  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)    a.) measured 2.0 cm on 08/03/2020   Aortic atherosclerosis (HCC)    BPPV (benign paroxysmal positional vertigo)    Colon polyps    Coronary artery disease    a.) s/p 3v CABG in 01/2017; LIMA-LAD, SVG-LCx, SVG-PDA   Depression    GERD (gastroesophageal reflux disease)    History of kidney stones    HLD (hyperlipidemia)    Hx of CABG 02/13/2017   3v; LIMA-LAD, SVG-LCx, SVG-PDA   Hypertension    Kidney stones    Myocardial infarction ([place]) 01/2017    Past Surgical History:  Procedure Laterality Date   CERVICAL DISC SURGERY     COLONOSCOPY WITH PROPOFOL N/A 10/11/2015   Procedure: COLONOSCOPY WITH PROPOFOL;  Surgeon: Manya Silvas, MD;  Location: [facility];  Service: Endoscopy;  Laterality: N/A;   COLONOSCOPY WITH PROPOFOL N/A 01/31/2019   Procedure: COLONOSCOPY WITH PROPOFOL;  Surgeon: Toledo, Benay Pike, MD;  Location: [facility];  Service: Gastroenterology;  Laterality: N/A;   CORONARY ARTERY BYPASS GRAFT N/A 02/13/2017   Procedure: CORONARY ARTERY BYPASS GRAFTING (CABG) x3 (LIMA to LAD, SVG to CIRCUMFLEX , SVG to PDA )  , using left internal mammary artery and right leg greater saphenous vein harvested endoscopically;  Surgeon: [name], MD;  Location: [facility];  Service: Open Heart Surgery;  Laterality: N/A;   KIDNEY STONE SURGERY     LAPAROSCOPIC RIGHT COLECTOMY Right 11/14/2014   Procedure: LAPAROSCOPIC RIGHT COLECTOMY;  Surgeon: Leonie Green, MD;  Location: [facility];  Service: General;  Laterality: Right;   LEFT HEART CATH AND CORONARY ANGIOGRAPHY N/A 02/12/2017   Procedure: LEFT HEART CATH AND CORONARY ANGIOGRAPHY and possible pci;  Surgeon: Yolonda Kida, MD;  Location: [facility];  Service: Cardiovascular;  Laterality: N/A;   SHOULDER ARTHROSCOPY W/ ROTATOR CUFF REPAIR Right    TEE WITHOUT CARDIOVERSION N/A 02/13/2017   Procedure: TRANSESOPHAGEAL ECHOCARDIOGRAM (TEE);  Surgeon: [name], MD;  Location: [facility];  Service: Open Heart Surgery;  Laterality: N/A;   XI ROBOTIC ASSISTED INGUINAL HERNIA REPAIR WITH MESH Right 11/12/2020   Procedure: XI ROBOTIC ASSISTED INGUINAL HERNIA REPAIR WITH MESH;  Surgeon: Herbert Pun, MD;  Location: [facility];  Service: General;  Laterality: Right;    Allergies  Allergen Reactions   Bee Venom Anaphylaxis    Social History   Tobacco Use   Smoking status: Former    Years: 40.00    Types: Cigarettes    Quit date: 11/06/1994    Years since quitting: 26.7   Smokeless tobacco: Former    Quit date: 11/05/2012  Substance Use Topics   Alcohol use: Yes    Comment: beer occasionally    Family History  Problem Relation Age of Onset   Heart attack Father    Heart attack Brother    Prior to Admission medications   Medication Sig Start Date End Date Taking? Authorizing Provider  aspirin 325 MG tablet Take 325 mg by mouth daily.  Yes [provider]  atorvastatin (LIPITOR) 80 MG tablet Take 80 mg by mouth daily.   Yes [provider]  DODEX 1000 MCG/ML injection Inject 1,000 mcg into the muscle every 14 (fourteen) days. 05/06/21  Yes [provider]  isosorbide mononitrate (IMDUR) 30 MG 24 hr tablet Take 1 tablet (30 mg total) by mouth daily. 12/09/18 07/22/21 Yes Dunn, Areta Haber, PA-C  sertraline (ZOLOFT) 50 MG tablet Take 50 mg by mouth daily. 08/02/20  Yes [provider]     Review of Systems  Positive ROS: As above  All other systems have been reviewed  and were otherwise negative with the exception of those mentioned in the HPI and as above.  Objective: Vital signs in last 24 hours: Temp:  [97.7 F (36.5 C)] 97.7 F (36.5 C) (02/27 0550) Pulse Rate:  [66] 66 (02/27 0550) Resp:  [17] 17 (02/27 0550) BP: (137)/(61) 137/61 (02/27 0550) SpO2:  [93 %] 93 % (02/27 0550) Weight:  [81.7 kg] 81.7 kg (02/27 0626) Estimated body mass index is 27.79 kg/m as calculated from the following:   Height as of this encounter: 5' 7.5" (1.715 m).   Weight as of this encounter: 81.7 kg.   General Appearance: Alert Head: Normocephalic, without obvious abnormality, atraumatic Eyes: PERRL, conjunctiva/corneas clear, EOM's intact,    Ears: Normal  Throat: Normal  Neck: Supple, Back: unremarkable Lungs: Clear to auscultation bilaterally, respirations unlabored Heart: Regular rate and rhythm, no murmur, rub or gallop Abdomen: Soft, non-tender Extremities: Extremities normal, atraumatic, no cyanosis or edema Skin: unremarkable  NEUROLOGIC:   Mental status: alert and oriented,Motor Exam - grossly normal Sensory Exam - grossly normal Reflexes:  Coordination - grossly normal Gait - grossly normal Balance - grossly normal Cranial Nerves: I: smell Not tested  II: visual acuity  OS: Normal  OD: Normal   II: visual fields Full to confrontation  II: pupils Equal, round, reactive to light  III,VII: ptosis None  III,IV,VI: extraocular muscles  Full ROM  V: mastication Normal  V: facial light touch sensation  Normal  V,VII: corneal reflex  Present  VII: facial muscle function - upper  Normal  VII: facial muscle function - lower Normal  VIII: hearing Not tested  IX: soft palate elevation  Normal  IX,X: gag reflex Present  XI: trapezius strength  5/5  XI: sternocleidomastoid strength 5/5  XI: neck flexion strength  5/5  XII: tongue strength  Normal    Data Review Lab Results  Component Value Date   WBC 9.6 07/25/2021   HGB 14.6 07/25/2021    HCT 45.4 07/25/2021   MCV 91.5 07/25/2021   PLT 215 07/25/2021   Lab Results  Component Value Date   NA 140 07/25/2021   K 4.9 07/25/2021   CL 106 07/25/2021   CO2 28 07/25/2021   BUN 13 07/25/2021   CREATININE 0.97 07/25/2021   GLUCOSE 96 07/25/2021   Lab Results  Component Value Date   INR 1.26 02/13/2017    Assessment/Plan: Lumbar spinal stenosis, lumbar radiculopathy, neurogenic claudication: I have discussed the situation with the patient.  I reviewed his imaging studies with him and pointed out the abnormalities.  We have discussed the various treatment options including surgery.  I have described the surgical treatment option of an L1-2, L2-3, L3-4 and L4-5 laminotomy/foraminotomies.  I have shown him surgical models.  I have given him a surgical pamphlet.  We have discussed the risk, benefits, alternatives, expected postop course, and likelihood of achieving  our goals with surgery.  I have answered all his questions.  He has decided proceed with surgery.   Ophelia Charter 07/29/2021 7:16 AM

## 2021-07-29 NOTE — Transfer of Care (Signed)
Immediate Anesthesia Transfer of Care Note  Patient: KAYCEN WHITWORTH  Procedure(s) Performed: BILATERAL LUMBAR ONE TO LUMBAR FIVE LAMINOTOMY, FORAMINOTOMY (Bilateral)  Patient Location: PACU  Anesthesia Type:General  Level of Consciousness: awake, oriented and patient cooperative  Airway & Oxygen Therapy: Patient Spontanous Breathing and Patient connected to face mask oxygen  Post-op Assessment: Report given to RN, Post -op Vital signs reviewed and stable and Patient moving all extremities X 4  Post vital signs: Reviewed and stable  Last Vitals:  Vitals Value Taken Time  BP 114/72   Temp    Pulse 66 07/29/21 1025  Resp 17 07/29/21 1025  SpO2 96 % 07/29/21 1025  Vitals shown include unvalidated device data.  Last Pain:  Vitals:   07/29/21 0626  TempSrc:   PainSc: 0-No pain      Patients Stated Pain Goal: 0 (25/00/37 0488)  Complications: No notable events documented.

## 2021-07-29 NOTE — Discharge Instructions (Addendum)
Wound Care Keep incision covered and dry for two days.    Do not put any creams, lotions, or ointments on incision. Leave steri-strips on back.  They will fall off by themselves. You are fine to shower.  Activity Walk each and every day, increasing distance each day. No lifting greater than 5 lbs.  Avoid excessive back motion. No driving for 2 weeks; may ride as a passenger locally.  Diet Resume your normal diet.   Call Your Doctor If Any of These Occur Redness, drainage, or swelling at the wound.  Temperature greater than 101 degrees. Severe pain not relieved by pain medication. Incision starts to come apart.

## 2021-07-29 NOTE — Anesthesia Postprocedure Evaluation (Signed)
Anesthesia Post Note  Patient: Richard Rivas  Procedure(s) Performed: BILATERAL LUMBAR ONE TO LUMBAR FIVE LAMINOTOMY, FORAMINOTOMY (Bilateral)     Patient location during evaluation: PACU Anesthesia Type: General Level of consciousness: awake and alert, patient cooperative and oriented Pain management: pain level controlled Vital Signs Assessment: post-procedure vital signs reviewed and stable Respiratory status: spontaneous breathing, nonlabored ventilation and respiratory function stable Cardiovascular status: blood pressure returned to baseline and stable Postop Assessment: no apparent nausea or vomiting Anesthetic complications: no   No notable events documented.  Last Vitals:  Vitals:   07/29/21 1040 07/29/21 1055  BP: (!) 134/58 131/62  Pulse: 71 68  Resp: 16 18  Temp:    SpO2: 95% 95%    Last Pain:  Vitals:   07/29/21 1055  TempSrc:   PainSc: 0-No pain    LLE Motor Response: Purposeful movement (07/29/21 1055) LLE Sensation: Full sensation (07/29/21 1055) RLE Motor Response: Purposeful movement (07/29/21 1055) RLE Sensation: Full sensation (07/29/21 1055)      Alyss Granato,E. Samiel Peel

## 2021-07-29 NOTE — Progress Notes (Signed)
Patient alert and oriented, voiding adequately, MAE well with no difficulty. Incision area cdi with no s/s of infection. Patient discharged home per order. Patient and family stated understanding of discharge instructions given. Patient has an appointment with Dr. Arnoldo Morale

## 2021-07-29 NOTE — Plan of Care (Signed)
NA

## 2021-07-29 NOTE — Op Note (Signed)
Brief history: The patient is a 81 year old white male who is complained of bilateral leg numbness and weakness worsen with standing walking consistent with neurogenic claudication.  He failed medical management and was worked up with a lumbar MRI which demonstrated multilevel multifactorial spinal stenosis.  I discussed the various treatment options with him.  He has decided proceed with surgery.  Preoperative diagnosis: Lumbar spinal stenosis with neurogenic claudication  Postoperative diagnosis: The same  Procedure: Bilateral L1-2, L2-3, L3-4 and L4-5 laminotomy/foraminotomies using micro-dissection to decompress the bilateral L2, L3, L4 and L5 nerve roots  Surgeon: Dr. Earle Gell  Asst.: Arnetha Massy NP  Anesthesia: Gen. endotracheal  Estimated blood loss: 125 cc  Drains: None  Complications: None  Description of procedure: The patient was brought to the operating room by the anesthesia team. General endotracheal anesthesia was induced. The patient was turned to the prone position on the Wilson frame. The patient's lumbosacral region was then prepared with Betadine scrub and Betadine solution. Sterile drapes were applied.  I then injected the area to be incised with Marcaine with epinephrine solution. I then used a scalpel to make a linear midline incision over the L1-2, L2-3, L3-4 and L4-5 intervertebral disc space. I then used electrocautery to perform a left sided subperiosteal dissection exposing the spinous process and lamina of L1-2, L2-3, L3-4 and L4-5. We obtained intraoperative radiograph to confirm our location. I then inserted the [facility] retractor for exposure.  We then brought the operative microscope into the field. Under its magnification and illumination we completed the microdissection. I used a high-speed drill to perform a laminotomy at L4-5 on the left. I then used a Kerrison punches to widen the laminotomy and removed the ligamentum flavum at L4-5 on the left.  We then used microdissection to free up the thecal sac from the epidural tissue.  I drilled across the midline drawing off the medial right L4-5 lamina.  I then used a Kerrison punch to perform a foraminotomy at about the bilateral L5 nerve root, decompressing the thecal sac and the bilateral L5 nerve roots.  We then repeated this procedure in analogous fashion at L3-4, L2-3 and L1-2 decompress the thecal sac at L3-4, L2-3 and L1-2 and decompressing the bilateral L4, L3 and L2 nerve roots.  I then palpated along the ventral surface of the thecal sac at L1-2, L2-3, L3-4 and L4-5 and along exit route of the bilateral L2, L3, L4 and L5 nerve root and noted that the neural structures were well decompressed. This completed the decompression.  We then obtained hemostasis using bipolar electrocautery. We irrigated the wound out with bacitracin solution. We then removed the retractor. We then reapproximated the patient's thoracolumbar fascia with interrupted #1 Vicryl suture. We then reapproximated the patient's subcutaneous tissue with interrupted 2-0 Vicryl suture. We then reapproximated patient's skin with Steri-Strips and benzoin. The was then coated with bacitracin ointment. The drapes were removed. The patient was subsequently returned to the supine position where they were extubated by the anesthesia team. The patient was then transported to the postanesthesia care unit in stable condition. All sponge instrument and needle counts were reportedly correct at the end of this case.

## 2021-07-29 NOTE — Progress Notes (Signed)
OT Cancellation Note  Patient Details Name: Richard Rivas MRN: [id number] DOB: February 28, 1941   Cancelled Treatment:    Reason Eval/Treat Not Completed: OT screened, no needs identified, will sign off--per speaking with evaluating PT.  [place], OTR/L Acute Rehab Services Pager [phone] Office [phone]    Almon Register 07/29/2021, 4:20 PM

## 2021-07-29 NOTE — Evaluation (Signed)
Physical Therapy Evaluation and discharge Patient Details Name: Richard Rivas MRN: [id number] DOB: 1940/06/29 Today's Date: 07/29/2021  History of Present Illness  Pt is a 81 y/o male s/p L1-5 laminotomy on 2/27. PMH includes AAA, CAD s/p CABG, HTN, and MI.  Clinical Impression  Patient evaluated by Physical Therapy with no further acute PT needs identified. All education has been completed and the patient has no further questions. Pt overall steady with gait and stair navigation. No LOB noted. Educated about precautions, generalized walking program, car transfers, and performing ADL tasks while maintaining precautions. Reports wife can assist if needed. See below for any follow-up Physical Therapy or equipment needs. PT is signing off. Thank you for this referral. If needs change, please re-consult.         Recommendations for follow up therapy are one component of a multi-disciplinary discharge planning process, led by the attending physician.  Recommendations may be updated based on patient status, additional functional criteria and insurance authorization.  Follow Up Recommendations No PT follow up    Assistance Recommended at Discharge Intermittent Supervision/Assistance  Patient can return home with the following  Assist for transportation;Assistance with cooking/housework    Equipment Recommendations None recommended by PT  Recommendations for Other Services       Functional Status Assessment Patient has had a recent decline in their functional status and demonstrates the ability to make significant improvements in function in a reasonable and predictable amount of time.     Precautions / Restrictions Precautions Precautions: Back Precaution Booklet Issued: Yes (comment) Precaution Comments: Reviewed back precautions with pt. Restrictions Weight Bearing Restrictions: No      Mobility  Bed Mobility Overal bed mobility: Needs Assistance Bed Mobility: Rolling, Sidelying to  Sit, Sit to Sidelying Rolling: Supervision Sidelying to sit: Supervision     Sit to sidelying: Supervision General bed mobility comments: Supervision for safety. Cues for log roll technique    Transfers Overall transfer level: Needs assistance Equipment used: None Transfers: Sit to/from Stand Sit to Stand: Supervision           General transfer comment: Supervision for safety    Ambulation/Gait Ambulation/Gait assistance: Supervision Gait Distance (Feet): 300 Feet Assistive device: None Gait Pattern/deviations: Step-through pattern, Decreased stride length Gait velocity: Decreased     General Gait Details: Overall steady. No LOB noted. Supervision for safety. Educated about generalized walking program to perform at home.  Stairs Stairs: Yes Stairs assistance: Supervision Stair Management: One rail Left, Alternating pattern, Forwards Number of Stairs: 4 General stair comments: OVerall steady stair navigation. No LOB noted.  Wheelchair Mobility    Modified Rankin (Stroke Patients Only)       Balance Overall balance assessment: No apparent balance deficits (not formally assessed)                                           Pertinent Vitals/Pain Pain Assessment Pain Assessment: Faces Faces Pain Scale: Hurts a little bit Pain Location: back Pain Descriptors / Indicators: Grimacing, Guarding Pain Intervention(s): Limited activity within patient's tolerance, Monitored during session, Repositioned    Home Living Family/patient expects to be discharged to:: Private residence Living Arrangements: Spouse/significant other Available Help at Discharge: Family Type of Home: House Home Access: Stairs to enter Entrance Stairs-Rails: Left Entrance Stairs-Number of Steps: 4   Home Layout: One level Home Equipment: Conservation officer, nature (2 wheels);Cane - single  point;Shower seat;BSC/3in1      Prior Function Prior Level of Function : Independent/Modified  Independent                     Hand Dominance        Extremity/Trunk Assessment   Upper Extremity Assessment Upper Extremity Assessment: Overall WFL for tasks assessed    Lower Extremity Assessment Lower Extremity Assessment: Generalized weakness (pt reports numbness has improved)    Cervical / Trunk Assessment Cervical / Trunk Assessment: Back Surgery  Communication   Communication: No difficulties  Cognition Arousal/Alertness: Awake/alert Behavior During Therapy: WFL for tasks assessed/performed Overall Cognitive Status: Within Functional Limits for tasks assessed                                          General Comments General comments (skin integrity, edema, etc.): Educated about how to perform ADL tasks including LB dressing. Had pt perform figure 4 position.    Exercises     Assessment/Plan    PT Assessment Patient does not need any further PT services  PT Problem List         PT Treatment Interventions      PT Goals (Current goals can be found in the Care Plan section)  Acute Rehab PT Goals Patient Stated Goal: to go home PT Goal Formulation: With patient Time For Goal Achievement: 07/29/21 Potential to Achieve Goals: Good    Frequency       Co-evaluation               AM-PAC PT "6 Clicks" Mobility  Outcome Measure Help needed turning from your back to your side while in a flat bed without using bedrails?: None Help needed moving from lying on your back to sitting on the side of a flat bed without using bedrails?: None Help needed moving to and from a bed to a chair (including a wheelchair)?: None Help needed standing up from a chair using your arms (e.g., wheelchair or bedside chair)?: None Help needed to walk in hospital room?: None Help needed climbing 3-5 steps with a railing? : None 6 Click Score: 24    End of Session Equipment Utilized During Treatment: Gait belt Activity Tolerance: Patient tolerated  treatment well Patient left: in bed;with call bell/phone within reach Nurse Communication: Mobility status PT Visit Diagnosis: Other abnormalities of gait and mobility (R26.89)    Time: 5790-3833 PT Time Calculation (min) (ACUTE ONLY): 14 min   Charges:   PT Evaluation $PT Eval Low Complexity: 1 Low          Lou Miner, DPT  Acute Rehabilitation Services  Pager: [phone] Office: [phone]   Rudean Hitt 07/29/2021, 3:46 PM

## 2021-07-30 ENCOUNTER — Encounter (HOSPITAL_COMMUNITY): Payer: Self-pay | Admitting: Neurosurgery

## 2021-10-07 DIAGNOSIS — E782 Mixed hyperlipidemia: Secondary | ICD-10-CM | POA: Diagnosis not present

## 2021-10-07 DIAGNOSIS — E538 Deficiency of other specified B group vitamins: Secondary | ICD-10-CM | POA: Diagnosis not present

## 2021-10-07 DIAGNOSIS — R739 Hyperglycemia, unspecified: Secondary | ICD-10-CM | POA: Diagnosis not present

## 2021-10-07 DIAGNOSIS — Z125 Encounter for screening for malignant neoplasm of prostate: Secondary | ICD-10-CM | POA: Diagnosis not present

## 2021-10-16 DIAGNOSIS — E1151 Type 2 diabetes mellitus with diabetic peripheral angiopathy without gangrene: Secondary | ICD-10-CM | POA: Diagnosis not present

## 2021-10-16 DIAGNOSIS — E782 Mixed hyperlipidemia: Secondary | ICD-10-CM | POA: Diagnosis not present

## 2021-10-16 DIAGNOSIS — I700 Atherosclerosis of aorta: Secondary | ICD-10-CM | POA: Diagnosis not present

## 2021-10-16 DIAGNOSIS — Z0000 Encounter for general adult medical examination without abnormal findings: Secondary | ICD-10-CM | POA: Diagnosis not present

## 2021-10-16 DIAGNOSIS — E538 Deficiency of other specified B group vitamins: Secondary | ICD-10-CM | POA: Diagnosis not present

## 2021-10-16 DIAGNOSIS — F330 Major depressive disorder, recurrent, mild: Secondary | ICD-10-CM | POA: Diagnosis not present

## 2021-11-05 DIAGNOSIS — H353132 Nonexudative age-related macular degeneration, bilateral, intermediate dry stage: Secondary | ICD-10-CM | POA: Diagnosis not present

## 2021-11-05 DIAGNOSIS — H40003 Preglaucoma, unspecified, bilateral: Secondary | ICD-10-CM | POA: Diagnosis not present

## 2021-11-12 DIAGNOSIS — H353132 Nonexudative age-related macular degeneration, bilateral, intermediate dry stage: Secondary | ICD-10-CM | POA: Diagnosis not present

## 2021-11-12 DIAGNOSIS — H2513 Age-related nuclear cataract, bilateral: Secondary | ICD-10-CM | POA: Diagnosis not present

## 2021-11-12 DIAGNOSIS — H40003 Preglaucoma, unspecified, bilateral: Secondary | ICD-10-CM | POA: Diagnosis not present

## 2021-12-17 DIAGNOSIS — Z6827 Body mass index (BMI) 27.0-27.9, adult: Secondary | ICD-10-CM | POA: Diagnosis not present

## 2021-12-17 DIAGNOSIS — M48062 Spinal stenosis, lumbar region with neurogenic claudication: Secondary | ICD-10-CM | POA: Diagnosis not present

## 2022-03-31 ENCOUNTER — Encounter (INDEPENDENT_AMBULATORY_CARE_PROVIDER_SITE_OTHER): Payer: Self-pay

## 2022-04-02 DIAGNOSIS — Z23 Encounter for immunization: Secondary | ICD-10-CM | POA: Diagnosis not present

## 2022-04-02 DIAGNOSIS — E1151 Type 2 diabetes mellitus with diabetic peripheral angiopathy without gangrene: Secondary | ICD-10-CM | POA: Diagnosis not present

## 2022-04-02 DIAGNOSIS — S83411A Sprain of medial collateral ligament of right knee, initial encounter: Secondary | ICD-10-CM | POA: Diagnosis not present

## 2022-04-02 DIAGNOSIS — M25561 Pain in right knee: Secondary | ICD-10-CM | POA: Diagnosis not present

## 2022-04-02 DIAGNOSIS — S83241A Other tear of medial meniscus, current injury, right knee, initial encounter: Secondary | ICD-10-CM | POA: Diagnosis not present

## 2022-04-04 ENCOUNTER — Other Ambulatory Visit: Payer: Self-pay | Admitting: Orthopedic Surgery

## 2022-04-04 DIAGNOSIS — S83241A Other tear of medial meniscus, current injury, right knee, initial encounter: Secondary | ICD-10-CM

## 2022-04-04 DIAGNOSIS — S83411A Sprain of medial collateral ligament of right knee, initial encounter: Secondary | ICD-10-CM

## 2022-04-11 DIAGNOSIS — E1151 Type 2 diabetes mellitus with diabetic peripheral angiopathy without gangrene: Secondary | ICD-10-CM | POA: Diagnosis not present

## 2022-04-11 DIAGNOSIS — E538 Deficiency of other specified B group vitamins: Secondary | ICD-10-CM | POA: Diagnosis not present

## 2022-04-11 DIAGNOSIS — E782 Mixed hyperlipidemia: Secondary | ICD-10-CM | POA: Diagnosis not present

## 2022-04-14 ENCOUNTER — Ambulatory Visit
Admission: RE | Admit: 2022-04-14 | Discharge: 2022-04-14 | Disposition: A | Discharge status: Home / Self Care | Payer: PPO | Source: Ambulatory Visit | Attending: Orthopedic Surgery | Admitting: Orthopedic Surgery

## 2022-04-14 DIAGNOSIS — S83241A Other tear of medial meniscus, current injury, right knee, initial encounter: Secondary | ICD-10-CM

## 2022-04-14 DIAGNOSIS — M25561 Pain in right knee: Secondary | ICD-10-CM | POA: Diagnosis not present

## 2022-04-14 DIAGNOSIS — S83411A Sprain of medial collateral ligament of right knee, initial encounter: Secondary | ICD-10-CM

## 2022-04-28 DIAGNOSIS — Z125 Encounter for screening for malignant neoplasm of prostate: Secondary | ICD-10-CM | POA: Diagnosis not present

## 2022-04-28 DIAGNOSIS — E1151 Type 2 diabetes mellitus with diabetic peripheral angiopathy without gangrene: Secondary | ICD-10-CM | POA: Diagnosis not present

## 2022-04-28 DIAGNOSIS — E538 Deficiency of other specified B group vitamins: Secondary | ICD-10-CM | POA: Diagnosis not present

## 2022-05-19 DIAGNOSIS — H40003 Preglaucoma, unspecified, bilateral: Secondary | ICD-10-CM | POA: Diagnosis not present

## 2022-05-19 DIAGNOSIS — H353132 Nonexudative age-related macular degeneration, bilateral, intermediate dry stage: Secondary | ICD-10-CM | POA: Diagnosis not present

## 2022-05-19 DIAGNOSIS — H2513 Age-related nuclear cataract, bilateral: Secondary | ICD-10-CM | POA: Diagnosis not present

## 2022-05-22 DIAGNOSIS — R52 Pain, unspecified: Secondary | ICD-10-CM | POA: Diagnosis not present

## 2022-05-22 DIAGNOSIS — Z85828 Personal history of other malignant neoplasm of skin: Secondary | ICD-10-CM | POA: Diagnosis not present

## 2022-05-22 DIAGNOSIS — L821 Other seborrheic keratosis: Secondary | ICD-10-CM | POA: Diagnosis not present

## 2022-05-22 DIAGNOSIS — L814 Other melanin hyperpigmentation: Secondary | ICD-10-CM | POA: Diagnosis not present

## 2022-05-22 DIAGNOSIS — L570 Actinic keratosis: Secondary | ICD-10-CM | POA: Diagnosis not present

## 2022-05-22 DIAGNOSIS — C44622 Squamous cell carcinoma of skin of right upper limb, including shoulder: Secondary | ICD-10-CM | POA: Diagnosis not present

## 2022-05-22 DIAGNOSIS — D1801 Hemangioma of skin and subcutaneous tissue: Secondary | ICD-10-CM | POA: Diagnosis not present

## 2022-05-22 DIAGNOSIS — D3612 Benign neoplasm of peripheral nerves and autonomic nervous system, upper limb, including shoulder: Secondary | ICD-10-CM | POA: Diagnosis not present

## 2022-06-17 DIAGNOSIS — M48062 Spinal stenosis, lumbar region with neurogenic claudication: Secondary | ICD-10-CM | POA: Diagnosis not present

## 2022-06-17 DIAGNOSIS — R059 Cough, unspecified: Secondary | ICD-10-CM | POA: Diagnosis not present

## 2022-06-17 DIAGNOSIS — R053 Chronic cough: Secondary | ICD-10-CM | POA: Diagnosis not present

## 2022-06-17 DIAGNOSIS — Z8616 Personal history of COVID-19: Secondary | ICD-10-CM | POA: Diagnosis not present

## 2022-10-06 DIAGNOSIS — H43813 Vitreous degeneration, bilateral: Secondary | ICD-10-CM | POA: Diagnosis not present

## 2022-10-06 DIAGNOSIS — H2513 Age-related nuclear cataract, bilateral: Secondary | ICD-10-CM | POA: Diagnosis not present

## 2022-10-06 DIAGNOSIS — H353132 Nonexudative age-related macular degeneration, bilateral, intermediate dry stage: Secondary | ICD-10-CM | POA: Diagnosis not present

## 2022-10-06 DIAGNOSIS — H40003 Preglaucoma, unspecified, bilateral: Secondary | ICD-10-CM | POA: Diagnosis not present

## 2022-10-23 DIAGNOSIS — E1151 Type 2 diabetes mellitus with diabetic peripheral angiopathy without gangrene: Secondary | ICD-10-CM | POA: Diagnosis not present

## 2022-10-23 DIAGNOSIS — E538 Deficiency of other specified B group vitamins: Secondary | ICD-10-CM | POA: Diagnosis not present

## 2022-10-23 DIAGNOSIS — Z125 Encounter for screening for malignant neoplasm of prostate: Secondary | ICD-10-CM | POA: Diagnosis not present

## 2022-10-30 DIAGNOSIS — F330 Major depressive disorder, recurrent, mild: Secondary | ICD-10-CM | POA: Diagnosis not present

## 2022-10-30 DIAGNOSIS — Z0000 Encounter for general adult medical examination without abnormal findings: Secondary | ICD-10-CM | POA: Diagnosis not present

## 2022-10-30 DIAGNOSIS — I700 Atherosclerosis of aorta: Secondary | ICD-10-CM | POA: Diagnosis not present

## 2022-10-30 DIAGNOSIS — E538 Deficiency of other specified B group vitamins: Secondary | ICD-10-CM | POA: Diagnosis not present

## 2022-10-30 DIAGNOSIS — E1151 Type 2 diabetes mellitus with diabetic peripheral angiopathy without gangrene: Secondary | ICD-10-CM | POA: Diagnosis not present

## 2023-04-09 DIAGNOSIS — H43813 Vitreous degeneration, bilateral: Secondary | ICD-10-CM | POA: Diagnosis not present

## 2023-04-09 DIAGNOSIS — H40003 Preglaucoma, unspecified, bilateral: Secondary | ICD-10-CM | POA: Diagnosis not present

## 2023-04-09 DIAGNOSIS — H2513 Age-related nuclear cataract, bilateral: Secondary | ICD-10-CM | POA: Diagnosis not present

## 2023-04-09 DIAGNOSIS — H353132 Nonexudative age-related macular degeneration, bilateral, intermediate dry stage: Secondary | ICD-10-CM | POA: Diagnosis not present

## 2023-04-28 DIAGNOSIS — E538 Deficiency of other specified B group vitamins: Secondary | ICD-10-CM | POA: Diagnosis not present

## 2023-04-28 DIAGNOSIS — E1151 Type 2 diabetes mellitus with diabetic peripheral angiopathy without gangrene: Secondary | ICD-10-CM | POA: Diagnosis not present

## 2023-05-05 DIAGNOSIS — Z125 Encounter for screening for malignant neoplasm of prostate: Secondary | ICD-10-CM | POA: Diagnosis not present

## 2023-05-05 DIAGNOSIS — L720 Epidermal cyst: Secondary | ICD-10-CM | POA: Diagnosis not present

## 2023-05-05 DIAGNOSIS — I482 Chronic atrial fibrillation, unspecified: Secondary | ICD-10-CM | POA: Diagnosis not present

## 2023-05-05 DIAGNOSIS — Z85828 Personal history of other malignant neoplasm of skin: Secondary | ICD-10-CM | POA: Diagnosis not present

## 2023-05-05 DIAGNOSIS — E1151 Type 2 diabetes mellitus with diabetic peripheral angiopathy without gangrene: Secondary | ICD-10-CM | POA: Diagnosis not present

## 2023-05-05 DIAGNOSIS — L821 Other seborrheic keratosis: Secondary | ICD-10-CM | POA: Diagnosis not present

## 2023-05-05 DIAGNOSIS — I4891 Unspecified atrial fibrillation: Secondary | ICD-10-CM | POA: Diagnosis not present

## 2023-05-05 DIAGNOSIS — I4892 Unspecified atrial flutter: Secondary | ICD-10-CM | POA: Diagnosis not present

## 2023-05-05 DIAGNOSIS — Z23 Encounter for immunization: Secondary | ICD-10-CM | POA: Diagnosis not present

## 2023-05-05 DIAGNOSIS — L570 Actinic keratosis: Secondary | ICD-10-CM | POA: Diagnosis not present

## 2023-05-05 DIAGNOSIS — E538 Deficiency of other specified B group vitamins: Secondary | ICD-10-CM | POA: Diagnosis not present

## 2023-05-05 DIAGNOSIS — D1801 Hemangioma of skin and subcutaneous tissue: Secondary | ICD-10-CM | POA: Diagnosis not present

## 2023-05-05 DIAGNOSIS — L814 Other melanin hyperpigmentation: Secondary | ICD-10-CM | POA: Diagnosis not present

## 2023-10-08 DIAGNOSIS — H40003 Preglaucoma, unspecified, bilateral: Secondary | ICD-10-CM | POA: Diagnosis not present

## 2023-10-15 DIAGNOSIS — H40003 Preglaucoma, unspecified, bilateral: Secondary | ICD-10-CM | POA: Diagnosis not present

## 2023-10-15 DIAGNOSIS — H43813 Vitreous degeneration, bilateral: Secondary | ICD-10-CM | POA: Diagnosis not present

## 2023-10-15 DIAGNOSIS — H353132 Nonexudative age-related macular degeneration, bilateral, intermediate dry stage: Secondary | ICD-10-CM | POA: Diagnosis not present

## 2023-10-15 DIAGNOSIS — H2513 Age-related nuclear cataract, bilateral: Secondary | ICD-10-CM | POA: Diagnosis not present

## 2023-11-02 DIAGNOSIS — Z125 Encounter for screening for malignant neoplasm of prostate: Secondary | ICD-10-CM | POA: Diagnosis not present

## 2023-11-02 DIAGNOSIS — E538 Deficiency of other specified B group vitamins: Secondary | ICD-10-CM | POA: Diagnosis not present

## 2023-11-02 DIAGNOSIS — E1151 Type 2 diabetes mellitus with diabetic peripheral angiopathy without gangrene: Secondary | ICD-10-CM | POA: Diagnosis not present

## 2023-11-09 DIAGNOSIS — Z1331 Encounter for screening for depression: Secondary | ICD-10-CM | POA: Diagnosis not present

## 2023-11-09 DIAGNOSIS — I482 Chronic atrial fibrillation, unspecified: Secondary | ICD-10-CM | POA: Diagnosis not present

## 2023-11-09 DIAGNOSIS — F339 Major depressive disorder, recurrent, unspecified: Secondary | ICD-10-CM | POA: Diagnosis not present

## 2023-11-09 DIAGNOSIS — I25730 Atherosclerosis of nonautologous biological coronary artery bypass graft(s) with unstable angina pectoris: Secondary | ICD-10-CM | POA: Diagnosis not present

## 2023-11-09 DIAGNOSIS — Z0000 Encounter for general adult medical examination without abnormal findings: Secondary | ICD-10-CM | POA: Diagnosis not present

## 2023-11-09 DIAGNOSIS — E1151 Type 2 diabetes mellitus with diabetic peripheral angiopathy without gangrene: Secondary | ICD-10-CM | POA: Diagnosis not present

## 2023-11-09 DIAGNOSIS — E538 Deficiency of other specified B group vitamins: Secondary | ICD-10-CM | POA: Diagnosis not present

## 2024-01-12 ENCOUNTER — Encounter: Payer: Self-pay | Admitting: Cardiovascular Disease

## 2024-01-12 ENCOUNTER — Ambulatory Visit: Attending: Cardiovascular Disease | Admitting: Cardiovascular Disease

## 2024-01-12 VITALS — BP 118/65 | HR 101 | Ht 67.5 in | Wt 174.2 lb

## 2024-01-12 DIAGNOSIS — I2510 Atherosclerotic heart disease of native coronary artery without angina pectoris: Secondary | ICD-10-CM | POA: Diagnosis not present

## 2024-01-12 DIAGNOSIS — I4891 Unspecified atrial fibrillation: Secondary | ICD-10-CM

## 2024-01-12 DIAGNOSIS — E782 Mixed hyperlipidemia: Secondary | ICD-10-CM | POA: Diagnosis not present

## 2024-01-12 MED ORDER — METOPROLOL TARTRATE 25 MG PO TABS: 25.0000 mg | ORAL_TABLET | Freq: Two times a day (BID) | ORAL | 3 refills | Status: DC

## 2024-01-12 MED ORDER — APIXABAN 5 MG PO TABS: 5.0000 mg | ORAL_TABLET | Freq: Two times a day (BID) | ORAL | 11 refills | Status: DC

## 2024-01-12 NOTE — Patient Instructions (Signed)
 Medication Instructions:  Your physician recommends the following medication changes.  STOP TAKING: Aspirin  Isosorbide   START TAKING: Eliquis  5 mg twice daily Metoprolol  Tartrate 25 mg twice daily  *If you need a refill on your cardiac medications before your next appointment, please call your pharmacy*  Lab Work: None ordered If you have labs (blood work) drawn today and your tests are completely normal, you will receive your results only by: MyChart Message (if you have MyChart) OR A paper copy in the mail If you have any lab test that is abnormal or we need to change your treatment, we will call you to review the results.  Testing/Procedures: Your physician has requested that you have an echocardiogram. Echocardiography is a painless test that uses sound waves to create images of your heart. It provides your doctor with information about the size and shape of your heart and how well your heart's chambers and valves are working.   You may receive an ultrasound enhancing agent through an IV if needed to better visualize your heart during the echo. This procedure takes approximately one hour.  There are no restrictions for this procedure.  This will take place at [address] [facility] [address], [place] [address]  Please note: We ask at that you not bring children with you during ultrasound (echo/ vascular) testing. Due to room size and safety concerns, children are not allowed in the ultrasound rooms during exams. Our front office staff cannot provide observation of children in our lobby area while testing is being conducted. An adult accompanying a patient to their appointment will only be allowed in the ultrasound room at the discretion of the ultrasound technician under special circumstances. We apologize for any inconvenience.   Follow-Up: At [facility], you and your health needs are our priority.  As part of our continuing mission to provide you with  exceptional heart care, our providers are all part of one team.  This team includes your primary Cardiologist (physician) and Advanced Practice Providers or APPs (Physician Assistants and Nurse Practitioners) who all work together to provide you with the care you need, when you need it.  Your next appointment:   Follow up with Dr. Darron or APP after the echo

## 2024-01-12 NOTE — Progress Notes (Signed)
 Cardiology Office Note   Date:  01/12/2024   ID:  Richard Rivas, DOB May 04, 1941, MRN [id number]  PCP:  Cleotilde Oneil FALCON, MD  Cardiologist:   Deatrice Cage, MD   Chief Complaint  Patient presents with   Follow-up    OD 12 month f/u c/o dizziness, unusual sweating/cold. Meds reviewed verbally with pt.      History of Present Illness: Richard Rivas is a 83 y.o. male who presents for a follow-up visit regarding coronary artery disease . He has not been seen by me since 2023. The patient has known history of prior tobacco use and family history of coronary artery disease.  He had a small non-ST elevation myocardial infarction in September 2018.  Cardiac catheterization showed severe distal left main stenosis into the ostium of the LAD and left circumflex.  There was mild RCA disease.  Ejection fraction was normal.  The patient underwent CABG in September, 2018 with LIMA to LAD, SVG to left circumflex and SVG to PDA.  He had atypical chest pain in the past and was evaluated with a Lexiscan  Myoview  in July 2020 which showed no evidence of ischemia with normal ejection fraction.    It appears that he saw Dr. Cleotilde last year and was noted to be in atrial fibrillation.  He was placed on Eliquis  but did not continue the medication.  He is coming now with increased fatigue and excessive sweating with exertion.  He denies chest pain.  He has minimal shortness of breath.  He is noted to be in atrial fibrillation today.  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)    a.) measured 2.0 cm on 08/03/2020   Aortic atherosclerosis (HCC)    BPPV (benign paroxysmal positional vertigo)    Colon polyps    Coronary artery disease    a.) s/p 3v CABG in 01/2017; LIMA-LAD, SVG-LCx, SVG-PDA   Depression    GERD (gastroesophageal reflux disease)    History of kidney stones     HLD (hyperlipidemia)    Hx of CABG 02/13/2017   3v; LIMA-LAD, SVG-LCx, SVG-PDA   Hypertension    Kidney stones    Myocardial infarction (HCC) 01/2017    Past Surgical History:  Procedure Laterality Date   CERVICAL DISC SURGERY     COLONOSCOPY WITH PROPOFOL  N/A 10/11/2015   Procedure: COLONOSCOPY WITH PROPOFOL ;  Surgeon: Lamar ONEIDA Holmes, MD;  Location: [facility] ENDOSCOPY;  Service: Endoscopy;  Laterality: N/A;   COLONOSCOPY WITH PROPOFOL  N/A 01/31/2019   Procedure: COLONOSCOPY WITH PROPOFOL ;  Surgeon: Toledo, Ladell POUR, MD;  Location: [facility] ENDOSCOPY;  Service: Gastroenterology;  Laterality: N/A;   CORONARY ARTERY BYPASS GRAFT N/A 02/13/2017   Procedure: CORONARY ARTERY BYPASS GRAFTING (CABG) x3 (LIMA to LAD, SVG to CIRCUMFLEX , SVG to PDA )  , using left internal mammary artery and right leg greater saphenous vein harvested endoscopically;  Surgeon: Lucas Dorise POUR, MD;  Location: [facility] OR;  Service: Open Heart Surgery;  Laterality: N/A;   KIDNEY STONE SURGERY     LAPAROSCOPIC RIGHT COLECTOMY Right 11/14/2014   Procedure: LAPAROSCOPIC RIGHT COLECTOMY;  Surgeon: Larinda Unknown Sharps, MD;  Location: [facility] ORS;  Service: General;  Laterality: Right;   LEFT HEART CATH AND CORONARY ANGIOGRAPHY N/A 02/12/2017   Procedure: LEFT HEART CATH AND CORONARY ANGIOGRAPHY and possible pci;  Surgeon: Callwood, Dwayne  D, MD;  Location: [facility] INVASIVE CV LAB;  Service: Cardiovascular;  Laterality: N/A;   LUMBAR LAMINECTOMY/DECOMPRESSION MICRODISCECTOMY Bilateral 07/29/2021   Procedure: BILATERAL LUMBAR ONE TO LUMBAR FIVE LAMINOTOMY, FORAMINOTOMY;  Surgeon: Mavis Purchase, MD;  Location[facility] OR;  Service: Neurosurgery;  Laterality: Bilateral;   SHOULDER ARTHROSCOPY W/ ROTATOR CUFF REPAIR Right    TEE WITHOUT CARDIOVERSION N/A 02/13/2017   Procedure: TRANSESOPHAGEAL ECHOCARDIOGRAM (TEE);  Surgeon: Lucas Dorise POUR, MD;  Location: [facility] OR;  Service: Open Heart Surgery;  Laterality: N/A;   XI ROBOTIC ASSISTED INGUINAL HERNIA  REPAIR WITH MESH Right 11/12/2020   Procedure: XI ROBOTIC ASSISTED INGUINAL HERNIA REPAIR WITH MESH;  Surgeon: Rodolph Romano, MD;  Location: [facility] ORS;  Service: General;  Laterality: Right;     Current Outpatient Medications  Medication Sig Dispense Refill   atorvastatin  (LIPITOR ) 80 MG tablet Take 80 mg by mouth daily.     cyclobenzaprine  (FLEXERIL ) 10 MG tablet Take 1 tablet (10 mg total) by mouth 3 (three) times daily as needed for muscle spasms. 30 tablet 0   DODEX  1000 MCG/ML injection Inject 1,000 mcg into the muscle every 14 (fourteen) days.     sertraline  (ZOLOFT ) 50 MG tablet Take 50 mg by mouth daily.     docusate sodium  (COLACE) 100 MG capsule Take 1 capsule (100 mg total) by mouth 2 (two) times daily. (Patient not taking: Reported on 01/12/2024) 10 capsule 0   No current facility-administered medications for this visit.    Allergies:   Bee venom    Social History:  The patient  reports that he quit smoking about 29 years ago. His smoking use included cigarettes. He started smoking about 69 years ago. He quit smokeless tobacco use about 11 years ago. He reports current alcohol use. He reports that he does not use drugs.   Family History:  The patient's family history includes Heart attack in his brother and father.    ROS:  Please see the history of present illness.   Otherwise, review of systems are positive for none.   All other systems are reviewed and negative.    PHYSICAL EXAM: VS:  BP 118/65 (BP Location: Left Arm, Patient Position: Sitting, Cuff Size: Normal)   Pulse (!) 101   Ht 5' 7.5 (1.715 m)   Wt 174 lb 4 oz (79 kg)   SpO2 95%   BMI 26.89 kg/m  , BMI Body mass index is 26.89 kg/m. GEN: Well nourished, well developed, in no acute distress  HEENT: normal  Neck: no JVD, carotid bruits, or masses Cardiac: Irregularly irregular; no murmurs, rubs, or gallops,no edema  Respiratory:  clear to auscultation bilaterally, normal work of breathing GI: soft,  nontender, nondistended, + BS MS: no deformity or atrophy  Skin: warm and dry.  Mild rash on the back Neuro:  Strength and sensation are intact Psych: euthymic mood, full affect   EKG:  EKG is ordered today. The ekg ordered today demonstrates:  Atrial fibrillation with rapid ventricular response When compared with ECG of 14-Feb-2017 07:23, Atrial fibrillation has replaced Sinus rhythm ST no longer elevated in Inferior leads ST no longer elevated in Anterolateral leads   Recent Labs: No results found for requested labs within last 365 days.    Lipid Panel    Component Value Date/Time   CHOL 150 02/12/2017 0527   TRIG 102 02/12/2017 0527   HDL 43 02/12/2017 0527   CHOLHDL 3.5 02/12/2017 0527   VLDL 20 02/12/2017 0527   LDLCALC 87 02/12/2017  0527      Wt Readings from Last 3 Encounters:  01/12/24 174 lb 4 oz (79 kg)  07/29/21 180 lb 1.9 oz (81.7 kg)  07/25/21 180 lb 4.8 oz (81.8 kg)         05/04/2017    1:39 PM  PAD Screen  Previous PAD dx? No  Previous surgical procedure? No  Pain with walking? No  Feet/toe relief with dangling? No  Painful, non-healing ulcers? No  Extremities discolored? No      ASSESSMENT AND PLAN:  1.  Persistent atrial fibrillation: Mild tachycardia.  This might be causing some of his symptoms. CHA2DS2/VAS is 4.  Recommend long-term anticoagulation. I started Eliquis  5 mg twice daily and will discontinue aspirin . I requested an echocardiogram and will follow-up after.  If he is still symptomatic, recommend proceeding with cardioversion.  However, the chance of being able to get him back in sinus rhythm might be lower than expected given that he has been in atrial fibrillation for likely at least 1 year. I reviewed his labs which were done in June and were overall unremarkable. I elected to stop Imdur  and add metoprolol  for rate control.   2. Coronary artery disease involving native coronary arteries: He has vague symptoms of fatigue and  decreased exercise tolerance.  This might be related to atrial fibrillation.  He might require further ischemic cardiac evaluation but will wait until after the echocardiogram.  3.  Hyperlipidemia: Continue high-dose atorvastatin .  I reviewed most recent lipid profile done in November which showed an LDL of 49 which is at target.    Disposition: Will obtain an echocardiogram and follow-up after to consider cardioversion.  Signed,  Deatrice Cage, MD  01/12/2024 8:35 AM    [facility]

## 2024-01-29 ENCOUNTER — Ambulatory Visit: Attending: Cardiovascular Disease

## 2024-01-29 DIAGNOSIS — I4891 Unspecified atrial fibrillation: Secondary | ICD-10-CM | POA: Diagnosis not present

## 2024-01-29 LAB — ECHOCARDIOGRAM COMPLETE
AR max vel: 2.43 cm2
AV Area VTI: 2.58 cm2
AV Area mean vel: 2.4 cm2
AV Mean grad: 2.8 mmHg
AV Peak grad: 5.8 mmHg
Ao pk vel: 1.2 m/s
S' Lateral: 3.62 cm

## 2024-02-04 ENCOUNTER — Telehealth: Payer: Self-pay | Admitting: Cardiovascular Disease

## 2024-02-04 NOTE — Telephone Encounter (Signed)
 Patient's wife calling to get echo results. Does not want to wait until October to get results.

## 2024-02-04 NOTE — Telephone Encounter (Signed)
 Reviewed that results are still pending and that we will call or send mychart message with results. She verbalized understanding with no further questions.

## 2024-02-05 ENCOUNTER — Ambulatory Visit: Payer: Self-pay | Admitting: Cardiovascular Disease

## 2024-02-05 ENCOUNTER — Telehealth: Payer: Self-pay | Admitting: Cardiovascular Disease

## 2024-02-05 NOTE — Telephone Encounter (Signed)
 Left a message for the patient to call back. We have openings on Monday 9/8 with APP.

## 2024-02-05 NOTE — Telephone Encounter (Signed)
 Patient and his wife report that patient had some chest pain last night. He took 2 aspirin  and it finally resolved. They want sooner appointment with Dr. Darron. Advised that we do not have anything sooner but that I could get them in to see APP as early as Monday. They both declined only wanting to see Dr. Darron.   They report that he has had a CABG in the past. Reviewed strict ED precautions and advised to go given his heart history. They also inquired about his echo results. Reviewed that is still pending his review and that we would call with those results.   Again expressed importance to follow up on his chest pain by going to ED. They both verbalized understanding and state they will go if it returns. She did inquire if Dr. Darron would know about this call and provided reassurance that it is sent to him for review. They both verbalized understanding of our conversation, recommendations, and had no further questions for now.

## 2024-02-05 NOTE — Telephone Encounter (Signed)
 Are they are earlier appointments with an APP?

## 2024-02-05 NOTE — Telephone Encounter (Signed)
   Pt c/o of Chest Pain: STAT if active CP, including tightness, pressure, jaw pain, radiating pain to shoulder/upper arm/back, CP unrelieved by Nitro. Symptoms reported of SOB, nausea, vomiting, sweating.  1. Are you having CP right now? no    2. Are you experiencing any other symptoms (ex. SOB, nausea, vomiting, sweating)? no   3. Is your CP continuous or coming and going? continuous   4. Have you taken Nitroglycerin ? Doesn't have but took aspirin    5. How long have you been experiencing CP? Last night    6. If NO CP at time of call then end call with telling Pt to call back or call 911 if Chest pain returns prior to return call from triage team.

## 2024-02-08 NOTE — Telephone Encounter (Signed)
 Left a message for the patient to call back for an appointment tomorrow with APP

## 2024-02-09 NOTE — Telephone Encounter (Signed)
 Left a message to call back for an appointment if anything further was needed. This is the third attempt.

## 2024-02-11 ENCOUNTER — Ambulatory Visit: Attending: Cardiovascular Disease | Admitting: Cardiovascular Disease

## 2024-02-11 ENCOUNTER — Encounter: Payer: Self-pay | Admitting: Cardiovascular Disease

## 2024-02-11 VITALS — BP 98/60 | HR 66 | Ht 68.0 in | Wt 176.0 lb

## 2024-02-11 DIAGNOSIS — R072 Precordial pain: Secondary | ICD-10-CM | POA: Diagnosis not present

## 2024-02-11 DIAGNOSIS — I25118 Atherosclerotic heart disease of native coronary artery with other forms of angina pectoris: Secondary | ICD-10-CM | POA: Diagnosis not present

## 2024-02-11 DIAGNOSIS — E785 Hyperlipidemia, unspecified: Secondary | ICD-10-CM

## 2024-02-11 DIAGNOSIS — I4811 Longstanding persistent atrial fibrillation: Secondary | ICD-10-CM

## 2024-02-11 MED ORDER — ATORVASTATIN CALCIUM 80 MG PO TABS: 80.0000 mg | ORAL_TABLET | Freq: Every day | ORAL | 3 refills | Status: DC

## 2024-02-11 NOTE — Patient Instructions (Signed)
 Medication Instructions:  No changes *If you need a refill on your cardiac medications before your next appointment, please call your pharmacy*  Lab Work: None ordered If you have labs (blood work) drawn today and your tests are completely normal, you will receive your results only by: MyChart Message (if you have MyChart) OR A paper copy in the mail If you have any lab test that is abnormal or we need to change your treatment, we will call you to review the results.  Testing/Procedures: Your provider has ordered a Lexiscan / Exercise Myoview  Stress test. This will take place at [facility]. Please report to the [facility] medical mall entrance. The volunteers at the first desk will direct you where to go.  [facility] MYOVIEW   Your provider has ordered a Stress Test with nuclear imaging. The purpose of this test is to evaluate the blood supply to your heart muscle. This procedure is referred to as a Non-Invasive Stress Test. This is because other than having an IV started in your vein, nothing is inserted or invades your body. Cardiac stress tests are done to find areas of poor blood flow to the heart by determining the extent of coronary artery disease (CAD). Some patients exercise on a treadmill, which naturally increases the blood flow to your heart, while others who are unable to walk on a treadmill due to physical limitations will have a pharmacologic/chemical stress agent called Lexiscan  . This medicine will mimic walking on a treadmill by temporarily increasing your coronary blood flow.   Please note: these test may take anywhere between 2-4 hours to complete  How to prepare for your Myoview  test:  Nothing to eat for 6 hours prior to the test No caffeine for 24 hours prior to test No smoking 24 hours prior to test. Your medication may be taken with water.  If your doctor stopped a medication because of this test, do not take that medication. No perfume, cologne or lotion.   PLEASE NOTIFY THE  OFFICE AT LEAST 24 HOURS IN ADVANCE IF YOU ARE UNABLE TO KEEP YOUR APPOINTMENT.  [phone] AND  PLEASE NOTIFY NUCLEAR MEDICINE AT [facility] AT LEAST 24 HOURS IN ADVANCE IF YOU ARE UNABLE TO KEEP YOUR APPOINTMENT. [phone]   Follow-Up: At [facility], you and your health needs are our priority.  As part of our continuing mission to provide you with exceptional heart care, our providers are all part of one team.  This team includes your primary Cardiologist (physician) and Advanced Practice Providers or APPs (Physician Assistants and Nurse Practitioners) who all work together to provide you with the care you need, when you need it.  Your next appointment:   3 month(s)  Provider:   You may see Deatrice Cage, MD or one of the following Advanced Practice Providers on your designated Care Team:   Lonni Meager, NP Lesley Maffucci, PA-C Bernardino Bring, PA-C Cadence Greenleaf, PA-C Tylene Lunch, NP Barnie Hila, NP    We recommend signing up for the patient portal called MyChart.  Sign up information is provided on this After Visit Summary.  MyChart is used to connect with patients for Virtual Visits (Telemedicine).  Patients are able to view lab/test results, encounter notes, upcoming appointments, etc.  Non-urgent messages can be sent to your provider as well.   To learn more about what you can do with MyChart, go to [email or web address].

## 2024-02-11 NOTE — Progress Notes (Signed)
 Cardiology Office Note   Date:  02/11/2024   ID:  Richard Rivas, DOB 1941/02/27, MRN [id number]  PCP:  Cleotilde Oneil FALCON, MD  Cardiologist:   Deatrice Cage, MD   Chief Complaint  Patient presents with   Follow-up    Follow up post echo c/o chest pain and wants to discuss Atorvastatin . Pt took 2  full strength aspirin . Meds reviewed verbally with pt.      History of Present Illness: Richard Rivas is a 83 y.o. male who presents for a follow-up visit regarding coronary artery disease . He has not been seen by me since 2023. The patient has known history of prior tobacco use and family history of coronary artery disease.  He had a small non-ST elevation myocardial infarction in September 2018.  Cardiac catheterization showed severe distal left main stenosis into the ostium of the LAD and left circumflex.  There was mild RCA disease.  Ejection fraction was normal.  The patient underwent CABG in September, 2018 with LIMA to LAD, SVG to left circumflex and SVG to PDA.  He had atypical chest pain in the past and was evaluated with a Lexiscan  Myoview  in July 2020 which showed no evidence of ischemia with normal ejection fraction.    He was seen last month and was noted to be in atrial fibrillation with RVR.  Reviewing his EKG, it seems that he had atrial fibrillation for about a year.  I started him on Eliquis  and switch Imdur  to metoprolol .  He had 1 recent episode of chest pain at night that lasted for 2 hours but he has hard time describing the episode.  No recurrent symptoms since then.  He tends to stay active and reports stable exertional dyspnea.  No issues with anticoagulation.   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)    a.) measured 2.0 cm on 08/03/2020   Aortic atherosclerosis (HCC)    BPPV (benign paroxysmal positional vertigo)    Colon polyps     Coronary artery disease    a.) s/p 3v CABG in 01/2017; LIMA-LAD, SVG-LCx, SVG-PDA   Depression    GERD (gastroesophageal reflux disease)    History of kidney stones    HLD (hyperlipidemia)    Hx of CABG 02/13/2017   3v; LIMA-LAD, SVG-LCx, SVG-PDA   Hypertension    Kidney stones    Myocardial infarction (HCC) 01/2017    Past Surgical History:  Procedure Laterality Date   CERVICAL DISC SURGERY     COLONOSCOPY WITH PROPOFOL  N/A 10/11/2015   Procedure: COLONOSCOPY WITH PROPOFOL ;  Surgeon: Lamar ONEIDA Holmes, MD;  Location: [facility] ENDOSCOPY;  Service: Endoscopy;  Laterality: N/A;   COLONOSCOPY WITH PROPOFOL  N/A 01/31/2019   Procedure: COLONOSCOPY WITH PROPOFOL ;  Surgeon: Toledo, Ladell POUR, MD;  Location: [facility] ENDOSCOPY;  Service: Gastroenterology;  Laterality: N/A;   CORONARY ARTERY BYPASS GRAFT N/A 02/13/2017   Procedure: CORONARY ARTERY BYPASS GRAFTING (CABG) x3 (LIMA to LAD, SVG to CIRCUMFLEX , SVG to PDA )  , using left internal mammary artery and right leg greater saphenous vein harvested endoscopically;  Surgeon: Lucas Dorise POUR, MD;  Location: [facility] OR;  Service: Open Heart Surgery;  Laterality: N/A;   KIDNEY STONE SURGERY     LAPAROSCOPIC RIGHT COLECTOMY Right 11/14/2014   Procedure: LAPAROSCOPIC RIGHT COLECTOMY;  Surgeon: Larinda Unknown Sharps, MD;  Location: [facility] ORS;  Service: General;  Laterality: Right;   LEFT HEART CATH AND CORONARY ANGIOGRAPHY N/A 02/12/2017   Procedure: LEFT HEART CATH AND CORONARY ANGIOGRAPHY and possible pci;  Surgeon: Florencio Cara BIRCH, MD;  Location: [facility] INVASIVE CV LAB;  Service: Cardiovascular;  Laterality: N/A;   LUMBAR LAMINECTOMY/DECOMPRESSION MICRODISCECTOMY Bilateral 07/29/2021   Procedure: BILATERAL LUMBAR ONE TO LUMBAR FIVE LAMINOTOMY, FORAMINOTOMY;  Surgeon: Mavis Purchase, MD;  Location: [facility] OR;  Service: Neurosurgery;  Laterality: Bilateral;   SHOULDER ARTHROSCOPY W/ ROTATOR CUFF REPAIR Right    TEE WITHOUT CARDIOVERSION N/A 02/13/2017    Procedure: TRANSESOPHAGEAL ECHOCARDIOGRAM (TEE);  Surgeon: Lucas Dorise POUR, MD;  Location: [facility] OR;  Service: Open Heart Surgery;  Laterality: N/A;   XI ROBOTIC ASSISTED INGUINAL HERNIA REPAIR WITH MESH Right 11/12/2020   Procedure: XI ROBOTIC ASSISTED INGUINAL HERNIA REPAIR WITH MESH;  Surgeon: Rodolph Romano, MD;  Location: [facility] ORS;  Service: General;  Laterality: Right;     Current Outpatient Medications  Medication Sig Dispense Refill   apixaban  (ELIQUIS ) 5 MG TABS tablet Take 1 tablet (5 mg total) by mouth 2 (two) times daily. 60 tablet 11   aspirin  EC 81 MG tablet Take 81 mg by mouth daily. Swallow whole.     atorvastatin  (LIPITOR ) 80 MG tablet Take 80 mg by mouth daily.     DODEX  1000 MCG/ML injection Inject 1,000 mcg into the muscle every 14 (fourteen) days.     metoprolol  tartrate (LOPRESSOR ) 25 MG tablet Take 1 tablet (25 mg total) by mouth 2 (two) times daily. 180 tablet 3   sertraline  (ZOLOFT ) 50 MG tablet Take 50 mg by mouth daily.     cyclobenzaprine  (FLEXERIL ) 10 MG tablet Take 1 tablet (10 mg total) by mouth 3 (three) times daily as needed for muscle spasms. (Patient not taking: Reported on 02/11/2024) 30 tablet 0   docusate sodium  (COLACE) 100 MG capsule Take 1 capsule (100 mg total) by mouth 2 (two) times daily. (Patient not taking: Reported on 02/11/2024) 10 capsule 0   No current facility-administered medications for this visit.    Allergies:   Bee venom    Social History:  The patient  reports that he quit smoking about 29 years ago. His smoking use included cigarettes. He started smoking about 69 years ago. He quit smokeless tobacco use about 11 years ago. He reports current alcohol use. He reports that he does not use drugs.   Family History:  The patient's family history includes Heart attack in his brother and father.    ROS:  Please see the history of present illness.   Otherwise, review of systems are positive for none.   All other systems are reviewed and  negative.    PHYSICAL EXAM: VS:  BP 98/60 (BP Location: Left Arm, Patient Position: Sitting, Cuff Size: Normal)   Pulse 66   Ht 5' 8 (1.727 m)   Wt 176 lb (79.8 kg)   SpO2 98%   BMI 26.76 kg/m  , BMI Body mass index is 26.76 kg/m. GEN: Well nourished, well developed, in no acute distress  HEENT: normal  Neck: no JVD, carotid bruits, or masses Cardiac: Irregularly irregular; no murmurs, rubs, or gallops,no edema  Respiratory:  clear to auscultation bilaterally, normal work of breathing GI: soft, nontender, nondistended, + BS MS: no deformity or atrophy  Skin: warm and dry.  Mild rash on the back Neuro:  Strength and sensation are intact Psych: euthymic mood, full affect   EKG:  EKG is  ordered today. The ekg ordered today demonstrates:  Atrial fibrillation When compared with ECG of 12-Jan-2024 08:10, Vent. rate has decreased BY  35 BPM     Recent Labs: No results found for requested labs within last 365 days.    Lipid Panel    Component Value Date/Time   CHOL 150 02/12/2017 0527   TRIG 102 02/12/2017 0527   HDL 43 02/12/2017 0527   CHOLHDL 3.5 02/12/2017 0527   VLDL 20 02/12/2017 0527   LDLCALC 87 02/12/2017 0527      Wt Readings from Last 3 Encounters:  02/11/24 176 lb (79.8 kg)  01/12/24 174 lb 4 oz (79 kg)  07/29/21 180 lb 1.9 oz (81.7 kg)         05/04/2017    1:39 PM  PAD Screen  Previous PAD dx? No  Previous surgical procedure? No  Pain with walking? No  Feet/toe relief with dangling? No  Painful, non-healing ulcers? No  Extremities discolored? No      ASSESSMENT AND PLAN:  1.  Persistent atrial fibrillation: Ventricular rate is now well-controlled with metoprolol  25 mg twice daily.  CHA2DS2/VAS is 4.  Continue long-term anticoagulation with Eliquis . Given that he has been in atrial fibrillation for 1 year and has minimal symptoms related to this, we will continue with rate control for now.   2. Coronary artery disease involving native  coronary arteries with atypical chest pain: I requested a Lexiscan  Myoview  for evaluation.  3.  Hyperlipidemia: Continue high-dose atorvastatin .  I reviewed most recent lipid profile done in November which showed an LDL of 49 which is at target.  He skips the dose sometimes but I discussed the importance of compliance.  I refilled atorvastatin .  4.  Mitral regurgitation: This was mild to moderate on recent echocardiogram.  Will continue to monitor clinically.    Disposition: Follow-up in 3 months.  Signed,  Deatrice Cage, MD  02/11/2024 10:27 AM    [facility]

## 2024-02-16 ENCOUNTER — Ambulatory Visit
Admission: RE | Admit: 2024-02-16 | Discharge: 2024-02-16 | Disposition: A | Discharge status: Home / Self Care | Source: Ambulatory Visit | Attending: Cardiovascular Disease | Admitting: Cardiovascular Disease

## 2024-02-16 ENCOUNTER — Other Ambulatory Visit: Payer: Self-pay | Admitting: Physician Assistant

## 2024-02-16 DIAGNOSIS — R072 Precordial pain: Secondary | ICD-10-CM | POA: Diagnosis not present

## 2024-02-16 MED ORDER — TECHNETIUM TC 99M TETROFOSMIN IV KIT
11.0500 | PACK | Freq: Once | INTRAVENOUS | Status: AC | PRN
  Administered 2024-02-16: 11.05 via INTRAVENOUS

## 2024-02-16 MED ORDER — TECHNETIUM TC 99M TETROFOSMIN IV KIT
31.0900 | PACK | Freq: Once | INTRAVENOUS | Status: AC | PRN
  Administered 2024-02-16: 31.09 via INTRAVENOUS

## 2024-02-16 MED ORDER — REGADENOSON 0.4 MG/5ML IV SOLN
0.4000 mg | Freq: Once | INTRAVENOUS | Status: AC
  Administered 2024-02-16: 0.4 mg via INTRAVENOUS

## 2024-02-16 NOTE — Progress Notes (Signed)
     Lynwood ONEIDA Hurst presented for a nuclear stress test today.  I Lesley LITTIE Maffucci, PA-C, provided direct supervision and was present during the stress portion of the study today, which was completed without significant symptoms, immediate complications, or acute ST/T changes on ECG.  Stress imaging is pending at this time.  Preliminary ECG findings may be listed in the chart, but the stress test result will not be finalized until perfusion imaging is complete.  Lesley LITTIE Maffucci, PA-C  02/16/2024, 10:54 AM

## 2024-02-17 LAB — NM MYOCAR MULTI W/SPECT W/WALL MOTION / EF
LV dias vol: 82 mL (ref 62–150)
LV sys vol: 28 mL (ref 4.2–5.8)
MPHR: 140 {beats}/min
Nuc Stress EF: 66 %
Peak HR: 101 {beats}/min
Percent HR: 72 %
Rest HR: 65 {beats}/min
Rest Nuclear Isotope Dose: 11.1 mCi
SDS: 1
SRS: 0
SSS: 2
ST Depression (mm): 0 mm
Stress Nuclear Isotope Dose: 31.1 mCi
TID: 1.02

## 2024-02-18 ENCOUNTER — Ambulatory Visit: Payer: Self-pay | Admitting: Cardiovascular Disease

## 2024-03-08 ENCOUNTER — Telehealth: Payer: Self-pay | Admitting: Cardiovascular Disease

## 2024-03-08 NOTE — Telephone Encounter (Signed)
 Pt is requesting a callback regarding her wanting to discuss pt stress test. Please advise

## 2024-03-10 NOTE — Telephone Encounter (Signed)
 Patient and wife made aware of results and verbalized understanding.

## 2024-03-10 NOTE — Telephone Encounter (Signed)
 Called the patient's wife, per the dpr. She requested that we call back.

## 2024-03-11 ENCOUNTER — Ambulatory Visit: Admitting: Cardiovascular Disease

## 2024-05-03 DIAGNOSIS — E538 Deficiency of other specified B group vitamins: Secondary | ICD-10-CM | POA: Diagnosis not present

## 2024-05-03 DIAGNOSIS — E1151 Type 2 diabetes mellitus with diabetic peripheral angiopathy without gangrene: Secondary | ICD-10-CM | POA: Diagnosis not present

## 2024-05-10 DIAGNOSIS — L814 Other melanin hyperpigmentation: Secondary | ICD-10-CM | POA: Diagnosis not present

## 2024-05-10 DIAGNOSIS — L821 Other seborrheic keratosis: Secondary | ICD-10-CM | POA: Diagnosis not present

## 2024-05-10 DIAGNOSIS — L720 Epidermal cyst: Secondary | ICD-10-CM | POA: Diagnosis not present

## 2024-05-10 DIAGNOSIS — Z85828 Personal history of other malignant neoplasm of skin: Secondary | ICD-10-CM | POA: Diagnosis not present

## 2024-05-10 DIAGNOSIS — L570 Actinic keratosis: Secondary | ICD-10-CM | POA: Diagnosis not present

## 2024-05-11 DIAGNOSIS — E1151 Type 2 diabetes mellitus with diabetic peripheral angiopathy without gangrene: Secondary | ICD-10-CM | POA: Diagnosis not present

## 2024-05-13 ENCOUNTER — Ambulatory Visit: Attending: Cardiovascular Disease | Admitting: Cardiovascular Disease

## 2024-05-13 ENCOUNTER — Encounter: Payer: Self-pay | Admitting: Cardiovascular Disease

## 2024-05-13 VITALS — BP 100/60 | HR 78 | Ht 68.0 in | Wt 179.0 lb

## 2024-05-13 DIAGNOSIS — E785 Hyperlipidemia, unspecified: Secondary | ICD-10-CM | POA: Diagnosis not present

## 2024-05-13 DIAGNOSIS — I340 Nonrheumatic mitral (valve) insufficiency: Secondary | ICD-10-CM | POA: Diagnosis not present

## 2024-05-13 DIAGNOSIS — I2510 Atherosclerotic heart disease of native coronary artery without angina pectoris: Secondary | ICD-10-CM | POA: Diagnosis not present

## 2024-05-13 DIAGNOSIS — I4891 Unspecified atrial fibrillation: Secondary | ICD-10-CM | POA: Diagnosis not present

## 2024-05-13 MED ORDER — RIVAROXABAN 20 MG PO TABS: 20.0000 mg | ORAL_TABLET | Freq: Every day | ORAL | 11 refills | Status: AC

## 2024-05-13 MED ORDER — ATORVASTATIN CALCIUM 40 MG PO TABS: 40.0000 mg | ORAL_TABLET | Freq: Every day | ORAL | 3 refills | Status: AC

## 2024-05-13 NOTE — Progress Notes (Signed)
 Cardiology Office Note   Date:  05/13/2024   ID:  Richard Rivas, DOB 17-Oct-1940, MRN [id number]  PCP:  Cleotilde Oneil FALCON, MD  Cardiologist:   Deatrice Cage, MD   Chief Complaint  Patient presents with   Follow-up    3 month f/u c/o sob when walking up hills. Pt d/c all medications due to stomach issues daily x1 wk. Meds reviewed verbally with pt.      History of Present Illness: Richard Rivas is a 83 y.o. male who presents for a follow-up visit regarding coronary artery disease . He has not been seen by me since 2023. The patient has known history of prior tobacco use and family history of coronary artery disease.  He had a small non-ST elevation myocardial infarction in September 2018.  Cardiac catheterization showed severe distal left main stenosis into the ostium of the LAD and left circumflex.  There was mild RCA disease.  Ejection fraction was normal.  The patient underwent CABG in September, 2018 with LIMA to LAD, SVG to left circumflex and SVG to PDA.  He had atypical chest pain in the past and was evaluated with a Lexiscan  Myoview  in July 2020 which showed no evidence of ischemia with normal ejection fraction.    He was seen in August and was noted to be in atrial fibrillation with RVR.  Reviewing his EKG, it seems that he had atrial fibrillation for about a year.  I started him on Eliquis  and switched Imdur  to metoprolol .  He recently started having abdominal pain and diarrhea.  He felt that this was due to medications that he takes.  Due to that, he stopped taking all his medications and reports improvement in symptoms.  No chest pain or palpitations.  He does report exertional dyspnea but that has been stable.  He reported atypical chest pain in September and thus underwent a Lexiscan  Myoview  that showed no evidence of ischemia.  There was a small fixed lateral defect likely representing an artifact.   Past Medical History:  Diagnosis Date   AAA (abdominal aortic aneurysm)  without rupture    a.) measured 3.0 cm on 08/03/2020   Aneurysm of left common iliac artery    a.) measured 2.0 cm on 08/03/2020   Aortic atherosclerosis    BPPV (benign paroxysmal positional vertigo)    Colon polyps    Coronary artery disease    a.) s/p 3v CABG in 01/2017; LIMA-LAD, SVG-LCx, SVG-PDA   Depression    GERD (gastroesophageal reflux disease)    History of kidney stones    HLD (hyperlipidemia)    Hx of CABG 02/13/2017   3v; LIMA-LAD, SVG-LCx, SVG-PDA   Hypertension    Kidney stones    Myocardial infarction (HCC) 01/2017    Past Surgical History:  Procedure Laterality Date   CERVICAL DISC SURGERY     COLONOSCOPY WITH PROPOFOL  N/A 10/11/2015   Procedure: COLONOSCOPY WITH PROPOFOL ;  Surgeon: Lamar ONEIDA Holmes, MD;  Location: [facility] ENDOSCOPY;  Service: Endoscopy;  Laterality: N/A;   COLONOSCOPY WITH PROPOFOL  N/A 01/31/2019   Procedure: COLONOSCOPY WITH PROPOFOL ;  Surgeon: Toledo, Ladell POUR, MD;  Location: [facility] ENDOSCOPY;  Service: Gastroenterology;  Laterality: N/A;   CORONARY ARTERY BYPASS GRAFT N/A 02/13/2017   Procedure: CORONARY ARTERY BYPASS GRAFTING (CABG) x3 (LIMA to LAD, SVG to CIRCUMFLEX , SVG to PDA )  , using left internal mammary artery and right leg greater saphenous vein harvested endoscopically;  Surgeon: Lucas Dorise POUR, MD;  Location:  [facility] OR;  Service: Open Heart Surgery;  Laterality: N/A;   KIDNEY STONE SURGERY     LAPAROSCOPIC RIGHT COLECTOMY Right 11/14/2014   Procedure: LAPAROSCOPIC RIGHT COLECTOMY;  Surgeon: Larinda Unknown Sharps, MD;  Location: [facility] ORS;  Service: General;  Laterality: Right;   LEFT HEART CATH AND CORONARY ANGIOGRAPHY N/A 02/12/2017   Procedure: LEFT HEART CATH AND CORONARY ANGIOGRAPHY and possible pci;  Surgeon: Florencio Cara BIRCH, MD;  Location: [facility] INVASIVE CV LAB;  Service: Cardiovascular;  Laterality: N/A;   LUMBAR LAMINECTOMY/DECOMPRESSION MICRODISCECTOMY Bilateral 07/29/2021   Procedure: BILATERAL LUMBAR ONE TO LUMBAR FIVE  LAMINOTOMY, FORAMINOTOMY;  Surgeon: Mavis Purchase, MD;  Location: [facility] OR;  Service: Neurosurgery;  Laterality: Bilateral;   SHOULDER ARTHROSCOPY W/ ROTATOR CUFF REPAIR Right    TEE WITHOUT CARDIOVERSION N/A 02/13/2017   Procedure: TRANSESOPHAGEAL ECHOCARDIOGRAM (TEE);  Surgeon: Lucas Dorise POUR, MD;  Location: [facility] OR;  Service: Open Heart Surgery;  Laterality: N/A;   XI ROBOTIC ASSISTED INGUINAL HERNIA REPAIR WITH MESH Right 11/12/2020   Procedure: XI ROBOTIC ASSISTED INGUINAL HERNIA REPAIR WITH MESH;  Surgeon: Rodolph Romano, MD;  Location: [facility] ORS;  Service: General;  Laterality: Right;     Current Outpatient Medications  Medication Sig Dispense Refill   aspirin  EC 81 MG tablet Take 81 mg by mouth daily. Swallow whole.     apixaban  (ELIQUIS ) 5 MG TABS tablet Take 1 tablet (5 mg total) by mouth 2 (two) times daily. (Patient not taking: Reported on 05/13/2024) 60 tablet 11   atorvastatin  (LIPITOR ) 80 MG tablet Take 1 tablet (80 mg total) by mouth daily. (Patient not taking: Reported on 05/13/2024) 90 tablet 3   cyclobenzaprine  (FLEXERIL ) 10 MG tablet Take 1 tablet (10 mg total) by mouth 3 (three) times daily as needed for muscle spasms. (Patient not taking: Reported on 05/13/2024) 30 tablet 0   docusate sodium  (COLACE) 100 MG capsule Take 1 capsule (100 mg total) by mouth 2 (two) times daily. (Patient not taking: Reported on 05/13/2024) 10 capsule 0   DODEX  1000 MCG/ML injection Inject 1,000 mcg into the muscle every 14 (fourteen) days.     metoprolol  tartrate (LOPRESSOR ) 25 MG tablet Take 1 tablet (25 mg total) by mouth 2 (two) times daily. (Patient not taking: Reported on 05/13/2024) 180 tablet 3   sertraline  (ZOLOFT ) 50 MG tablet Take 50 mg by mouth daily. (Patient not taking: Reported on 05/13/2024)     No current facility-administered medications for this visit.    Allergies:   Bee venom    Social History:  The patient  reports that he quit smoking about 29 years ago. His  smoking use included cigarettes. He started smoking about 69 years ago. He quit smokeless tobacco use about 11 years ago. He reports current alcohol use. He reports that he does not use drugs.   Family History:  The patient's family history includes Heart attack in his brother and father.    ROS:  Please see the history of present illness.   Otherwise, review of systems are positive for none.   All other systems are reviewed and negative.    PHYSICAL EXAM: VS:  BP 100/60 (BP Location: Left Arm, Patient Position: Sitting, Cuff Size: Normal)   Pulse 78   Ht 5' 8 (1.727 m)   Wt 179 lb (81.2 kg)   SpO2 96%   BMI 27.22 kg/m  , BMI Body mass index is 27.22 kg/m. GEN: Well nourished, well developed, in no acute distress  HEENT: normal  Neck: no  JVD, carotid bruits, or masses Cardiac: Irregularly irregular; no murmurs, rubs, or gallops,no edema  Respiratory:  clear to auscultation bilaterally, normal work of breathing GI: soft, nontender, nondistended, + BS MS: no deformity or atrophy  Skin: warm and dry.  Mild rash on the back Neuro:  Strength and sensation are intact Psych: euthymic mood, full affect   EKG:  EKG is ordered today. The ekg ordered today demonstrates: Atrial fibrillation When compared with ECG of 11-Feb-2024 10:17, No significant change was found       Recent Labs: No results found for requested labs within last 365 days.    Lipid Panel    Component Value Date/Time   CHOL 150 02/12/2017 0527   TRIG 102 02/12/2017 0527   HDL 43 02/12/2017 0527   CHOLHDL 3.5 02/12/2017 0527   VLDL 20 02/12/2017 0527   LDLCALC 87 02/12/2017 0527      Wt Readings from Last 3 Encounters:  05/13/24 179 lb (81.2 kg)  02/11/24 176 lb (79.8 kg)  01/12/24 174 lb 4 oz (79 kg)         05/04/2017    1:39 PM  PAD Screen  Previous PAD dx? No  Previous surgical procedure? No  Pain with walking? No  Feet/toe relief with dangling? No  Painful, non-healing ulcers? No   Extremities discolored? No      ASSESSMENT AND PLAN:  1.  Persistent atrial fibrillation: The patient stopped all his medications due to GI symptoms.  His ventricular rate seems to be controlled without any medication.  It is really difficult to know what caused his symptoms and whether it was medications or something else.  CHA2DS2/VAS is 4.  I again discussed with him the need for long-term anticoagulation and elected to switch him to Xarelto 20 mg once daily.  2. Coronary artery disease involving native coronary arteries without angina: No recurrent chest pain.  Nuclear stress test in September showed no clear ischemia.  3.  Hyperlipidemia: I asked him to resume atorvastatin  but will use a lower dose of 40 mg once daily.  4.  Mitral regurgitation: This was mild to moderate on recent echocardiogram.  Will continue to monitor clinically.    Disposition: Follow-up in 6 months.  Signed,  Deatrice Cage, MD  05/13/2024 9:19 AM    [facility]

## 2024-05-13 NOTE — Patient Instructions (Signed)
 Medication Instructions:  STOP the Aspirin   START Xarelto 20 mg once daily with food TAKE Atorvastatin  40 mg once daily  *If you need a refill on your cardiac medications before your next appointment, please call your pharmacy*  Lab Work: None ordered If you have labs (blood work) drawn today and your tests are completely normal, you will receive your results only by: MyChart Message (if you have MyChart) OR A paper copy in the mail If you have any lab test that is abnormal or we need to change your treatment, we will call you to review the results.  Testing/Procedures: None ordered  Follow-Up: At [facility], you and your health needs are our priority.  As part of our continuing mission to provide you with exceptional heart care, our providers are all part of one team.  This team includes your primary Cardiologist (physician) and Advanced Practice Providers or APPs (Physician Assistants and Nurse Practitioners) who all work together to provide you with the care you need, when you need it.  Your next appointment:   6 month(s)  Provider:   You may see Deatrice Cage, MD or one of the following Advanced Practice Providers on your designated Care Team:   Lonni Meager, NP Lesley Maffucci, PA-C Bernardino Bring, PA-C Cadence [place], PA-C Tylene Lunch, NP Barnie Hila, NP    We recommend signing up for the patient portal called MyChart.  Sign up information is provided on this After Visit Summary.  MyChart is used to connect with patients for Virtual Visits (Telemedicine).  Patients are able to view lab/test results, encounter notes, upcoming appointments, etc.  Non-urgent messages can be sent to your provider as well.   To learn more about what you can do with MyChart, go to [email or web address].

## 2024-11-15 ENCOUNTER — Ambulatory Visit: Admitting: Cardiovascular Disease
